# Patient Record
Sex: Female | Born: 1955 | Race: White | Hispanic: No | Marital: Married | Smoking: Never smoker
Health system: Southern US, Community
[De-identification: ages and names within clinical notes are randomized; demographics above are authoritative.]

## PROBLEM LIST (undated history)

## (undated) DIAGNOSIS — K219 Gastro-esophageal reflux disease without esophagitis: Secondary | ICD-10-CM

## (undated) DIAGNOSIS — Z8042 Family history of malignant neoplasm of prostate: Secondary | ICD-10-CM

## (undated) DIAGNOSIS — Z87442 Personal history of urinary calculi: Secondary | ICD-10-CM

## (undated) DIAGNOSIS — IMO0001 Reserved for inherently not codable concepts without codable children: Secondary | ICD-10-CM

## (undated) DIAGNOSIS — D649 Anemia, unspecified: Secondary | ICD-10-CM

## (undated) DIAGNOSIS — Z86010 Personal history of colonic polyps: Secondary | ICD-10-CM

## (undated) DIAGNOSIS — Z801 Family history of malignant neoplasm of trachea, bronchus and lung: Secondary | ICD-10-CM

## (undated) DIAGNOSIS — N200 Calculus of kidney: Secondary | ICD-10-CM

## (undated) DIAGNOSIS — N840 Polyp of corpus uteri: Secondary | ICD-10-CM

## (undated) DIAGNOSIS — K589 Irritable bowel syndrome without diarrhea: Secondary | ICD-10-CM

## (undated) DIAGNOSIS — G4730 Sleep apnea, unspecified: Secondary | ICD-10-CM

## (undated) DIAGNOSIS — Z8052 Family history of malignant neoplasm of bladder: Secondary | ICD-10-CM

## (undated) DIAGNOSIS — N393 Stress incontinence (female) (male): Secondary | ICD-10-CM

## (undated) DIAGNOSIS — R0600 Dyspnea, unspecified: Secondary | ICD-10-CM

## (undated) DIAGNOSIS — J310 Chronic rhinitis: Secondary | ICD-10-CM

## (undated) DIAGNOSIS — Z800 Family history of malignant neoplasm of digestive organs: Secondary | ICD-10-CM

## (undated) DIAGNOSIS — F329 Major depressive disorder, single episode, unspecified: Secondary | ICD-10-CM

## (undated) DIAGNOSIS — T7840XA Allergy, unspecified, initial encounter: Secondary | ICD-10-CM

## (undated) DIAGNOSIS — G4733 Obstructive sleep apnea (adult) (pediatric): Secondary | ICD-10-CM

## (undated) DIAGNOSIS — E785 Hyperlipidemia, unspecified: Secondary | ICD-10-CM

## (undated) DIAGNOSIS — R011 Cardiac murmur, unspecified: Secondary | ICD-10-CM

## (undated) DIAGNOSIS — N939 Abnormal uterine and vaginal bleeding, unspecified: Secondary | ICD-10-CM

## (undated) DIAGNOSIS — Z860101 Personal history of adenomatous and serrated colon polyps: Secondary | ICD-10-CM

## (undated) DIAGNOSIS — Z803 Family history of malignant neoplasm of breast: Secondary | ICD-10-CM

## (undated) DIAGNOSIS — R0609 Other forms of dyspnea: Secondary | ICD-10-CM

## (undated) DIAGNOSIS — F32A Depression, unspecified: Secondary | ICD-10-CM

## (undated) DIAGNOSIS — Z87898 Personal history of other specified conditions: Secondary | ICD-10-CM

## (undated) DIAGNOSIS — R569 Unspecified convulsions: Secondary | ICD-10-CM

## (undated) DIAGNOSIS — F419 Anxiety disorder, unspecified: Secondary | ICD-10-CM

## (undated) DIAGNOSIS — Z531 Procedure and treatment not carried out because of patient's decision for reasons of belief and group pressure: Secondary | ICD-10-CM

## (undated) DIAGNOSIS — I10 Essential (primary) hypertension: Secondary | ICD-10-CM

## (undated) DIAGNOSIS — R32 Unspecified urinary incontinence: Secondary | ICD-10-CM

## (undated) DIAGNOSIS — Z9989 Dependence on other enabling machines and devices: Secondary | ICD-10-CM

## (undated) DIAGNOSIS — R519 Headache, unspecified: Secondary | ICD-10-CM

## (undated) DIAGNOSIS — N950 Postmenopausal bleeding: Secondary | ICD-10-CM

## (undated) DIAGNOSIS — I440 Atrioventricular block, first degree: Secondary | ICD-10-CM

## (undated) HISTORY — DX: Family history of malignant neoplasm of bladder: Z80.52

## (undated) HISTORY — DX: Family history of malignant neoplasm of trachea, bronchus and lung: Z80.1

## (undated) HISTORY — DX: Family history of malignant neoplasm of digestive organs: Z80.0

## (undated) HISTORY — DX: Gastro-esophageal reflux disease without esophagitis: K21.9

## (undated) HISTORY — DX: Allergy, unspecified, initial encounter: T78.40XA

## (undated) HISTORY — DX: Cardiac murmur, unspecified: R01.1

## (undated) HISTORY — DX: Hyperlipidemia, unspecified: E78.5

## (undated) HISTORY — PX: CHOLECYSTECTOMY: SHX55

## (undated) HISTORY — DX: Anxiety disorder, unspecified: F41.9

## (undated) HISTORY — DX: Abnormal uterine and vaginal bleeding, unspecified: N93.9

## (undated) HISTORY — DX: Unspecified convulsions: R56.9

## (undated) HISTORY — DX: Family history of malignant neoplasm of breast: Z80.3

## (undated) HISTORY — PX: COLON SURGERY: SHX602

## (undated) HISTORY — DX: Depression, unspecified: F32.A

## (undated) HISTORY — PX: APPENDECTOMY: SHX54

## (undated) HISTORY — DX: Calculus of kidney: N20.0

## (undated) HISTORY — DX: Anemia, unspecified: D64.9

## (undated) HISTORY — DX: Sleep apnea, unspecified: G47.30

## (undated) HISTORY — DX: Major depressive disorder, single episode, unspecified: F32.9

## (undated) HISTORY — PX: CYSTOSCOPY/RETROGRADE/URETEROSCOPY: SHX5316

## (undated) HISTORY — PX: LAPAROSCOPIC ILEOCECECTOMY: SHX5898

## (undated) HISTORY — DX: Family history of malignant neoplasm of prostate: Z80.42

## (undated) HISTORY — DX: Irritable bowel syndrome, unspecified: K58.9

## (undated) HISTORY — PX: VENTRAL HERNIA REPAIR: SHX424

## (undated) HISTORY — PX: HERNIA REPAIR: SHX51

## (undated) HISTORY — DX: Unspecified urinary incontinence: R32

## (undated) HISTORY — PX: CYSTOSCOPY/URETEROSCOPY/HOLMIUM LASER/STENT PLACEMENT: SHX6546

---
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
## 1986-01-13 HISTORY — PX: OVARIAN CYST SURGERY: SHX726

## 1998-08-31 ENCOUNTER — Ambulatory Visit: Admission: RE | Admit: 1998-08-31 | Discharge: 1998-08-31 | Payer: Self-pay | Admitting: Internal Medicine

## 2008-02-01 ENCOUNTER — Ambulatory Visit (HOSPITAL_COMMUNITY): Admission: RE | Admit: 2008-02-01 | Discharge: 2008-02-01 | Payer: Self-pay | Admitting: Urology

## 2010-04-29 LAB — BASIC METABOLIC PANEL
BUN: 12 mg/dL (ref 6–23)
CO2: 21 mEq/L (ref 19–32)
Chloride: 110 mEq/L (ref 96–112)
Glucose, Bld: 80 mg/dL (ref 70–99)
Potassium: 4.7 mEq/L (ref 3.5–5.1)

## 2010-04-29 LAB — HCG, QUANTITATIVE, PREGNANCY: hCG, Beta Chain, Quant, S: <2 m[IU]/mL (ref <5)

## 2010-05-28 NOTE — Consult Note (Signed)
NAME:  Sherri Howard, BELFIORE NO.:  [id number]   MEDICAL RECORD NO.:  [id number]          PATIENT TYPE:  AMB   LOCATION:  DAY                           FACILITY:  [facility]   PHYSICIAN:  Ky Barban, M.D.DATE OF BIRTH:  10/28/1955   DATE OF CONSULTATION:  DATE OF DISCHARGE:                                 CONSULTATION   A 55 year old female is coming tomorrow to have a right ureteral stone  basket done under anesthesia as outpatient.   HISTORY:  This patient is 55 year old was seen initially by me in  December 14, 2007 with history of right renal colic.  A CT scan was done  on December 7 and it shows there is no upper ureteral calculi but there  is a 7 mm calculus in the right kidney with multiple small renal  calculi.  Apparently she had ESL done previously and these are stones  left from that ESL.  When she presented with right renal colic and the  CT which was done on December 20, 2007 the pelvic CT, showed there is a 3  x 6 mm stone in the right ureterovesical junction.  The patient has  recurrent urinary tract infection with positive urine cultures.  At this  time the urine culture were positive also.  Put her on Cipro and  subsequently she called and she is having itching because of Cipro.  I  have discontinued that, put her on Macrobid 1 twice a day and when she  comes on for the stone basket on January 19 we will give her  prophylactic antibiotic of gentamicin and Ancef.  I have told her that  we need to go ahead and basket the stone and she understands the  procedure, limitations, complications, especially use of double-J stent,  need for additional procedure, possible open surgery.   PAST MEDICAL HISTORY:  She has hypertension.  No diabetes ESL right  kidney 2 years ago.   FAMILY HISTORY:  Father had bladder cancer and mother had breast cancer.   PERSONAL HISTORY:  Does not smoke or drink.   REVIEW OF SYSTEMS:  Unremarkable.   EXAMINATION:   CONSTITUTIONAL:  Moderately built, not in acute distress.  VITAL SIGNS:  Blood pressure 166/103, temperature 98.4.  CENTRAL NERVOUS SYSTEM:  Negative.  ENT:  Negative.  CHEST:  Symmetrical.  HEART:  Regular sinus rhythm, no murmur.  ABDOMINAL:  Soft, flat.  Liver, spleen, kidneys are not palpable.  1+  right CVA tenderness.  PELVIC:  No adnexal mass or tenderness.   IMPRESSION:  1. Right distal ureteral calculus.  2. Right renal calculi.   PLAN:  Cystoscopy, right retrograde pyelogram, ureteroscopic stone  basket extraction, holmium laser lithotripsy, insertion double-J stent  under anesthesia.  It will be done as outpatient, but when she comes to  the holding area, she need to have a preop antibiotic gentamicin 80 mg  and an 7 grams IV.   ALLERGIES:  The patient is allergic to SULFA DRUGS, CIPRO and VISTARIL.      Ky Barban, M.D.  Electronically Signed     MIJ/MEDQ  D:  01/31/2008  T:  01/31/2008  Job:  [id number]

## 2010-05-28 NOTE — Op Note (Signed)
NAME:  Sherri Howard, Sherri Howard              ACCOUNT NO.:  [id number]   MEDICAL RECORD NO.:  [id number]          PATIENT TYPE:  AMB   LOCATION:  DAY                           FACILITY:  [facility]   PHYSICIAN:  Ky Barban, M.D.DATE OF BIRTH:  [birth year]   DATE OF PROCEDURE:  DATE OF DISCHARGE:                               OPERATIVE REPORT   PREOPERATIVE DIAGNOSIS:  Right ureteral calculus.   POSTOPERATIVE DIAGNOSIS:  No ureteral calculus.   PROCEDURES:  Cystoscopy, right retrograde pyelogram, and right  ureteroscopy.   ANESTHESIA:  General endotracheal.   PROCEDURE:  The patient under general endotracheal anesthesia in  lithotomy position, prepped and draped.  A #25 cystoscope introduced  into the bladder.  The bladder looked normal and right ureteral orifice  looked normal.  It was catheterized with the wedge catheter.  Hypaque  was injected under fluoroscopic control.  The dye went up into the upper  ureter without any obstruction or any filling defect.  The ureter was of  normal caliber.  The collecting system looked normal and it looked to me  that she did not have any stone, but I decided to into the  ureterovesical junction, so guidewire was passed up into the renal  pelvis.  Over the guidewire, short rigid ureteroscope was introduced  without dilating the ureter, I could look into the intramural ureter and  the distal ureter, there was no stone.  All the instruments and  guidewire were removed.  The patient left the operating room in  satisfactory condition.      Ky Barban, M.D.  Electronically Signed     MIJ/MEDQ  D:  02/01/2008  T:  02/02/2008  Job:  [id number]

## 2012-01-14 HISTORY — PX: LAPAROSCOPIC CHOLECYSTECTOMY: SUR755

## 2012-07-21 DIAGNOSIS — D126 Benign neoplasm of colon, unspecified: Secondary | ICD-10-CM | POA: Insufficient documentation

## 2012-07-21 DIAGNOSIS — Z87442 Personal history of urinary calculi: Secondary | ICD-10-CM | POA: Insufficient documentation

## 2012-09-07 DIAGNOSIS — Z9049 Acquired absence of other specified parts of digestive tract: Secondary | ICD-10-CM | POA: Insufficient documentation

## 2014-02-10 ENCOUNTER — Encounter: Payer: Self-pay | Admitting: Internal Medicine

## 2014-06-16 DIAGNOSIS — K432 Incisional hernia without obstruction or gangrene: Secondary | ICD-10-CM | POA: Insufficient documentation

## 2014-07-31 DIAGNOSIS — Z8719 Personal history of other diseases of the digestive system: Secondary | ICD-10-CM | POA: Insufficient documentation

## 2014-07-31 DIAGNOSIS — Z9889 Other specified postprocedural states: Secondary | ICD-10-CM | POA: Insufficient documentation

## 2014-07-31 HISTORY — DX: Personal history of other diseases of the digestive system: Z87.19

## 2014-08-22 ENCOUNTER — Encounter (HOSPITAL_COMMUNITY): Payer: Self-pay | Admitting: Emergency Medicine

## 2014-08-22 ENCOUNTER — Emergency Department (HOSPITAL_COMMUNITY)
Admission: EM | Admit: 2014-08-22 | Discharge: 2014-08-23 | Disposition: A | Discharge status: Home / Self Care | Payer: BLUE CROSS/BLUE SHIELD | Attending: Emergency Medicine | Admitting: Emergency Medicine

## 2014-08-22 ENCOUNTER — Emergency Department (HOSPITAL_COMMUNITY): Payer: BLUE CROSS/BLUE SHIELD

## 2014-08-22 DIAGNOSIS — T380X1A Poisoning by glucocorticoids and synthetic analogues, accidental (unintentional), initial encounter: Secondary | ICD-10-CM | POA: Diagnosis not present

## 2014-08-22 DIAGNOSIS — I10 Essential (primary) hypertension: Secondary | ICD-10-CM | POA: Insufficient documentation

## 2014-08-22 DIAGNOSIS — Z91040 Latex allergy status: Secondary | ICD-10-CM | POA: Insufficient documentation

## 2014-08-22 DIAGNOSIS — Y929 Unspecified place or not applicable: Secondary | ICD-10-CM | POA: Insufficient documentation

## 2014-08-22 DIAGNOSIS — Y999 Unspecified external cause status: Secondary | ICD-10-CM | POA: Insufficient documentation

## 2014-08-22 DIAGNOSIS — Y939 Activity, unspecified: Secondary | ICD-10-CM | POA: Insufficient documentation

## 2014-08-22 DIAGNOSIS — T887XXA Unspecified adverse effect of drug or medicament, initial encounter: Secondary | ICD-10-CM

## 2014-08-22 HISTORY — DX: Essential (primary) hypertension: I10

## 2014-08-22 LAB — CBC WITH DIFFERENTIAL/PLATELET
BASOS PCT: 0 % (ref 0–1)
Basophils Absolute: 0 10*3/uL (ref 0.0–0.1)
EOS PCT: 0 % (ref 0–5)
Eosinophils Absolute: 0 10*3/uL (ref 0.0–0.7)
HCT: 41.9 % (ref 36.0–46.0)
Hemoglobin: 13.9 g/dL (ref 12.0–15.0)
Lymphocytes Relative: 12 % (ref 12–46)
Lymphs Abs: 1.7 10*3/uL (ref 0.7–4.0)
MCH: 29.4 pg (ref 26.0–34.0)
MCHC: 33.2 g/dL (ref 30.0–36.0)
MCV: 88.6 fL (ref 78.0–100.0)
Monocytes Absolute: 1 10*3/uL (ref 0.1–1.0)
Monocytes Relative: 7 % (ref 3–12)
NEUTROS ABS: 11.8 10*3/uL — AB (ref 1.7–7.7)
NEUTROS PCT: 81 % — AB (ref 43–77)
PLATELETS: 405 10*3/uL — AB (ref 150–400)
RBC: 4.73 MIL/uL (ref 3.87–5.11)
RDW: 13.6 % (ref 11.5–15.5)
WBC: 14.4 10*3/uL — AB (ref 4.0–10.5)

## 2014-08-22 NOTE — ED Notes (Signed)
Pt states she zoned out and husband states she had an episode of shaking.

## 2014-08-22 NOTE — ED Provider Notes (Signed)
CSN: [id number]     Arrival date & time 08/22/14  2309 History   First MD Initiated Contact with Patient 08/22/14 2314     Chief Complaint  Patient presents with  . Seizures     (Consider location/radiation/quality/duration/timing/severity/associated sxs/prior Treatment) HPI Comments: Patient is a 59 year old female presents to the emergency department by EMS after having "disowned out".  The history is obtained from the husband, EMS, and the patient.  The history is given that the patient was sitting in the room with her husband they were conversing when the patient "zoned out" and then had some shaking.  The patient states that at this time she is not hurting. She is aware of where she is, as well as the date, and who she is. She denies any headache or any pain of any kind. She denies any chest pain or palpitations. She denies any unusual weakness of extremities. She states that she has not had any high fever recently. It is of note that she recently had hernia surgery. She states however she has not really had any problems related to the surgery. His been no recent chills or noted fever. No recent vomiting or diarrhea. Patient denies any injury to the head or any accident.  Patient is a 59 y.o. female presenting with seizures. The history is provided by the patient.  Seizures   Past Medical History  Diagnosis Date  . Hypertension    Past Surgical History  Procedure Laterality Date  . Hernia repair     History reviewed. No pertinent family history. History  Substance Use Topics  . Smoking status: Never Smoker   . Smokeless tobacco: Not on file  . Alcohol Use: No   OB History    No data available     Review of Systems  Constitutional: Negative for activity change.       All ROS Neg except as noted in HPI  HENT: Negative for nosebleeds.   Eyes: Negative for photophobia and discharge.  Respiratory: Negative for cough, shortness of breath and wheezing.   Cardiovascular:  Negative for chest pain and palpitations.  Gastrointestinal: Negative for abdominal pain and blood in stool.  Genitourinary: Negative for dysuria, frequency and hematuria.  Musculoskeletal: Negative for back pain, arthralgias and neck pain.  Skin: Negative.   Neurological: Negative for dizziness, seizures and speech difficulty.  Psychiatric/Behavioral: Negative for hallucinations and confusion.      Allergies  Latex and Sulfa antibiotics  Home Medications   Prior to Admission medications   Not on File   BP 140/74 mmHg  Pulse 97  Temp(Src) 98.9 F (37.2 C) (Oral)  Resp 20  Ht 5\' 5"  (1.651 m)  Wt 240 lb (108.863 kg)  BMI 39.94 kg/m2  SpO2 95% Physical Exam  Constitutional: She is oriented to person, place, and time. She appears well-developed and well-nourished.  Non-toxic appearance.  HENT:  Head: Normocephalic.  Right Ear: Tympanic membrane and external ear normal.  Left Ear: Tympanic membrane and external ear normal.  Eyes: EOM and lids are normal. Pupils are equal, round, and reactive to light.  Neck: Normal range of motion. Neck supple. Carotid bruit is not present.  Cardiovascular: Normal rate, regular rhythm, normal heart sounds, intact distal pulses and normal pulses.   Pulmonary/Chest: Breath sounds normal. No respiratory distress.  Abdominal: Soft. Bowel sounds are normal. There is no guarding.  There is a dressing over the surgical site. There is an abdominal binder also present.  Musculoskeletal: Normal range of motion.  Lymphadenopathy:       Head (right side): No submandibular adenopathy present.       Head (left side): No submandibular adenopathy present.    She has no cervical adenopathy.  Neurological: She is alert and oriented to person, place, and time. She has normal strength. No cranial nerve deficit or sensory deficit.  Skin: Skin is warm and dry.  Psychiatric: She has a normal mood and affect. Her speech is normal. Judgment and thought content  normal.  Nursing note and vitals reviewed.   ED Course  Procedures (including critical care time) Labs Review Labs Reviewed  URINALYSIS, ROUTINE W REFLEX MICROSCOPIC (NOT AT [facility])  COMPREHENSIVE METABOLIC PANEL  TROPONIN I  CBC WITH DIFFERENTIAL/PLATELET    Imaging Review No results found.   EKG Interpretation None      MDM  Vital signs are well within normal limits. Urinalysis shows a small leukocyte esterase, with 11-20 WBCs present. Many bacteria, and many squamous cells present. The lactic acid was negative at 1.7. The components of metabolic panel showed no acute changes. The troponin was less than 0.03, negative for acute cardiac event. The white blood cell count was elevated at 14,400. The patient has been on stimulant medication over the last 3 days. The hemoglobin and hematocrit were steady. There was no noted shift to the left. The chest x-ray showed the lungs to be hyperexpanded, but otherwise negative. Review of the surgical dressing and surgical site showed no evidence of any major infection.  I suspect the symptoms are probably related to an adverse reaction to prednisone stimulant. I have asked the patient to stop sterile now. And to discuss this with her primary physician. I further instructed the patient and the patient's husband to return immediately if any changes, problems, or concerns. Questions were answered. The patient is in agreement with this discharge plan.    Final diagnoses:  None    *I have reviewed nursing notes, vital signs, and all appropriate lab and imaging results for this patient.Lily Kocher, PA-C 08/24/14 [place], MD 08/26/14 2356

## 2014-08-23 LAB — URINALYSIS, ROUTINE W REFLEX MICROSCOPIC
BILIRUBIN URINE: NEGATIVE
GLUCOSE, UA: NEGATIVE mg/dL
KETONES UR: NEGATIVE mg/dL
Nitrite: NEGATIVE
PROTEIN: NEGATIVE mg/dL
SPECIFIC GRAVITY, URINE: 1.015 (ref 1.005–1.030)
Urobilinogen, UA: 0.2 mg/dL (ref 0.0–1.0)
pH: 6.5 (ref 5.0–8.0)

## 2014-08-23 LAB — COMPREHENSIVE METABOLIC PANEL
ALBUMIN: 4 g/dL (ref 3.5–5.0)
ALT: 37 U/L (ref 14–54)
AST: 26 U/L (ref 15–41)
Alkaline Phosphatase: 75 U/L (ref 38–126)
Anion gap: 10 (ref 5–15)
BUN: 19 mg/dL (ref 6–20)
CHLORIDE: 105 mmol/L (ref 101–111)
CO2: 20 mmol/L — ABNORMAL LOW (ref 22–32)
Calcium: 9.3 mg/dL (ref 8.9–10.3)
Creatinine, Ser: 0.95 mg/dL (ref 0.44–1.00)
GFR calc Af Amer: >60 mL/min (ref >60)
Glucose, Bld: 128 mg/dL — ABNORMAL HIGH (ref 65–99)
Potassium: 4 mmol/L (ref 3.5–5.1)
SODIUM: 135 mmol/L (ref 135–145)
Total Bilirubin: 0.2 mg/dL — ABNORMAL LOW (ref 0.3–1.2)
Total Protein: 7.2 g/dL (ref 6.5–8.1)

## 2014-08-23 LAB — TROPONIN I

## 2014-08-23 LAB — URINE MICROSCOPIC-ADD ON

## 2014-08-23 LAB — LACTIC ACID, PLASMA: Lactic Acid, Venous: 1.7 mmol/L (ref 0.5–2.0)

## 2014-08-23 NOTE — Discharge Instructions (Signed)
Your vital signs are within normal limits. Your lab test are non-acute at this time. A culture of your urine will be sent to the lab for additional testiing. Suspect adverse reaction to you steroid medication.  Please stop your prednisone for now. Please discuss today's symptoms with your MD as soon as possible. Please return to the ED if any changes or concerns.

## 2014-08-23 NOTE — ED Notes (Signed)
Pt alert & oriented x4, stable gait. Patient  given discharge instructions, paperwork & prescription(s).  Patient verbalized understanding. Pt left department in wheelchair w/ no further questions. 

## 2014-08-23 NOTE — ED Notes (Signed)
Pt ambulated to restroom & returned to room w/ no complications. 

## 2014-12-20 DIAGNOSIS — Z6841 Body Mass Index (BMI) 40.0 and over, adult: Secondary | ICD-10-CM | POA: Insufficient documentation

## 2014-12-20 DIAGNOSIS — E739 Lactose intolerance, unspecified: Secondary | ICD-10-CM | POA: Insufficient documentation

## 2014-12-20 DIAGNOSIS — R32 Unspecified urinary incontinence: Secondary | ICD-10-CM | POA: Insufficient documentation

## 2014-12-20 DIAGNOSIS — G4700 Insomnia, unspecified: Secondary | ICD-10-CM | POA: Insufficient documentation

## 2014-12-20 DIAGNOSIS — I10 Essential (primary) hypertension: Secondary | ICD-10-CM | POA: Insufficient documentation

## 2014-12-20 DIAGNOSIS — Z9989 Dependence on other enabling machines and devices: Secondary | ICD-10-CM

## 2014-12-20 DIAGNOSIS — G4733 Obstructive sleep apnea (adult) (pediatric): Secondary | ICD-10-CM | POA: Insufficient documentation

## 2014-12-20 DIAGNOSIS — F411 Generalized anxiety disorder: Secondary | ICD-10-CM | POA: Insufficient documentation

## 2014-12-20 DIAGNOSIS — F339 Major depressive disorder, recurrent, unspecified: Secondary | ICD-10-CM | POA: Insufficient documentation

## 2014-12-20 DIAGNOSIS — E785 Hyperlipidemia, unspecified: Secondary | ICD-10-CM | POA: Insufficient documentation

## 2014-12-20 DIAGNOSIS — E559 Vitamin D deficiency, unspecified: Secondary | ICD-10-CM | POA: Insufficient documentation

## 2014-12-20 DIAGNOSIS — K589 Irritable bowel syndrome without diarrhea: Secondary | ICD-10-CM | POA: Insufficient documentation

## 2015-03-02 DIAGNOSIS — R0609 Other forms of dyspnea: Secondary | ICD-10-CM | POA: Insufficient documentation

## 2015-03-02 DIAGNOSIS — R0600 Dyspnea, unspecified: Secondary | ICD-10-CM | POA: Insufficient documentation

## 2015-03-02 DIAGNOSIS — R109 Unspecified abdominal pain: Secondary | ICD-10-CM | POA: Insufficient documentation

## 2015-04-23 DIAGNOSIS — G4733 Obstructive sleep apnea (adult) (pediatric): Secondary | ICD-10-CM | POA: Diagnosis not present

## 2015-05-03 DIAGNOSIS — H35363 Drusen (degenerative) of macula, bilateral: Secondary | ICD-10-CM | POA: Diagnosis not present

## 2015-05-23 DIAGNOSIS — G4733 Obstructive sleep apnea (adult) (pediatric): Secondary | ICD-10-CM | POA: Diagnosis not present

## 2015-06-04 ENCOUNTER — Encounter: Payer: Self-pay | Admitting: Family Medicine

## 2015-06-04 ENCOUNTER — Ambulatory Visit (INDEPENDENT_AMBULATORY_CARE_PROVIDER_SITE_OTHER): Payer: BLUE CROSS/BLUE SHIELD | Admitting: Family Medicine

## 2015-06-04 ENCOUNTER — Ambulatory Visit (INDEPENDENT_AMBULATORY_CARE_PROVIDER_SITE_OTHER): Payer: BLUE CROSS/BLUE SHIELD

## 2015-06-04 VITALS — BP 133/74 | HR 98 | Temp 101.2°F | Ht 65.0 in | Wt 260.0 lb

## 2015-06-04 DIAGNOSIS — R0600 Dyspnea, unspecified: Secondary | ICD-10-CM | POA: Diagnosis not present

## 2015-06-04 DIAGNOSIS — R509 Fever, unspecified: Secondary | ICD-10-CM | POA: Diagnosis not present

## 2015-06-04 DIAGNOSIS — F329 Major depressive disorder, single episode, unspecified: Secondary | ICD-10-CM

## 2015-06-04 DIAGNOSIS — N10 Acute tubulo-interstitial nephritis: Secondary | ICD-10-CM

## 2015-06-04 DIAGNOSIS — F32A Depression, unspecified: Secondary | ICD-10-CM

## 2015-06-04 LAB — MICROSCOPIC EXAMINATION: WBC, UA: >30 /hpf — AB (ref 0–?)

## 2015-06-04 LAB — URINALYSIS, COMPLETE
BILIRUBIN UA: NEGATIVE
GLUCOSE, UA: NEGATIVE
NITRITE UA: NEGATIVE
SPEC GRAV UA: 1.01 (ref 1.005–1.030)
UUROB: 0.2 mg/dL (ref 0.2–1.0)
pH, UA: 5.5 (ref 5.0–7.5)

## 2015-06-04 MED ORDER — LEVOFLOXACIN 500 MG PO TABS: 500.0000 mg | ORAL_TABLET | Freq: Every day | ORAL | Status: DC

## 2015-06-04 NOTE — Progress Notes (Signed)
Subjective:  [name], female    DOB: 1955-01-28  Age: 60 y.o. MRN: [id number]  CC: Establish Care and Fever   HPI Sherri Howard presents for  Fever of 101-102 intermittently for the last 14 days. She's had temporary relief with Tylenol and Advil. However the fever yesterday when up to 102.7. She feels achy and weak all over. She has no specific symptoms to localize the temperature. The one exception is that she has noted some loss of control of her urination. For this she takes oxybutynin once or twice daily for her bladder. She has a history of kidney stones. She is also having some low back pain. It is in a band across the back which she describes as just a pain. She denies any shortness. There is no cramp to it. It is equal bilaterally. It is 5-6/10. It has not affected her activity level. It has been accompanied by some shortness of breath. However she has had no cough. She has no history of COPD or asthma. She is a nonsmoker. She denies any upper respiratory symptoms such as earache sore throat congestion rhinorrhea. Of note is that she does have a long history of depression for many years. She was recently taken off of her Prozac and Wellbutrin and placed on Viibryd. She mentions having missed a couple of days last week and it caused her to have a shocklike sensation in her head. This resolved once going back on the [place]. She takes Abilify 2 mg daily. She says it doesn't seem to be helping anymore for her depression. She denies a history of schizophrenia and manic depressive disorder etc. She is pretty sure the reason that she was placed on this medicine was for her chronic depression. She has chronic abdominal discomfort that is temporarily relieved by hyoscyamine sublingual tablets. It has not been worse than usual recently. But it is intermittently active.  She mentions also that she uses CPAP for sleep apnea. She notes that when she's been asleep she's felt like she is was  trying to wake up but physically could not move her body. Some unclear exactly how long this lasts for her.  Depression screen [facility] 2/9 06/04/2015  Decreased Interest 0  Down, Depressed, Hopeless 1  PHQ - 2 Score 1    No flowsheet data found.     History Aili has a past medical history of Hypertension and Kidney stones.   She has past surgical history that includes Hernia repair; Tonsillectomy (05/1973); Cholecystectomy; and Appendectomy.   Her family history includes Alzheimer's disease in her father; Aneurysm in her mother; Cancer in her father and mother; Hypertension in her father.She reports that she has never smoked. She does not have any smokeless tobacco history on file. She reports that she does not drink alcohol or use illicit drugs.  No current outpatient prescriptions on file prior to visit.   No current facility-administered medications on file prior to visit.    ROS Review of Systems  Constitutional: Negative for fever, activity change and appetite change.  HENT: Negative for congestion, rhinorrhea and sore throat.   Eyes: Negative for visual disturbance.  Respiratory: Negative for cough and shortness of breath.   Cardiovascular: Negative for chest pain and palpitations.  Gastrointestinal: Negative for nausea, abdominal pain and diarrhea.  Genitourinary: Negative for dysuria.  Musculoskeletal: Negative for myalgias and arthralgias.    Objective:  BP 133/74 mmHg  Pulse 98  Temp(Src) 101.2 F (38.4 C) (Oral)  Ht  _0  (1.651 m)  Wt 260 lb (117.935 kg)  BMI 43.27 kg/m2  SpO2 98%  Physical Exam  Constitutional: She is oriented to person, place, and time. She appears well-developed and well-nourished. No distress.  HENT:  Head: Normocephalic and atraumatic.  Right Ear: External ear normal.  Left Ear: External ear normal.  Nose: Nose normal.  Mouth/Throat: Oropharynx is clear and moist.  Eyes: Conjunctivae and EOM are normal. Pupils are equal, round, and  reactive to light.  Neck: Normal range of motion. Neck supple. No thyromegaly present.  Cardiovascular: Normal rate, regular rhythm and normal heart sounds.   No murmur heard. Pulmonary/Chest: Effort normal and breath sounds normal. No respiratory distress. She has no wheezes. She has no rales.  Abdominal: Soft. Bowel sounds are normal. She exhibits no distension. There is no tenderness.  Lymphadenopathy:    She has no cervical adenopathy.  Neurological: She is alert and oriented to person, place, and time. She has normal reflexes.  Skin: Skin is warm and dry.  Psychiatric: She has a normal mood and affect. Her behavior is normal. Judgment and thought content normal.    Assessment & Plan:   Anglea was seen today for establish care and fever.  Diagnoses and all orders for this visit:  Acute pyelonephritis -     US Renal; Future  Fever, unspecified fever cause -     Urinalysis, Complete -     CBC with Differential/Platelet -     CMP14+EGFR -     Sedimentation rate -     TSH + free T4 -     Amylase -     Lipase -     DG Abd 2 Views; Future -     DG Chest 2 View; Future -     PR BREATHING CAPACITY TEST -     US Renal; Future  Dyspnea -     US Renal; Future  Depression -     US Renal; Future  Other orders -     levofloxacin (LEVAQUIN) 500 MG tablet; Take 1 tablet (500 mg total) by mouth daily. For 10 days -     Microscopic Examination   I am having Ms. Clodfelter start on levofloxacin. I am also having her maintain her ARIPiprazole, Cholecalciferol, folic acid, HYDROcodone-acetaminophen, hyoscyamine, losartan, PRENATAL LOW IRON, Vilazodone HCl, diltiazem, Co Q-10, docusate sodium, magnesium oxide, ALPRAZolam, and oxybutynin.  Meds ordered this encounter  Medications  . ARIPiprazole (ABILIFY) 2 MG tablet    Sig: Take 2 mg by mouth daily.   Marland Kitchen DISCONTD: ALPRAZolam (XANAX) 1 MG tablet    Sig: Take by mouth.  . Cholecalciferol (D-5000) 5000 units TABS    Sig: Take 5,000 Units  by mouth daily.   . folic acid (FOLVITE) 1 MG tablet    Sig: Take 1 mg by mouth daily.   Marland Kitchen HYDROcodone-acetaminophen (NORCO) 10-325 MG tablet    Sig: Take 1 tablet by mouth every 6 (six) hours as needed.   . hyoscyamine (LEVSIN SL) 0.125 MG SL tablet    Sig: Take 0.125 mg by mouth every 6 (six) hours as needed.   Marland Kitchen losartan (COZAAR) 50 MG tablet    Sig: Take 50 mg by mouth daily.   Marland Kitchen DISCONTD: oxybutynin (DITROPAN-XL) 10 MG 24 hr tablet    Sig: Take by mouth.  . Prenatal Vit-Fe Fumarate-FA (PRENATAL LOW IRON) 27-0.8 MG TABS    Sig: Take 1 capsule by mouth daily.   . Vilazodone HCl (VIIBRYD) 10 &  20 & 40 MG KIT    Sig: Take 20 mg by mouth daily.   Marland Kitchen diltiazem (CARDIZEM CD) 240 MG 24 hr capsule    Sig: Take 240 mg by mouth daily.   . Coenzyme Q10 (CO Q-10) 100 MG CAPS    Sig: Take 100 mg by mouth.  . docusate sodium (COLACE) 100 MG capsule    Sig: Take 100 mg by mouth daily as needed.   . magnesium oxide (MAG-OX) 400 MG tablet    Sig: Take 400 mg by mouth daily.   Marland Kitchen ALPRAZolam (XANAX) 1 MG tablet    Sig: Take 1 mg by mouth 3 (three) times daily as needed.   Marland Kitchen oxybutynin (DITROPAN-XL) 10 MG 24 hr tablet    Sig: Take 10 mg by mouth at bedtime.   Marland Kitchen levofloxacin (LEVAQUIN) 500 MG tablet    Sig: Take 1 tablet (500 mg total) by mouth daily. For 10 days    Dispense:  10 tablet    Refill:  0     Follow-up: Return in about 1 month (around 07/05/2015).  Claretta Fraise, M.D.

## 2015-06-05 ENCOUNTER — Encounter: Payer: Self-pay | Admitting: Family Medicine

## 2015-06-05 LAB — CBC WITH DIFFERENTIAL/PLATELET
BASOS: 0 %
Basophils Absolute: 0 10*3/uL (ref 0.0–0.2)
EOS (ABSOLUTE): 0.1 10*3/uL (ref 0.0–0.4)
EOS: 0 %
HEMATOCRIT: 36.4 % (ref 34.0–46.6)
HEMOGLOBIN: 12.3 g/dL (ref 11.1–15.9)
Immature Grans (Abs): 0 10*3/uL (ref 0.0–0.1)
Immature Granulocytes: 0 %
LYMPHS ABS: 1.7 10*3/uL (ref 0.7–3.1)
Lymphs: 13 %
MCH: 29.3 pg (ref 26.6–33.0)
MCHC: 33.8 g/dL (ref 31.5–35.7)
MCV: 87 fL (ref 79–97)
MONOCYTES: 12 %
MONOS ABS: 1.5 10*3/uL — AB (ref 0.1–0.9)
Neutrophils Absolute: 9.4 10*3/uL — ABNORMAL HIGH (ref 1.4–7.0)
Neutrophils: 75 %
Platelets: 311 10*3/uL (ref 150–379)
RBC: 4.2 x10E6/uL (ref 3.77–5.28)
RDW: 14.2 % (ref 12.3–15.4)
WBC: 12.6 10*3/uL — ABNORMAL HIGH (ref 3.4–10.8)

## 2015-06-05 LAB — CMP14+EGFR
A/G RATIO: 1.4 (ref 1.2–2.2)
ALBUMIN: 4.2 g/dL (ref 3.5–5.5)
ALT: 20 IU/L (ref 0–32)
AST: 13 IU/L (ref 0–40)
Alkaline Phosphatase: 79 IU/L (ref 39–117)
BUN/Creatinine Ratio: 11 (ref 9–23)
BUN: 14 mg/dL (ref 6–24)
Bilirubin Total: 0.5 mg/dL (ref 0.0–1.2)
CALCIUM: 9.4 mg/dL (ref 8.7–10.2)
CO2: 21 mmol/L (ref 18–29)
CREATININE: 1.24 mg/dL — AB (ref 0.57–1.00)
Chloride: 99 mmol/L (ref 96–106)
GFR, EST AFRICAN AMERICAN: 56 mL/min/{1.73_m2} — AB (ref >59)
GFR, EST NON AFRICAN AMERICAN: 48 mL/min/{1.73_m2} — AB (ref >59)
GLOBULIN, TOTAL: 2.9 g/dL (ref 1.5–4.5)
Glucose: 117 mg/dL — ABNORMAL HIGH (ref 65–99)
POTASSIUM: 4.7 mmol/L (ref 3.5–5.2)
SODIUM: 139 mmol/L (ref 134–144)
TOTAL PROTEIN: 7.1 g/dL (ref 6.0–8.5)

## 2015-06-05 LAB — SEDIMENTATION RATE: Sed Rate: 9 mm/hr (ref 0–40)

## 2015-06-05 LAB — LIPASE: Lipase: 10 U/L (ref 0–59)

## 2015-06-05 LAB — TSH+FREE T4
Free T4: 0.89 ng/dL (ref 0.82–1.77)
TSH: 3.06 u[IU]/mL (ref 0.450–4.500)

## 2015-06-05 LAB — AMYLASE: AMYLASE: 28 U/L — AB (ref 31–124)

## 2015-06-08 ENCOUNTER — Telehealth: Payer: Self-pay | Admitting: Family Medicine

## 2015-06-08 NOTE — Telephone Encounter (Signed)
Patient is no longer running a fever, but still weaks.  Will call back Tuesday if she is not feeling any better.   Patient aware to call back sooner if needed

## 2015-06-12 ENCOUNTER — Telehealth: Payer: Self-pay | Admitting: Family Medicine

## 2015-06-13 ENCOUNTER — Telehealth: Payer: Self-pay | Admitting: Family Medicine

## 2015-06-13 ENCOUNTER — Other Ambulatory Visit: Payer: Self-pay | Admitting: Family Medicine

## 2015-06-13 ENCOUNTER — Ambulatory Visit (HOSPITAL_COMMUNITY)
Admission: RE | Admit: 2015-06-13 | Discharge: 2015-06-13 | Disposition: A | Discharge status: Home / Self Care | Payer: BLUE CROSS/BLUE SHIELD | Source: Ambulatory Visit | Attending: Family Medicine | Admitting: Family Medicine

## 2015-06-13 ENCOUNTER — Encounter: Payer: Self-pay | Admitting: Family Medicine

## 2015-06-13 DIAGNOSIS — R0600 Dyspnea, unspecified: Secondary | ICD-10-CM

## 2015-06-13 DIAGNOSIS — F329 Major depressive disorder, single episode, unspecified: Secondary | ICD-10-CM | POA: Insufficient documentation

## 2015-06-13 DIAGNOSIS — F32A Depression, unspecified: Secondary | ICD-10-CM

## 2015-06-13 DIAGNOSIS — N10 Acute tubulo-interstitial nephritis: Secondary | ICD-10-CM

## 2015-06-13 DIAGNOSIS — N1330 Unspecified hydronephrosis: Secondary | ICD-10-CM | POA: Diagnosis not present

## 2015-06-13 DIAGNOSIS — R509 Fever, unspecified: Secondary | ICD-10-CM | POA: Insufficient documentation

## 2015-06-13 DIAGNOSIS — N281 Cyst of kidney, acquired: Secondary | ICD-10-CM | POA: Insufficient documentation

## 2015-06-13 DIAGNOSIS — N1339 Other hydronephrosis: Secondary | ICD-10-CM

## 2015-06-13 NOTE — Telephone Encounter (Signed)
Patient called and LM Thursday evening and this test is 06/13/15 in the morning  I think it will be too late to add this on

## 2015-06-18 DIAGNOSIS — R109 Unspecified abdominal pain: Secondary | ICD-10-CM | POA: Diagnosis not present

## 2015-06-18 DIAGNOSIS — N1330 Unspecified hydronephrosis: Secondary | ICD-10-CM | POA: Insufficient documentation

## 2015-06-18 DIAGNOSIS — N132 Hydronephrosis with renal and ureteral calculous obstruction: Secondary | ICD-10-CM | POA: Diagnosis not present

## 2015-06-18 DIAGNOSIS — Z87442 Personal history of urinary calculi: Secondary | ICD-10-CM | POA: Diagnosis not present

## 2015-06-23 DIAGNOSIS — G4733 Obstructive sleep apnea (adult) (pediatric): Secondary | ICD-10-CM | POA: Diagnosis not present

## 2015-06-25 ENCOUNTER — Telehealth: Payer: Self-pay | Admitting: Family Medicine

## 2015-06-25 NOTE — Telephone Encounter (Signed)
Patient called stating that she has been seeing a urologist.  Urology did a CT scan that showed Kidney Stones.  Patient states at night she has had a rapid heart beat, dyspnea and weakness with light exertion.

## 2015-06-26 ENCOUNTER — Ambulatory Visit (INDEPENDENT_AMBULATORY_CARE_PROVIDER_SITE_OTHER): Payer: BLUE CROSS/BLUE SHIELD | Admitting: Family Medicine

## 2015-06-26 ENCOUNTER — Encounter: Payer: Self-pay | Admitting: Family Medicine

## 2015-06-26 VITALS — BP 131/78 | HR 72 | Temp 97.4°F | Ht 65.0 in | Wt 260.0 lb

## 2015-06-26 DIAGNOSIS — R002 Palpitations: Secondary | ICD-10-CM

## 2015-06-26 MED ORDER — VILAZODONE HCL 10 MG PO TABS: 10.0000 mg | ORAL_TABLET | Freq: Every day | ORAL | Status: DC

## 2015-06-26 NOTE — Progress Notes (Signed)
Subjective:  [name], female    DOB: 06/13/1955  Age: 60 y.o. MRN: [id number]  CC: Palpitations and Fatigue   HPI Sherri Howard presents for  Palpitations lasting several minutes. Can occur with nausea. Denies chest pain. She does get short of breath. She describes episodes starting several months ago that have been increasing in frequency and severity and lasting longer. She came in today because of an incident 2 days ago when she was taking her shower. She started feeling nauseous and had to sit down when the palpitations started. She tried again 4 times in fact before she was able to complete her shower due to palpitations. Nausea persisting as well. She denies chest pain but she was short of breath. She just felt terrible all over. She notes that this generally happens at bedtime. However it has happened throughout the day as well. She mentions also that she uses CPAP for sleep apnea. She notes that when she's been asleep she's felt like she is was trying to wak  Depression screen [facility] 2/9 06/26/2015 06/04/2015  Decreased Interest 0 0  Down, Depressed, Hopeless 0 1  PHQ - 2 Score 0 1    GAD 7 : Generalized Anxiety Score 06/26/2015  Nervous, Anxious, on Edge 0  Control/stop worrying 0  Worry too much - different things 0  Trouble relaxing 0  Restless 0  Easily annoyed or irritable 0  Afraid - awful might happen 0  Total GAD 7 Score 0       History Sherri Howard has a past medical history of Hypertension and Kidney stones.   She has past surgical history that includes Hernia repair; Tonsillectomy (05/1973); Cholecystectomy; and Appendectomy.   Her family history includes Alzheimer's disease in her father; Aneurysm in her mother; Cancer in her father and mother; Hypertension in her father.She reports that she has never smoked. She does not have any smokeless tobacco history on file. She reports that she does not drink alcohol or use illicit drugs.  Current Outpatient  Prescriptions on File Prior to Visit  Medication Sig Dispense Refill  . ALPRAZolam (XANAX) 1 MG tablet Take 1 mg by mouth 3 (three) times daily as needed.     . diltiazem (CARDIZEM CD) 240 MG 24 hr capsule Take 240 mg by mouth daily.     Marland Kitchen losartan (COZAAR) 50 MG tablet Take 50 mg by mouth daily.      No current facility-administered medications on file prior to visit.    ROS Review of Systems  Constitutional: Negative for fever, activity change and appetite change.  HENT: Negative for congestion, rhinorrhea and sore throat.   Eyes: Negative for visual disturbance.  Respiratory: Positive for shortness of breath. Negative for cough.   Cardiovascular: Positive for palpitations. Negative for chest pain and leg swelling.  Gastrointestinal: Negative for nausea, abdominal pain and diarrhea.  Genitourinary: Negative for dysuria.  Musculoskeletal: Negative for myalgias and arthralgias.    Objective:  BP 131/78 mmHg  Pulse 72  Temp(Src) 97.4 F (36.3 C) (Oral)  Ht 5\' 5"  (1.651 m)  Wt 260 lb (117.935 kg)  BMI 43.27 kg/m2  SpO2 97%  Physical Exam  Constitutional: She is oriented to person, place, and time. She appears well-developed and well-nourished. No distress.  HENT:  Head: Normocephalic and atraumatic.  Right Ear: External ear normal.  Left Ear: External ear normal.  Nose: Nose normal.  Mouth/Throat: Oropharynx is clear and moist.  Eyes: Conjunctivae and EOM are normal. Pupils  are equal, round, and reactive to light.  Neck: Normal range of motion. Neck supple. No thyromegaly present.  Cardiovascular: Normal rate, regular rhythm and normal heart sounds.   No murmur heard. Pulmonary/Chest: Effort normal and breath sounds normal. No respiratory distress. She has no wheezes. She has no rales.  Abdominal: Soft. Bowel sounds are normal. She exhibits no distension. There is no tenderness.  Lymphadenopathy:    She has no cervical adenopathy.  Neurological: She is alert and oriented  to person, place, and time. She has normal reflexes.  Skin: Skin is warm and dry.  Psychiatric: She has a normal mood and affect. Her behavior is normal. Judgment and thought content normal.    Assessment & Plan:   Sherri Howard was seen today for palpitations and fatigue.  Diagnoses and all orders for this visit:  Palpitations -     EKG 12-Lead -     Holter monitor - 48 hour -     Holter monitor - 24 hour; Future -     Holter monitor - 24 hour  Other orders -     Vilazodone HCl (VIIBRYD) 10 MG TABS; Take 1 tablet (10 mg total) by mouth daily.   I have discontinued Sherri Howard's ARIPiprazole, Cholecalciferol, folic acid, HYDROcodone-acetaminophen, hyoscyamine, PRENATAL LOW IRON, Vilazodone HCl, Co Q-10, docusate sodium, magnesium oxide, oxybutynin, and levofloxacin. I am also having her start on Vilazodone HCl. Additionally, I am having her maintain her losartan, diltiazem, ALPRAZolam, and tamsulosin.  Meds ordered this encounter  Medications  . tamsulosin (FLOMAX) 0.4 MG CAPS capsule    Sig: Take 0.4 mg by mouth.  . Vilazodone HCl (VIIBRYD) 10 MG TABS    Sig: Take 1 tablet (10 mg total) by mouth daily.    Dispense:  30 tablet    Refill:  0     Medication List       This list is accurate as of: 06/26/15  8:01 PM.  Always use your most recent med list.               ALPRAZolam 1 MG tablet  Commonly known as:  XANAX  Take 1 mg by mouth 3 (three) times daily as needed.     diltiazem 240 MG 24 hr capsule  Commonly known as:  CARDIZEM CD  Take 240 mg by mouth daily.     FLOMAX 0.4 MG Caps capsule  Generic drug:  tamsulosin  Take 0.4 mg by mouth.     losartan 50 MG tablet  Commonly known as:  COZAAR  Take 50 mg by mouth daily.     Vilazodone HCl 10 MG Tabs  Commonly known as:  VIIBRYD  Take 1 tablet (10 mg total) by mouth daily.         Follow-up: Return in about 2 weeks (around 07/10/2015).  Claretta Fraise, M.D.

## 2015-06-27 ENCOUNTER — Encounter: Payer: Self-pay | Admitting: Family Medicine

## 2015-06-28 ENCOUNTER — Encounter: Payer: Self-pay | Admitting: Family Medicine

## 2015-06-28 ENCOUNTER — Telehealth: Payer: Self-pay | Admitting: Family Medicine

## 2015-06-28 NOTE — Telephone Encounter (Signed)
Pt stopped taking xanax and cut her viibryd in half as recommended at visit the other day. She is feeling more anxious. Should she go back on the xanax or go back to taking a whole tablet of the viibryd.

## 2015-06-28 NOTE — Telephone Encounter (Signed)
She was not told to DC xanax, I merely explained the risks of its use and asked that she work on tapering. Stopping too quickly could lead to withdrawal. She should resume at two tablets a day. Continue to take viibryd as is.

## 2015-06-29 NOTE — Telephone Encounter (Signed)
Spoke to pt and advised of MD feedback and pt voiced understanding. 

## 2015-07-04 ENCOUNTER — Telehealth: Payer: Self-pay | Admitting: Family Medicine

## 2015-07-10 DIAGNOSIS — G4733 Obstructive sleep apnea (adult) (pediatric): Secondary | ICD-10-CM | POA: Diagnosis not present

## 2015-07-23 DIAGNOSIS — G4733 Obstructive sleep apnea (adult) (pediatric): Secondary | ICD-10-CM | POA: Diagnosis not present

## 2015-07-26 DIAGNOSIS — G4733 Obstructive sleep apnea (adult) (pediatric): Secondary | ICD-10-CM | POA: Diagnosis not present

## 2015-07-27 ENCOUNTER — Encounter: Payer: Self-pay | Admitting: Family Medicine

## 2015-07-30 ENCOUNTER — Telehealth: Payer: Self-pay | Admitting: Family Medicine

## 2015-07-30 NOTE — Telephone Encounter (Signed)
lmtcb

## 2015-07-31 ENCOUNTER — Ambulatory Visit: Payer: BLUE CROSS/BLUE SHIELD | Admitting: Urology

## 2015-08-01 ENCOUNTER — Telehealth: Payer: Self-pay | Admitting: Family Medicine

## 2015-08-01 NOTE — Telephone Encounter (Signed)
Had a blockage back in may - seen a specialist and she is uncertain that she needs a UROLOGY APPT  Now ????  She seems better now. Please address and call pt on Thursday.

## 2015-08-03 NOTE — Telephone Encounter (Signed)
Her Kidney had been blocked so urine flow from kidney to bladder on that side was restricted. If she prefers, A CT scan could be done instead. (CT Urogram.) If the blockage has cleared she wouldn't need to see urology.

## 2015-08-03 NOTE — Telephone Encounter (Signed)
Per pt, she saw Dr Lupita Leash in June He is seeing pt for this Does not need new referral

## 2015-08-09 ENCOUNTER — Encounter: Payer: Self-pay | Admitting: Pediatrics

## 2015-08-09 ENCOUNTER — Ambulatory Visit (INDEPENDENT_AMBULATORY_CARE_PROVIDER_SITE_OTHER): Payer: BLUE CROSS/BLUE SHIELD | Admitting: Pediatrics

## 2015-08-09 VITALS — BP 131/80 | HR 86 | Temp 97.6°F | Ht 65.0 in | Wt 263.2 lb

## 2015-08-09 DIAGNOSIS — R5383 Other fatigue: Secondary | ICD-10-CM | POA: Insufficient documentation

## 2015-08-09 DIAGNOSIS — L821 Other seborrheic keratosis: Secondary | ICD-10-CM | POA: Diagnosis not present

## 2015-08-09 DIAGNOSIS — Z87442 Personal history of urinary calculi: Secondary | ICD-10-CM | POA: Diagnosis not present

## 2015-08-09 DIAGNOSIS — F411 Generalized anxiety disorder: Secondary | ICD-10-CM | POA: Diagnosis not present

## 2015-08-09 DIAGNOSIS — F339 Major depressive disorder, recurrent, unspecified: Secondary | ICD-10-CM

## 2015-08-09 DIAGNOSIS — I10 Essential (primary) hypertension: Secondary | ICD-10-CM

## 2015-08-09 MED ORDER — VILAZODONE HCL 20 MG PO TABS: 20.0000 mg | ORAL_TABLET | Freq: Every day | ORAL | 1 refills | Status: DC

## 2015-08-09 NOTE — Patient Instructions (Addendum)
Today's plan: Increase to 20mg  of viibryd a day Call to set up follow up with urology Call crisis line on counseling sheet if you are having any thoughts of self harm Come back in two weeks here Call to set up counselor appointment from list. Let me know if you have any trouble. I will put in referral to behavioral health. Come back in sooner if anything is getting worse.

## 2015-08-09 NOTE — Progress Notes (Signed)
Subjective:    [name], female    DOB: [birth year], 60 y.o.   MRN: [id number]  CC: Depression; Kidney problems (Sees Nephrology); and Nevus (left wrist)   HPI: Sherri Howard is a 60 y.o. female presenting for Depression; Kidney problems (Sees Nephrology); and Nevus (left wrist)  Depression: started on viibryd 5 weeks ago Usually takes it during the day Thinks it helped at first Sometimes has thoughts of cutting herself Says she wouldn't do anything permanent Has not cut herself Used to punch her stomach to hurt herself but that was years ago Was on wellbutrin and fluoxetine before Has been on several other SSRIs she thinks as well Has a lot of self hatred Has had some very bad days recently Has told her husband about how she is feeling Has a step-sister-in-law who she talks to regularly  Low back pain: uses hydrocodone 1-2 times a week as needed Still with some pain she thinks is associated with kidney stone Doesn't think she has passed the stone yet  Anxiety: uses xanax 1-2 times a week Only when she really needs it  Fatigue: ongoing problem Feels like her energy level is very low Sick two months ago from kidney stones, feels like she hasnt bounced back Several family members sick, increased stress at home  HTN: no chest pain, no headaches Taking medications regularly   GAD 7 : Generalized Anxiety Score 08/09/2015 06/26/2015  Nervous, Anxious, on Edge 1 0  Control/stop worrying 1 0  Worry too much - different things 1 0  Trouble relaxing 0 0  Restless 0 0  Easily annoyed or irritable 1 0  Afraid - awful might happen 1 0  Total GAD 7 Score 5 0  Anxiety Difficulty Somewhat difficult -     Depression screen [facility] 2/9 08/09/2015 06/26/2015 06/04/2015  Decreased Interest 2 0 0  Down, Depressed, Hopeless 3 0 1  PHQ - 2 Score 5 0 1  Altered sleeping 2 - -  Tired, decreased energy 3 - -  Change in appetite 3 - -  Feeling bad or failure about yourself   3 - -  Trouble concentrating 2 - -  Moving slowly or fidgety/restless 1 - -  Suicidal thoughts 1 - -  PHQ-9 Score 20 - -  Difficult doing work/chores Very difficult - -     Relevant past medical, surgical, family and social history reviewed and updated as indicated.  Interim medical history since our last visit reviewed. Allergies and medications reviewed and updated.  ROS: Per HPI  History  Smoking Status  . Never Smoker  Smokeless Tobacco  . Never Used       Objective:    BP (!) 153/86 (BP Location: Left Arm, Patient Position: Sitting, Cuff Size: Large) Repeat improved to 131/80  Pulse 95   Temp 97.6 F (36.4 C) (Oral)   Ht 5\' 5"  (1.651 m)   Wt 263 lb 3.2 oz (119.4 kg)   BMI 43.80 kg/m   Wt Readings from Last 3 Encounters:  08/09/15 263 lb 3.2 oz (119.4 kg)  06/26/15 260 lb (117.9 kg)  06/04/15 260 lb (117.9 kg)    Gen: NAD, alert, cooperative with exam, NCAT EYES: EOMI, no scleral injection or icterus CV: NRRR, normal S1/S2, no murmur, distal pulses 2+ b/l Resp: CTABL, no wheezes, normal WOB Ext: No edema, warm Neuro: Alert and oriented Psych: flat affect, tearful at times, normal speech pattern Skin: L forearm with 67mm flat brown  plaque, small amount of scale on surfcae     Assessment & Plan:    Trinaty was seen today for depression, kidney problems and nevus.  Diagnoses and all orders for this visit:  Benign essential hypertension Initially elevated, improved on recheck Continue current meds  Chronic recurrent major depressive disorder ([place]) Thinks viibryd started 5 weeks ago helped initially Now off of wellbutrin and fluoxetine Worsening symptoms last 2-3 weeks On lowest dose, will increase to 20mg , RTC in 2 weeks Discussed safety at home, pt does not think she would do anything to harm herself. Pt agrees to calling crisis center, our office, letting her husband know if she is having worsening thoughts. Counselor list given, crisis phone number  given -     Ambulatory referral to Psychology  Generalized anxiety disorder Take xanax rarely, 1-2 a week as needed If needing more let me know Previously receiving from her PCP Has not needed any new prescriptions from this office  History of renal calculi Follow up with urology  Other fatigue Depression likely contributing Labs relatively normal two months ago Will cont to monitor  Seborrheic keratosis Not inflamed.  Will watch for now   Follow up plan: Return in about 2 weeks (around 08/23/2015).  Assunta Found, MD [facility] 08/09/2015, 9:31 AM

## 2015-08-14 ENCOUNTER — Ambulatory Visit: Payer: BLUE CROSS/BLUE SHIELD | Admitting: Pediatrics

## 2015-08-23 DIAGNOSIS — G4733 Obstructive sleep apnea (adult) (pediatric): Secondary | ICD-10-CM | POA: Diagnosis not present

## 2015-08-24 ENCOUNTER — Encounter: Payer: Self-pay | Admitting: Pediatrics

## 2015-08-24 ENCOUNTER — Ambulatory Visit (INDEPENDENT_AMBULATORY_CARE_PROVIDER_SITE_OTHER): Payer: BLUE CROSS/BLUE SHIELD | Admitting: Pediatrics

## 2015-08-24 VITALS — BP 127/76 | HR 69 | Temp 97.1°F | Ht 65.0 in | Wt 265.0 lb

## 2015-08-24 DIAGNOSIS — M545 Low back pain, unspecified: Secondary | ICD-10-CM | POA: Insufficient documentation

## 2015-08-24 DIAGNOSIS — F339 Major depressive disorder, recurrent, unspecified: Secondary | ICD-10-CM | POA: Diagnosis not present

## 2015-08-24 DIAGNOSIS — I10 Essential (primary) hypertension: Secondary | ICD-10-CM | POA: Diagnosis not present

## 2015-08-24 MED ORDER — HYDROCODONE-ACETAMINOPHEN 5-325 MG PO TABS: 1.0000 | ORAL_TABLET | ORAL | 0 refills | Status: DC | PRN

## 2015-08-24 MED ORDER — VILAZODONE HCL 20 MG PO TABS
40.0000 mg | ORAL_TABLET | Freq: Every day | ORAL | 1 refills | Status: DC
Start: 2015-08-24 — End: 2015-09-20

## 2015-08-24 NOTE — Progress Notes (Signed)
Subjective:    [name], female    DOB: 1955-07-01, 60 y.o.   MRN: [id number]  CC: Follow-up depression  HPI: Sherri Howard is a 60 y.o. female presenting for Follow-up  No self harm or thoughts of self harm since last visit Has cut self in the past SIL very sick, has been with her in the hospital Has not yet set up counseling appt Mood slightly improved now compared to last visit  Took xanax today, viibryd  Taking 20mg  viibryd daily now  Has chronic low back pain Was previously getting hydrocodone from her PCP at other office Pain is there daily, takes the hydrocodone only on severe days, apprx 1-2 times a week Helps keep her active    Depression screen [facility] 2/9 08/24/2015 08/09/2015 06/26/2015 06/04/2015  Decreased Interest 3 2 0 0  Down, Depressed, Hopeless 3 3 0 1  PHQ - 2 Score 6 5 0 1  Altered sleeping 2 2 - -  Tired, decreased energy 3 3 - -  Change in appetite 2 3 - -  Feeling bad or failure about yourself  - 3 - -  Trouble concentrating 2 2 - -  Moving slowly or fidgety/restless 1 1 - -  Suicidal thoughts 1 1 - -  PHQ-9 Score 17 20 - -  Difficult doing work/chores Very difficult Very difficult - -     Relevant past medical, surgical, family and social history reviewed and updated. Interim medical history since our last visit reviewed. Allergies and medications reviewed and updated.  History  Smoking Status  . Never Smoker  Smokeless Tobacco  . Never Used    ROS: Per HPI      Objective:    BP 127/76   Pulse 69   Temp 97.1 F (36.2 C) (Oral)   Ht 5\' 5"  (1.651 m)   Wt 265 lb (120.2 kg)   BMI 44.10 kg/m   Wt Readings from Last 3 Encounters:  08/24/15 265 lb (120.2 kg)  08/09/15 263 lb 3.2 oz (119.4 kg)  06/26/15 260 lb (117.9 kg)     Gen: NAD, alert, cooperative with exam, NCAT EYES: EOMI, no scleral injection or icterus CV: NRRR, normal S1/S2, no murmur, distal pulses 2+ b/l Resp: CTABL, no wheezes, normal WOB Abd: +BS,  soft, NTND. no guarding or organomegaly Ext: No edema, warm Neuro: Alert and oriented, strength equal b/l UE and LE, coordination grossly normal Psych: normal affect, well groomed     Assessment & Plan:    Hollyn was seen today for follow-up multiple med problems  Diagnoses and all orders for this visit:  Benign essential hypertension Well controlled, cont losartan  Chronic recurrent major depressive disorder ([place]) Was on 20mg , increase to 40mg  daily Improved but still inadequate control Pt to call counselor to set up appt -     Vilazodone HCl 20 MG TABS; Take 2 tablets (40 mg total) by mouth daily.  Midline low back pain without sciatica Will bring pt back for initial pain visit Prescribe one time #10 tabs today Pt has been using a couple times a week, back pain worsening recently with driving to visit sister in law in the hospital Do not take at same time as benzodiazepine -     ToxASSURE Select 13 (MW), Urine -     HYDROcodone-acetaminophen (NORCO/VICODIN) 5-325 MG tablet; Take 1 tablet by mouth every 4 (four) hours as needed for moderate pain.   Follow up plan: Return in  about 4 weeks (around 09/21/2015).  Assunta Found, MD [facility] 08/24/2015, 10:59 AM

## 2015-09-07 ENCOUNTER — Encounter: Payer: Self-pay | Admitting: Pediatrics

## 2015-09-14 ENCOUNTER — Telehealth: Payer: Self-pay | Admitting: Pediatrics

## 2015-09-14 NOTE — Telephone Encounter (Signed)
Pt had missed her Viibrid yesterday and wanted to know if she should take her normal dose today. Advised pt to take her prescribed amount today. Pt voiced understanding.

## 2015-09-17 DIAGNOSIS — G4733 Obstructive sleep apnea (adult) (pediatric): Secondary | ICD-10-CM | POA: Diagnosis not present

## 2015-09-20 ENCOUNTER — Encounter: Payer: Self-pay | Admitting: Pediatrics

## 2015-09-20 ENCOUNTER — Ambulatory Visit (INDEPENDENT_AMBULATORY_CARE_PROVIDER_SITE_OTHER): Payer: BLUE CROSS/BLUE SHIELD | Admitting: Pediatrics

## 2015-09-20 VITALS — BP 128/87 | HR 85 | Temp 97.0°F | Ht 65.0 in | Wt 266.4 lb

## 2015-09-20 DIAGNOSIS — M545 Low back pain, unspecified: Secondary | ICD-10-CM

## 2015-09-20 DIAGNOSIS — Z79899 Other long term (current) drug therapy: Secondary | ICD-10-CM

## 2015-09-20 DIAGNOSIS — Z7189 Other specified counseling: Secondary | ICD-10-CM | POA: Diagnosis not present

## 2015-09-20 DIAGNOSIS — R109 Unspecified abdominal pain: Secondary | ICD-10-CM | POA: Diagnosis not present

## 2015-09-20 DIAGNOSIS — Z0289 Encounter for other administrative examinations: Secondary | ICD-10-CM | POA: Insufficient documentation

## 2015-09-20 DIAGNOSIS — I10 Essential (primary) hypertension: Secondary | ICD-10-CM

## 2015-09-20 DIAGNOSIS — Z029 Encounter for administrative examinations, unspecified: Secondary | ICD-10-CM | POA: Insufficient documentation

## 2015-09-20 DIAGNOSIS — R0602 Shortness of breath: Secondary | ICD-10-CM

## 2015-09-20 LAB — URINALYSIS, COMPLETE
Bilirubin, UA: NEGATIVE
Glucose, UA: NEGATIVE
Ketones, UA: NEGATIVE
Nitrite, UA: NEGATIVE
PH UA: 7 (ref 5.0–7.5)
Protein, UA: NEGATIVE
Specific Gravity, UA: 1.015 (ref 1.005–1.030)
Urobilinogen, Ur: 0.2 mg/dL (ref 0.2–1.0)

## 2015-09-20 LAB — MICROSCOPIC EXAMINATION: WBC, UA: >30 /hpf — AB (ref 0–?)

## 2015-09-20 MED ORDER — HYDROCODONE-ACETAMINOPHEN 5-325 MG PO TABS: 1.0000 | ORAL_TABLET | Freq: Every day | ORAL | 0 refills | Status: DC | PRN

## 2015-09-20 NOTE — Patient Instructions (Signed)
Make sure you are having regular stools while on the pain medication, let me know if you are not going every day.

## 2015-09-20 NOTE — Progress Notes (Signed)
Subjective:   [name], female    DOB: 11/20/1955, 60 y.o.   MRN: [id number] CC: Follow-up depression, narcotic contract appt  HPI: Sherri Howard is a 60 y.o. female presenting for Follow-up  Depression: Viibryd 40mg  daily now Thinks she is getting better Says depression comes and goes Up by [employer] TV or playing games on ipad up until 1-4am  SOB: Comes and goes with exertion At times has No breathing problems Had cardiac stress test done within last few years that was normal  [place] reviewed- Yes If yes- were their any concerning findings : no  [place] drug screen performed- Yes Has been several days since last hydrocodone and xanax   SOAPP  0= never  1= seldom  2=sometimes  3= often  4= very often  How often do you have mood swings? 4 How often do you smoke a cigarette within an hour after waling up? 0 How often have you taken medication other than the way that it was prescribed?0 How often have you used illegal drugs in the past 5 years? 0 How often, in your lifetime, have you had legal problems or been arrested? 0  Score 4  Alcohol Audit - How often during the last year have found that you: 0-Never   1- Less than monthly   2- Monthly     3-Weekly     4-daily or almost daily  Rare EtOH use  - found that you were not able to stop drinking once you started- 0 -failed to do what was normally expected of you because of drinking- 0 -needed a first drink in the morning- 0 -had a feeling of guilt or remorse after drinking- 0 -are/were unable to remember what happened the night before because of your drinking- 0  ( 0-7- alcohol education, 8-15- simple advice, 16-19 simple advice plus counseling, 20-40 referral for evaluation and treatment 0   Designated Pharmacy- [facility] in [place]  Pain assessment: Cause of pain- lower back pain, flank pain, kidney stones at times, other times degenerative  back pain in lower back pain, coccydynia Pain on scale of 1-10- at best pain gets to 4/10, at 8-9/10. With pain medicine brings down to 4/10 Frequency- daily What increases pain- moving sometimes, sometimes just comes  What makes pain Better- heating pad helps with pain sometimes Effects on ADL - does better on the pain medicine, up moving around more  Prior treatments tried and failed- tried ibuprofen and tylenol in the past Current treatments- hydrocodone 5mg  about 3 times a week  Pain management agreement reviewed and signed- Yes   Depression screen [facility] 2/9 09/20/2015 08/24/2015 08/09/2015 06/26/2015 06/04/2015  Decreased Interest 1 3 2  0 0  Down, Depressed, Hopeless 1 3 3  0 1  PHQ - 2 Score 2 6 5  0 1  Altered sleeping 2 2 2  - -  Tired, decreased energy 3 3 3  - -  Change in appetite 1 2 3  - -  Feeling bad or failure about yourself  2 - 3 - -  Trouble concentrating 2 2 2  - -  Moving slowly or fidgety/restless 1 1 1  - -  Suicidal thoughts 0 1 1 - -  PHQ-9 Score 13 17 20  - -  Difficult doing work/chores Somewhat difficult Very difficult Very difficult - -   GAD 7 : Generalized Anxiety Score 09/20/2015 08/24/2015 08/09/2015 06/26/2015  Nervous, Anxious, on Edge 2 1 1  0  Control/stop worrying  2 1 1  0  Worry too much - different things 2 1 1  0  Trouble relaxing 3 0 0 0  Restless 2 0 0 0  Easily annoyed or irritable 1 1 1  0  Afraid - awful might happen 1 1 1  0  Total GAD 7 Score 13 5 5  0  Anxiety Difficulty Somewhat difficult Somewhat difficult Somewhat difficult -     Relevant past medical, surgical, family and social history reviewed. Allergies and medications reviewed and updated. History  Smoking Status  . Never Smoker  Smokeless Tobacco  . Never Used   ROS: Per HPI   Objective:    BP 128/87   Pulse 85   Temp 97 F (36.1 C)   Ht 5\' 5"  (1.651 m)   Wt 266 lb 6.4 oz (120.8 kg)   BMI 44.33 kg/m   Wt Readings from Last 3 Encounters:  09/20/15 266 lb 6.4 oz (120.8 kg)    08/24/15 265 lb (120.2 kg)  08/09/15 263 lb 3.2 oz (119.4 kg)    Gen: NAD, alert, cooperative with exam, NCAT EYES: EOMI, no conjunctival injection, or no icterus ENT:   OP without erythema LYMPH: no cervical LAD CV: NRRR, normal S1/S2, no murmur, distal pulses 2+ b/l Resp: CTABL, no wheezes, normal WOB Ext: No edema, warm Neuro: Alert and oriented, strength equal b/l UE and LE, coordination grossly normal MSK: normal muscle bulk  Assessment & Plan:  Islarose was seen today for follow-up.  Diagnoses and all orders for this visit:  Benign essential hypertension Well controlled Cont diltiazem  Midline low back pain without sciatica Takes hydrocodone apprx 3 times a week now Helps improve pain, improve function with ADLs Discussed not taking it at same time as xanax which she also takes only a few times a week Pain contract signed today Gave two scripts for #30 tabs of below, dated today and 10/20/15 Needs to be seen for any further refills -     HYDROcodone-acetaminophen (NORCO/VICODIN) 5-325 MG tablet; Take 1 tablet by mouth daily as needed for moderate pain. DO NOT FILL UNTIL 10/20/2015  Encounter for opiate analgesic use agreement  Pain medication agreement signed -     ToxASSURE Select 13 (MW), Urine  Shortness of breath Ongoing problem Normal cardiac stress test though pt isnt sure exactly when -     ECHOCARDIOGRAM COMPLETE; Future  Flank pain -     Urinalysis, Complete   Follow up plan: Return in about 3 months (around 12/20/2015). Assunta Found, MD [place]

## 2015-09-21 ENCOUNTER — Encounter: Payer: Self-pay | Admitting: Pediatrics

## 2015-09-21 DIAGNOSIS — N3000 Acute cystitis without hematuria: Secondary | ICD-10-CM

## 2015-09-21 MED ORDER — NITROFURANTOIN MONOHYD MACRO 100 MG PO CAPS: 100.0000 mg | ORAL_CAPSULE | Freq: Two times a day (BID) | ORAL | 0 refills | Status: AC

## 2015-09-24 ENCOUNTER — Ambulatory Visit: Payer: BLUE CROSS/BLUE SHIELD | Admitting: Pediatrics

## 2015-09-25 ENCOUNTER — Telehealth: Payer: Self-pay | Admitting: Pediatrics

## 2015-09-25 ENCOUNTER — Encounter: Payer: Self-pay | Admitting: Pediatrics

## 2015-09-25 DIAGNOSIS — F32A Depression, unspecified: Secondary | ICD-10-CM

## 2015-09-25 DIAGNOSIS — F329 Major depressive disorder, single episode, unspecified: Secondary | ICD-10-CM

## 2015-09-25 NOTE — Telephone Encounter (Signed)
Please review and advise Did you want pt to have Echo?

## 2015-09-26 MED ORDER — BUPROPION HCL ER (SR) 150 MG PO TB12: ORAL_TABLET | ORAL | 1 refills | Status: DC

## 2015-09-26 NOTE — Telephone Encounter (Signed)
Yes, ECHO was ordered, does not look like it has been scheduled yet. Unlikely to happen this week, usually takes a couple of weeks to get scheduled.

## 2015-09-26 NOTE — Telephone Encounter (Signed)
Please advise patient on appointment.

## 2015-09-27 LAB — TOXASSURE SELECT 13 (MW), URINE

## 2015-10-05 ENCOUNTER — Other Ambulatory Visit (HOSPITAL_COMMUNITY): Payer: BLUE CROSS/BLUE SHIELD

## 2015-10-10 ENCOUNTER — Ambulatory Visit (HOSPITAL_COMMUNITY)
Admission: RE | Admit: 2015-10-10 | Discharge: 2015-10-10 | Disposition: A | Discharge status: Home / Self Care | Payer: BLUE CROSS/BLUE SHIELD | Source: Ambulatory Visit | Attending: Pediatrics | Admitting: Pediatrics

## 2015-10-10 DIAGNOSIS — I351 Nonrheumatic aortic (valve) insufficiency: Secondary | ICD-10-CM | POA: Insufficient documentation

## 2015-10-10 DIAGNOSIS — R0602 Shortness of breath: Secondary | ICD-10-CM | POA: Diagnosis not present

## 2015-10-10 DIAGNOSIS — Z6841 Body Mass Index (BMI) 40.0 and over, adult: Secondary | ICD-10-CM | POA: Insufficient documentation

## 2015-10-10 DIAGNOSIS — E785 Hyperlipidemia, unspecified: Secondary | ICD-10-CM | POA: Insufficient documentation

## 2015-10-10 DIAGNOSIS — N1330 Unspecified hydronephrosis: Secondary | ICD-10-CM | POA: Diagnosis not present

## 2015-10-10 DIAGNOSIS — N200 Calculus of kidney: Secondary | ICD-10-CM | POA: Diagnosis not present

## 2015-10-10 DIAGNOSIS — R0600 Dyspnea, unspecified: Secondary | ICD-10-CM | POA: Diagnosis present

## 2015-10-10 DIAGNOSIS — I119 Hypertensive heart disease without heart failure: Secondary | ICD-10-CM | POA: Insufficient documentation

## 2015-10-10 NOTE — Progress Notes (Signed)
*  PRELIMINARY RESULTS* Echocardiogram 2D Echocardiogram has been performed.  Samuel Germany 10/10/2015, 11:08 AM

## 2015-10-12 ENCOUNTER — Encounter: Payer: Self-pay | Admitting: Pediatrics

## 2015-10-16 ENCOUNTER — Encounter: Payer: Self-pay | Admitting: Family Medicine

## 2015-10-16 ENCOUNTER — Ambulatory Visit (INDEPENDENT_AMBULATORY_CARE_PROVIDER_SITE_OTHER): Payer: BLUE CROSS/BLUE SHIELD | Admitting: Family Medicine

## 2015-10-16 VITALS — Ht 65.0 in | Wt 267.4 lb

## 2015-10-16 DIAGNOSIS — R399 Unspecified symptoms and signs involving the genitourinary system: Secondary | ICD-10-CM | POA: Diagnosis not present

## 2015-10-16 DIAGNOSIS — R509 Fever, unspecified: Secondary | ICD-10-CM | POA: Diagnosis not present

## 2015-10-16 DIAGNOSIS — M791 Myalgia, unspecified site: Secondary | ICD-10-CM

## 2015-10-16 DIAGNOSIS — N390 Urinary tract infection, site not specified: Secondary | ICD-10-CM | POA: Diagnosis not present

## 2015-10-16 DIAGNOSIS — R8281 Pyuria: Secondary | ICD-10-CM

## 2015-10-16 DIAGNOSIS — R8271 Bacteriuria: Secondary | ICD-10-CM

## 2015-10-16 LAB — URINALYSIS, COMPLETE
BILIRUBIN UA: NEGATIVE
GLUCOSE, UA: NEGATIVE
KETONES UA: NEGATIVE
NITRITE UA: NEGATIVE
Protein, UA: NEGATIVE
SPEC GRAV UA: 1.015 (ref 1.005–1.030)
UUROB: 0.2 mg/dL (ref 0.2–1.0)
pH, UA: 7.5 (ref 5.0–7.5)

## 2015-10-16 LAB — MICROSCOPIC EXAMINATION: Epithelial Cells (non renal): >10 /hpf — AB (ref 0–10)

## 2015-10-16 MED ORDER — DOXYCYCLINE HYCLATE 100 MG PO CAPS: 100.0000 mg | ORAL_CAPSULE | Freq: Two times a day (BID) | ORAL | 0 refills | Status: DC

## 2015-10-16 NOTE — Addendum Note (Signed)
Addended by: Marin Olp on: 10/16/2015 05:06 PM   Modules accepted: Orders

## 2015-10-16 NOTE — Progress Notes (Signed)
Subjective:  [name], female    DOB: Aug 01, 1955  Age: 60 y.o. MRN: [id number]  CC: Flank Pain (pt here today c/o flank pain and just feeling yucky for the past few days and has been running a low grade fever)   HPI Sherri Howard presents for Fever and chills for 2 days. She says her fever was 101.8 earlier today. She has stiffness in the neck soreness in the shoulders and pain and tightness going down her back. She denies abdominal pain. She does not have a cough, shortness of breath or upper respiratory symptoms. She also denies dysuria. However, she is under care of urologist who has planned for her to have an ultrasound of the kidneys and a couple days due to apparent hydronephrosis.  History Sherri Howard has a past medical history of Hypertension and Kidney stones.   She has a past surgical history that includes Hernia repair; Tonsillectomy (05/1973); Cholecystectomy; and Appendectomy.   Her family history includes Alzheimer's disease in her father; Aneurysm in her mother; Cancer in her father and mother; Hypertension in her father.She reports that she has never smoked. She has never used smokeless tobacco. She reports that she does not drink alcohol or use drugs.  Current Outpatient Prescriptions on File Prior to Visit  Medication Sig Dispense Refill  . ALPRAZolam (XANAX) 1 MG tablet Take 1 mg by mouth 3 (three) times daily as needed.     Marland Kitchen buPROPion (WELLBUTRIN SR) 150 MG 12 hr tablet Take once a day for 3 days, then twice a day 60 tablet 1  . Cholecalciferol (VITAMIN D3) 5000 units TABS Take by mouth.    . Coenzyme Q10 100 MG capsule Take 100 mg by mouth.    . diltiazem (CARDIZEM CD) 240 MG 24 hr capsule Take 240 mg by mouth daily.     Marland Kitchen HYDROcodone-acetaminophen (NORCO/VICODIN) 5-325 MG tablet Take 1 tablet by mouth daily as needed for moderate pain. DO NOT FILL UNTIL 10/20/2015 30 tablet 0  . losartan (COZAAR) 50 MG tablet Take 50 mg by mouth daily.     . magnesium oxide  (MAG-OX) 400 MG tablet Take 400 mg by mouth.    . oxybutynin (DITROPAN-XL) 10 MG 24 hr tablet TAKE 1 TABLET TWICE A DAY    . VIIBRYD 40 MG TABS      No current facility-administered medications on file prior to visit.     ROS Review of Systems  Constitutional: Negative for activity change, appetite change and fever.  HENT: Negative for congestion, rhinorrhea and sore throat.   Eyes: Negative for visual disturbance.  Respiratory: Negative for cough and shortness of breath.   Cardiovascular: Negative for chest pain and palpitations.  Gastrointestinal: Positive for abdominal pain (plus minus, nonspecific) and constipation (last bowel movement 4 days ago. Used MiraLAX at that time). Negative for diarrhea and nausea.  Genitourinary: Negative for dysuria.  Musculoskeletal: Positive for back pain, myalgias and neck stiffness. Negative for arthralgias.    Objective:  Ht 5' 5"  (1.651 m)   Wt 267 lb 6 oz (121.3 kg)   BMI 44.49 kg/m   Physical Exam  Constitutional: She is oriented to person, place, and time. She appears well-developed and well-nourished. No distress.  HENT:  Head: Normocephalic and atraumatic.  Right Ear: External ear normal.  Left Ear: External ear normal.  Nose: Nose normal.  Mouth/Throat: Oropharynx is clear and moist.  Eyes: Conjunctivae and EOM are normal. Pupils are equal, round, and reactive to light.  Neck: Normal range of motion. Neck supple. No thyromegaly present.  Cardiovascular: Normal rate, regular rhythm and normal heart sounds.   No murmur heard. Pulmonary/Chest: Effort normal and breath sounds normal. No respiratory distress. She has no wheezes. She has no rales.  Abdominal: Soft. Bowel sounds are normal. She exhibits no distension. There is tenderness (;Left lower quadrant and right upper quadrant).  Lymphadenopathy:    She has no cervical adenopathy.  Neurological: She is alert and oriented to person, place, and time. She has normal reflexes.  Skin:  Skin is warm and dry.  Psychiatric: Her behavior is normal. Thought content normal.    Assessment & Plan:   Sherri Howard was seen today for flank pain.  Diagnoses and all orders for this visit:  UTI symptoms -     Urinalysis, Complete -     CBC with Differential/Platelet -     CMP14+EGFR -     Urine culture  Fever and chills -     CBC with Differential/Platelet -     CMP14+EGFR  Myalgia -     CBC with Differential/Platelet -     CMP14+EGFR  Bacteriuria with pyuria -     CBC with Differential/Platelet -     CMP14+EGFR -     Urine culture  Other orders -     doxycycline (VIBRAMYCIN) 100 MG capsule; Take 1 capsule (100 mg total) by mouth 2 (two) times daily.   I am having Sherri Howard start on doxycycline. I am also having her maintain her losartan, diltiazem, ALPRAZolam, Vitamin D3, Coenzyme Q10, magnesium oxide, oxybutynin, VIIBRYD, HYDROcodone-acetaminophen, and buPROPion.  Meds ordered this encounter  Medications  . doxycycline (VIBRAMYCIN) 100 MG capsule    Sig: Take 1 capsule (100 mg total) by mouth 2 (two) times daily.    Dispense:  20 capsule    Refill:  0     Follow-up: Return if symptoms worsen or fail to improve.  Claretta Fraise, M.D.

## 2015-10-17 LAB — CBC WITH DIFFERENTIAL/PLATELET
BASOS: 0 %
Basophils Absolute: 0 10*3/uL (ref 0.0–0.2)
EOS (ABSOLUTE): 0 10*3/uL (ref 0.0–0.4)
EOS: 0 %
HEMATOCRIT: 40.5 % (ref 34.0–46.6)
HEMOGLOBIN: 13.7 g/dL (ref 11.1–15.9)
IMMATURE GRANS (ABS): 0 10*3/uL (ref 0.0–0.1)
IMMATURE GRANULOCYTES: 0 %
LYMPHS: 13 %
Lymphocytes Absolute: 1.3 10*3/uL (ref 0.7–3.1)
MCH: 28.8 pg (ref 26.6–33.0)
MCHC: 33.8 g/dL (ref 31.5–35.7)
MCV: 85 fL (ref 79–97)
MONOCYTES: 13 %
Monocytes Absolute: 1.3 10*3/uL — ABNORMAL HIGH (ref 0.1–0.9)
NEUTROS PCT: 74 %
Neutrophils Absolute: 7.4 10*3/uL — ABNORMAL HIGH (ref 1.4–7.0)
Platelets: 269 10*3/uL (ref 150–379)
RBC: 4.76 x10E6/uL (ref 3.77–5.28)
RDW: 14 % (ref 12.3–15.4)
WBC: 10.1 10*3/uL (ref 3.4–10.8)

## 2015-10-17 LAB — CMP14+EGFR
A/G RATIO: 1.6 (ref 1.2–2.2)
ALT: 37 IU/L — ABNORMAL HIGH (ref 0–32)
AST: 15 IU/L (ref 0–40)
Albumin: 4.4 g/dL (ref 3.5–5.5)
Alkaline Phosphatase: 86 IU/L (ref 39–117)
BUN/Creatinine Ratio: 13 (ref 9–23)
BUN: 15 mg/dL (ref 6–24)
Bilirubin Total: 0.5 mg/dL (ref 0.0–1.2)
CALCIUM: 9.5 mg/dL (ref 8.7–10.2)
CO2: 24 mmol/L (ref 18–29)
CREATININE: 1.14 mg/dL — AB (ref 0.57–1.00)
Chloride: 98 mmol/L (ref 96–106)
GFR, EST AFRICAN AMERICAN: 61 mL/min/{1.73_m2} (ref >59)
GFR, EST NON AFRICAN AMERICAN: 53 mL/min/{1.73_m2} — AB (ref >59)
GLOBULIN, TOTAL: 2.8 g/dL (ref 1.5–4.5)
Glucose: 110 mg/dL — ABNORMAL HIGH (ref 65–99)
Potassium: 4.5 mmol/L (ref 3.5–5.2)
Sodium: 137 mmol/L (ref 134–144)
TOTAL PROTEIN: 7.2 g/dL (ref 6.0–8.5)

## 2015-10-18 DIAGNOSIS — N1330 Unspecified hydronephrosis: Secondary | ICD-10-CM | POA: Diagnosis not present

## 2015-10-18 DIAGNOSIS — N132 Hydronephrosis with renal and ureteral calculous obstruction: Secondary | ICD-10-CM | POA: Diagnosis not present

## 2015-10-18 DIAGNOSIS — N281 Cyst of kidney, acquired: Secondary | ICD-10-CM | POA: Diagnosis not present

## 2015-10-19 LAB — URINE CULTURE

## 2015-10-22 ENCOUNTER — Other Ambulatory Visit: Payer: Self-pay | Admitting: Pediatrics

## 2015-10-22 ENCOUNTER — Other Ambulatory Visit: Payer: Self-pay | Admitting: Family Medicine

## 2015-10-22 DIAGNOSIS — F32A Depression, unspecified: Secondary | ICD-10-CM

## 2015-10-22 DIAGNOSIS — B965 Pseudomonas (aeruginosa) (mallei) (pseudomallei) as the cause of diseases classified elsewhere: Secondary | ICD-10-CM

## 2015-10-22 DIAGNOSIS — N390 Urinary tract infection, site not specified: Secondary | ICD-10-CM

## 2015-10-22 DIAGNOSIS — F329 Major depressive disorder, single episode, unspecified: Secondary | ICD-10-CM

## 2015-10-23 ENCOUNTER — Encounter: Payer: Self-pay | Admitting: Family Medicine

## 2015-10-24 DIAGNOSIS — N200 Calculus of kidney: Secondary | ICD-10-CM | POA: Diagnosis not present

## 2015-10-24 DIAGNOSIS — N1330 Unspecified hydronephrosis: Secondary | ICD-10-CM | POA: Diagnosis not present

## 2015-10-24 DIAGNOSIS — N201 Calculus of ureter: Secondary | ICD-10-CM | POA: Diagnosis not present

## 2015-10-25 DIAGNOSIS — N1330 Unspecified hydronephrosis: Secondary | ICD-10-CM | POA: Diagnosis not present

## 2015-11-07 DIAGNOSIS — R569 Unspecified convulsions: Secondary | ICD-10-CM | POA: Diagnosis not present

## 2015-11-07 DIAGNOSIS — Z466 Encounter for fitting and adjustment of urinary device: Secondary | ICD-10-CM | POA: Diagnosis not present

## 2015-11-07 DIAGNOSIS — F419 Anxiety disorder, unspecified: Secondary | ICD-10-CM | POA: Diagnosis not present

## 2015-11-07 DIAGNOSIS — Z6841 Body Mass Index (BMI) 40.0 and over, adult: Secondary | ICD-10-CM | POA: Diagnosis not present

## 2015-11-07 DIAGNOSIS — F329 Major depressive disorder, single episode, unspecified: Secondary | ICD-10-CM | POA: Diagnosis not present

## 2015-11-07 DIAGNOSIS — Z881 Allergy status to other antibiotic agents status: Secondary | ICD-10-CM | POA: Diagnosis not present

## 2015-11-07 DIAGNOSIS — Z887 Allergy status to serum and vaccine status: Secondary | ICD-10-CM | POA: Diagnosis not present

## 2015-11-07 DIAGNOSIS — Z833 Family history of diabetes mellitus: Secondary | ICD-10-CM | POA: Diagnosis not present

## 2015-11-07 DIAGNOSIS — N200 Calculus of kidney: Secondary | ICD-10-CM | POA: Diagnosis not present

## 2015-11-07 DIAGNOSIS — N201 Calculus of ureter: Secondary | ICD-10-CM | POA: Insufficient documentation

## 2015-11-07 DIAGNOSIS — Z888 Allergy status to other drugs, medicaments and biological substances status: Secondary | ICD-10-CM | POA: Diagnosis not present

## 2015-11-07 DIAGNOSIS — Z79899 Other long term (current) drug therapy: Secondary | ICD-10-CM | POA: Diagnosis not present

## 2015-11-07 DIAGNOSIS — G4730 Sleep apnea, unspecified: Secondary | ICD-10-CM | POA: Diagnosis not present

## 2015-11-07 DIAGNOSIS — Z8249 Family history of ischemic heart disease and other diseases of the circulatory system: Secondary | ICD-10-CM | POA: Diagnosis not present

## 2015-11-07 DIAGNOSIS — Z9109 Other allergy status, other than to drugs and biological substances: Secondary | ICD-10-CM | POA: Diagnosis not present

## 2015-11-07 DIAGNOSIS — I10 Essential (primary) hypertension: Secondary | ICD-10-CM | POA: Diagnosis not present

## 2015-11-07 DIAGNOSIS — R0602 Shortness of breath: Secondary | ICD-10-CM | POA: Diagnosis not present

## 2015-11-07 HISTORY — DX: Calculus of ureter: N20.1

## 2015-11-09 DIAGNOSIS — N12 Tubulo-interstitial nephritis, not specified as acute or chronic: Secondary | ICD-10-CM | POA: Diagnosis not present

## 2015-11-09 DIAGNOSIS — R5081 Fever presenting with conditions classified elsewhere: Secondary | ICD-10-CM | POA: Diagnosis not present

## 2015-11-09 DIAGNOSIS — R509 Fever, unspecified: Secondary | ICD-10-CM | POA: Diagnosis not present

## 2015-11-09 DIAGNOSIS — R7989 Other specified abnormal findings of blood chemistry: Secondary | ICD-10-CM | POA: Diagnosis not present

## 2015-11-09 DIAGNOSIS — B965 Pseudomonas (aeruginosa) (mallei) (pseudomallei) as the cause of diseases classified elsewhere: Secondary | ICD-10-CM | POA: Diagnosis not present

## 2015-11-09 DIAGNOSIS — R109 Unspecified abdominal pain: Secondary | ICD-10-CM | POA: Diagnosis not present

## 2015-11-09 DIAGNOSIS — M545 Low back pain: Secondary | ICD-10-CM | POA: Diagnosis not present

## 2015-11-09 DIAGNOSIS — N200 Calculus of kidney: Secondary | ICD-10-CM | POA: Diagnosis not present

## 2015-11-09 DIAGNOSIS — Z9889 Other specified postprocedural states: Secondary | ICD-10-CM | POA: Diagnosis not present

## 2015-11-09 DIAGNOSIS — N10 Acute tubulo-interstitial nephritis: Secondary | ICD-10-CM | POA: Diagnosis not present

## 2015-11-10 DIAGNOSIS — R7989 Other specified abnormal findings of blood chemistry: Secondary | ICD-10-CM | POA: Diagnosis not present

## 2015-11-10 DIAGNOSIS — N10 Acute tubulo-interstitial nephritis: Secondary | ICD-10-CM | POA: Diagnosis not present

## 2015-11-11 DIAGNOSIS — N200 Calculus of kidney: Secondary | ICD-10-CM | POA: Diagnosis not present

## 2015-11-29 DIAGNOSIS — N201 Calculus of ureter: Secondary | ICD-10-CM | POA: Diagnosis not present

## 2015-11-30 ENCOUNTER — Telehealth: Payer: Self-pay | Admitting: Pediatrics

## 2015-12-03 NOTE — Telephone Encounter (Signed)
I gave Sherri Howard the request for the patient's medical records. She is going to look at it.

## 2015-12-07 ENCOUNTER — Encounter: Payer: Self-pay | Admitting: Pediatrics

## 2015-12-11 ENCOUNTER — Encounter: Payer: Self-pay | Admitting: Pediatrics

## 2015-12-12 ENCOUNTER — Other Ambulatory Visit: Payer: Self-pay | Admitting: Pediatrics

## 2015-12-12 ENCOUNTER — Telehealth: Payer: Self-pay | Admitting: Pediatrics

## 2015-12-12 DIAGNOSIS — F329 Major depressive disorder, single episode, unspecified: Secondary | ICD-10-CM

## 2015-12-12 DIAGNOSIS — F32A Depression, unspecified: Secondary | ICD-10-CM

## 2015-12-12 MED ORDER — BUPROPION HCL ER (SR) 150 MG PO TB12: 150.0000 mg | ORAL_TABLET | Freq: Every day | ORAL | 1 refills | Status: DC

## 2015-12-12 MED ORDER — DILTIAZEM HCL ER COATED BEADS 240 MG PO CP24: 240.0000 mg | ORAL_CAPSULE | Freq: Every day | ORAL | 1 refills | Status: DC

## 2015-12-12 MED ORDER — VIIBRYD 40 MG PO TABS: 40.0000 mg | ORAL_TABLET | Freq: Every day | ORAL | 1 refills | Status: DC

## 2015-12-12 NOTE — Telephone Encounter (Signed)
done

## 2015-12-12 NOTE — Telephone Encounter (Signed)
Spoke w/patient and explained she would need to fill out a release here at our office or at the [facility] office to get records requested.

## 2015-12-14 ENCOUNTER — Other Ambulatory Visit: Payer: Self-pay | Admitting: *Deleted

## 2015-12-14 DIAGNOSIS — F32A Depression, unspecified: Secondary | ICD-10-CM

## 2015-12-14 DIAGNOSIS — G4733 Obstructive sleep apnea (adult) (pediatric): Secondary | ICD-10-CM | POA: Diagnosis not present

## 2015-12-14 DIAGNOSIS — F329 Major depressive disorder, single episode, unspecified: Secondary | ICD-10-CM

## 2015-12-14 MED ORDER — DILTIAZEM HCL ER COATED BEADS 240 MG PO CP24: 240.0000 mg | ORAL_CAPSULE | Freq: Every day | ORAL | 1 refills | Status: DC

## 2015-12-14 MED ORDER — BUPROPION HCL ER (SR) 150 MG PO TB12: 150.0000 mg | ORAL_TABLET | Freq: Every day | ORAL | 1 refills | Status: DC

## 2015-12-14 MED ORDER — VIIBRYD 40 MG PO TABS: 40.0000 mg | ORAL_TABLET | Freq: Every day | ORAL | 1 refills | Status: DC

## 2015-12-24 ENCOUNTER — Encounter: Payer: Self-pay | Admitting: Pediatrics

## 2015-12-25 ENCOUNTER — Encounter: Payer: Self-pay | Admitting: Family Medicine

## 2015-12-25 ENCOUNTER — Ambulatory Visit (INDEPENDENT_AMBULATORY_CARE_PROVIDER_SITE_OTHER): Payer: BLUE CROSS/BLUE SHIELD | Admitting: Family Medicine

## 2015-12-25 VITALS — BP 139/85 | HR 85 | Temp 98.6°F | Ht 65.0 in | Wt 265.0 lb

## 2015-12-25 DIAGNOSIS — I10 Essential (primary) hypertension: Secondary | ICD-10-CM

## 2015-12-25 NOTE — Assessment & Plan Note (Signed)
Blood pressure was elevated today at home into the Q000111Q and XX123456 systolic, she did not express any chest pain or blurred vision or headaches during that time. And she is not having any of those right now. Her blood pressure today in office is normal, will continue to monitor, she does admit that she was in increased pain during that time.

## 2015-12-25 NOTE — Progress Notes (Signed)
BP 139/85   Pulse 85   Temp 98.6 F (37 C) (Oral)   Ht 5\' 5"  (1.651 m)   Wt 265 lb (120.2 kg)   BMI 44.10 kg/m    Subjective:    [name], female    DOB: 1955-10-08, 60 y.o.   MRN: [id number]  HPI: Sherri Howard is a 60 y.o. female presenting on 12/25/2015 for BP has been elevated (177/95 pulse 91, 170/93 pulse 91, 154/88 pulse 95 earlier today)   HPI Elevated blood pressure Patient had elevated blood pressure today at home that was in the 177/95 and 154/88 range. She does admit that her pain was quite elevated during that time. She denied any chest pain or shortness of breath or visual changes or headaches. She has multiple blood pressure readings on her monitor that have been completely normal prior to today but she was more just concerned about the incident of today. She has been taking her medications as prescribed.  Relevant past medical, surgical, family and social history reviewed and updated as indicated. Interim medical history since our last visit reviewed. Allergies and medications reviewed and updated.  Review of Systems  Constitutional: Negative for chills and fever.  Eyes: Negative for visual disturbance.  Respiratory: Negative for chest tightness and shortness of breath.   Cardiovascular: Negative for chest pain, palpitations and leg swelling.  Genitourinary: Negative for difficulty urinating and dysuria.  Musculoskeletal: Positive for back pain. Negative for gait problem.  Skin: Negative for rash.  Neurological: Negative for dizziness, light-headedness and headaches.  All other systems reviewed and are negative.   Per HPI unless specifically indicated above      Objective:    BP 139/85   Pulse 85   Temp 98.6 F (37 C) (Oral)   Ht 5\' 5"  (1.651 m)   Wt 265 lb (120.2 kg)   BMI 44.10 kg/m   Wt Readings from Last 3 Encounters:  12/25/15 265 lb (120.2 kg)  10/16/15 267 lb 6 oz (121.3 kg)  09/20/15 266 lb 6.4 oz (120.8 kg)    Physical  Exam  Constitutional: She is oriented to person, place, and time. She appears well-developed and well-nourished. No distress.  Eyes: Conjunctivae are normal.  Cardiovascular: Normal rate, regular rhythm, normal heart sounds and intact distal pulses.   No murmur heard. Pulmonary/Chest: Effort normal and breath sounds normal. No respiratory distress. She has no wheezes. She has no rales.  Musculoskeletal: Normal range of motion. She exhibits no edema.  Neurological: She is alert and oriented to person, place, and time. Coordination normal.  Skin: She is not diaphoretic.  Nursing note and vitals reviewed.      Assessment & Plan:   Problem List Items Addressed This Visit      Cardiovascular and Mediastinum   Benign essential hypertension - Primary    Blood pressure was elevated today at home into the Q000111Q and XX123456 systolic, she did not express any chest pain or blurred vision or headaches during that time. And she is not having any of those right now. Her blood pressure today in office is normal, will continue to monitor, she does admit that she was in increased pain during that time.          Follow up plan: Return if symptoms worsen or fail to improve.  Counseling provided for all of the vaccine components No orders of the defined types were placed in this encounter.   Caryl Pina, MD [facility] 12/25/2015, 4:59 PM

## 2015-12-27 ENCOUNTER — Encounter: Payer: Self-pay | Admitting: Pediatrics

## 2015-12-27 ENCOUNTER — Ambulatory Visit (INDEPENDENT_AMBULATORY_CARE_PROVIDER_SITE_OTHER): Payer: BLUE CROSS/BLUE SHIELD

## 2015-12-27 ENCOUNTER — Ambulatory Visit (INDEPENDENT_AMBULATORY_CARE_PROVIDER_SITE_OTHER): Payer: BLUE CROSS/BLUE SHIELD | Admitting: Pediatrics

## 2015-12-27 VITALS — BP 129/83 | HR 92 | Temp 97.8°F | Resp 20 | Ht 65.0 in | Wt 268.0 lb

## 2015-12-27 DIAGNOSIS — F411 Generalized anxiety disorder: Secondary | ICD-10-CM | POA: Diagnosis not present

## 2015-12-27 DIAGNOSIS — I10 Essential (primary) hypertension: Secondary | ICD-10-CM

## 2015-12-27 DIAGNOSIS — F329 Major depressive disorder, single episode, unspecified: Secondary | ICD-10-CM | POA: Diagnosis not present

## 2015-12-27 DIAGNOSIS — R0602 Shortness of breath: Secondary | ICD-10-CM

## 2015-12-27 DIAGNOSIS — M545 Low back pain, unspecified: Secondary | ICD-10-CM

## 2015-12-27 DIAGNOSIS — G8929 Other chronic pain: Secondary | ICD-10-CM | POA: Diagnosis not present

## 2015-12-27 DIAGNOSIS — F32A Depression, unspecified: Secondary | ICD-10-CM

## 2015-12-27 MED ORDER — ALPRAZOLAM 1 MG PO TABS: 1.0000 mg | ORAL_TABLET | Freq: Two times a day (BID) | ORAL | 1 refills | Status: DC | PRN

## 2015-12-27 MED ORDER — BUPROPION HCL ER (SR) 150 MG PO TB12: 150.0000 mg | ORAL_TABLET | Freq: Two times a day (BID) | ORAL | 1 refills | Status: DC

## 2015-12-27 NOTE — Patient Instructions (Signed)
[email or web address]  Your provider wants you to schedule an appointment with a Psychologist/Psychiatrist. The following list of offices requires the patient to call and make their own appointment, as there is information they need that only you can provide. Please feel free to choose form the following providers:  [facility]   [phone] Crisis Recovery in [place] [phone]  [facility]  [phone]   [address] [place], [place]  (Scheduled through [facility]) Must call and do an interview for appointment. Sees Children / Accepts Medicaid  [facility]    [phone]  [address], [address]    [place], [place]       [facility]  [phone] [address] [place], [place]  Evaluates for Autism but does not treat it Sees Children / Accepts Medicaid  [facility]    [phone] [address], [address]   [place], [place] Medication management, substance abuse, bipolar, grief, family, marriage, OCD, anxiety, PTSD Sees children / Accepts Medicaid  [facility]    [phone] [address] [place], [place] Sees children / Accepts Medicaid  [facility]  [phone] [address] [place], [place]   Dr Akinlayo     [phone] [address] [place], [place]  Sees ADD & ADHD for treatment Accepts Medicaid  [facility]  [phone] [address] [place], [place] Evaluates for Autism Accepts Medicaid  [facility]   [phone] [address] [place], [place]  Does Adult ADD evaluations Does not accept Medicaid  [facility]   [phone] [address]   ,  Uses animal therapy  Sees children as young as 3 years old Accepts Medicaid    

## 2015-12-27 NOTE — Progress Notes (Signed)
  Subjective:   [name], female    DOB: 1955/04/27, 60 y.o.   MRN: [id number] CC: Medication Check  HPI: Sherri Howard is a 60 y.o. female presenting for Medication Check  Seen two days ago for elevated BP Was in pain from lower back problems at the time Back pain comes and goes Mostly lower back pain  Mood: has been crying more lately Stress at home Not taking xanax every day About every 3 days On vibryd 40mg  once a day Feels safe at home  Back pain improved Comes and goes  SOB: present for months Worsening Walking around house gets SOB When she stops and sits gets better after a few minutes No chest pain   Relevant past medical, surgical, family and social history reviewed. Allergies and medications reviewed and updated. History  Smoking Status  . Never Smoker  Smokeless Tobacco  . Never Used   ROS: Per HPI   Objective:    BP 129/83   Pulse 92   Temp 97.8 F (36.6 C) (Oral)   Resp 20   Ht 5\' 5"  (1.651 m)   Wt 268 lb (121.6 kg)   SpO2 98%   BMI 44.60 kg/m   Wt Readings from Last 3 Encounters:  12/27/15 268 lb (121.6 kg)  12/25/15 265 lb (120.2 kg)  10/16/15 267 lb 6 oz (121.3 kg)    Gen: NAD, alert, cooperative with exam, NCAT EYES: EOMI, no conjunctival injection, or no icterus CV: NRRR, normal S1/S2, no murmur, distal pulses 2+ b/l Resp: CTABL, no wheezes, normal WOB Ext: No edema, warm Neuro: Alert and oriented, strength equal b/l UE and LE, coordination grossly normal MSK: normal muscle bulk  Assessment & Plan:  Sherri Howard was seen today for medication check.  Diagnoses and all orders for this visit:  Benign essential hypertension Well controlled, cont meds  Depression, unspecified depression type Feels safe at home Ongoing symptoms Encouraged follow up with counselor Take wellbutrin twice a day Cont viibryd -     buPROPion (WELLBUTRIN SR) 150 MG 12 hr tablet; Take 1 tablet (150 mg total) by mouth 2 (two) times  daily.  Generalized anxiety disorder Does not take every day, gets filled via [facility] Will send in #60 tabs for 90 days with 1 refill Must be seen for refills Let me know if needing more often -     ALPRAZolam (XANAX) 1 MG tablet; Take 1 tablet (1 mg total) by mouth 2 (two) times daily as needed.  Chronic midline low back pain without sciatica Takes hydrocodone as needed, not every day  Shortness of breath O2 sat 97% in clinic Worsening over last few months Now walking around house starts to get SOB, improves with rest Will refer for stress test, last was many years ago -     DG Chest 2 View; Future -     Ambulatory referral to Cardiology   Follow up plan: Return in about 2 months (around 02/27/2016) for med follow up. Assunta Found, MD [place]

## 2015-12-28 ENCOUNTER — Encounter: Payer: Self-pay | Admitting: Pediatrics

## 2016-01-19 ENCOUNTER — Encounter: Payer: Self-pay | Admitting: Pediatrics

## 2016-01-21 ENCOUNTER — Encounter: Payer: Self-pay | Admitting: *Deleted

## 2016-01-21 ENCOUNTER — Ambulatory Visit (INDEPENDENT_AMBULATORY_CARE_PROVIDER_SITE_OTHER): Payer: BLUE CROSS/BLUE SHIELD | Admitting: Cardiology

## 2016-01-21 ENCOUNTER — Encounter: Payer: Self-pay | Admitting: Cardiology

## 2016-01-21 VITALS — BP 131/82 | HR 74 | Ht 65.0 in | Wt 269.6 lb

## 2016-01-21 DIAGNOSIS — R0609 Other forms of dyspnea: Secondary | ICD-10-CM

## 2016-01-21 DIAGNOSIS — R0602 Shortness of breath: Secondary | ICD-10-CM

## 2016-01-21 DIAGNOSIS — I10 Essential (primary) hypertension: Secondary | ICD-10-CM | POA: Diagnosis not present

## 2016-01-21 MED ORDER — LOSARTAN POTASSIUM 50 MG PO TABS: 50.0000 mg | ORAL_TABLET | Freq: Every day | ORAL | 1 refills | Status: DC

## 2016-01-21 MED ORDER — OXYBUTYNIN CHLORIDE ER 10 MG PO TB24: 10.0000 mg | ORAL_TABLET | Freq: Two times a day (BID) | ORAL | 1 refills | Status: DC

## 2016-01-21 NOTE — Patient Instructions (Signed)
Medication Instructions:  Continue all current medications.  Labwork: none  Testing/Procedures:  Your physician has requested that you have en exercise stress myoview. For further information please visit [email or web address]. Please follow instruction sheet, as given.  Office will contact with results via phone or letter.    Follow-Up: Pending test results.   Any Other Special Instructions Will Be Listed Below (If Applicable).  If you need a refill on your cardiac medications before your next appointment, please call your pharmacy.

## 2016-01-21 NOTE — Progress Notes (Signed)
Clinical Summary Ms. Saulino is a 61 y.o.female seen as a new consult for SOB. She is referred by Dr Assunta Found.   1. SOB - echo 09/2015 LVEF 60-65%, no WMAs, grade I diastolic dysfunction, mild AI - 12/2015 CXR no acute process.   - SOB started about 1 year ago. DOE with activities. DOE at about 1 block. Also DOE with doing laundry - no chest pain. Previous palpitations though not recent. Occasional LE edema, occasional edema in hands - occasional cough, wheezing.  CAD risk factors: HTN, father with previous stents and CABG in his 55s. Maternal uncle with pacemaker.   2. HTN - compliant with meds   Past Medical History:  Diagnosis Date  . Hypertension   . Kidney stones      Allergies  Allergen Reactions  . Ciprofloxacin Rash  . Sulfur Rash  . Latex   . Prednisolone Other (See Comments)    Convulsions per pt  . Sulfa Antibiotics   . Influenza Vaccines Rash  . Oxycodone-Acetaminophen Rash  . Pneumococcal Vaccine Rash     Current Outpatient Prescriptions  Medication Sig Dispense Refill  . ALPRAZolam (XANAX) 1 MG tablet Take 1 tablet (1 mg total) by mouth 2 (two) times daily as needed. 60 tablet 1  . buPROPion (WELLBUTRIN SR) 150 MG 12 hr tablet Take 1 tablet (150 mg total) by mouth 2 (two) times daily. 180 tablet 1  . Cholecalciferol (VITAMIN D3) 5000 units TABS Take by mouth.    . diltiazem (CARDIZEM CD) 240 MG 24 hr capsule Take 1 capsule (240 mg total) by mouth daily. 90 capsule 1  . HYDROcodone-acetaminophen (NORCO/VICODIN) 5-325 MG tablet Take 1 tablet by mouth daily as needed for moderate pain. DO NOT FILL UNTIL 10/20/2015 30 tablet 0  . losartan (COZAAR) 50 MG tablet Take 50 mg by mouth daily.     . magnesium oxide (MAG-OX) 400 MG tablet Take 400 mg by mouth.    . oxybutynin (DITROPAN-XL) 10 MG 24 hr tablet TAKE 1 TABLET TWICE A DAY    . VIIBRYD 40 MG TABS Take 1 tablet (40 mg total) by mouth daily. 90 tablet 1   No current facility-administered  medications for this visit.      Past Surgical History:  Procedure Laterality Date  . APPENDECTOMY    . CHOLECYSTECTOMY    . HERNIA REPAIR    . TONSILLECTOMY  05/1973     Allergies  Allergen Reactions  . Ciprofloxacin Rash  . Sulfur Rash  . Latex   . Prednisolone Other (See Comments)    Convulsions per pt  . Sulfa Antibiotics   . Influenza Vaccines Rash  . Oxycodone-Acetaminophen Rash  . Pneumococcal Vaccine Rash      Family History  Problem Relation Age of Onset  . Cancer Mother   . Aneurysm Mother   . Cancer Father   . Hypertension Father   . Alzheimer's disease Father      Social History Ms. Locicero reports that she has never smoked. She has never used smokeless tobacco. Ms. Beier reports that she does not drink alcohol.   Review of Systems CONSTITUTIONAL: No weight loss, fever, chills, weakness or fatigue.  HEENT: Eyes: No visual loss, blurred vision, double vision or yellow sclerae.No hearing loss, sneezing, congestion, runny nose or sore throat.  SKIN: No rash or itching.  CARDIOVASCULAR: per HPI RESPIRATORY: per HPI GASTROINTESTINAL: No anorexia, nausea, vomiting or diarrhea. No abdominal pain or blood.  GENITOURINARY: No burning on  urination, no polyuria NEUROLOGICAL: No headache, dizziness, syncope, paralysis, ataxia, numbness or tingling in the extremities. No change in bowel or bladder control.  MUSCULOSKELETAL: No muscle, back pain, joint pain or stiffness.  LYMPHATICS: No enlarged nodes. No history of splenectomy.  PSYCHIATRIC: No history of depression or anxiety.  ENDOCRINOLOGIC: No reports of sweating, cold or heat intolerance. No polyuria or polydipsia.  Marland Kitchen   Physical Examination Vitals:   01/21/16 0844  BP: 131/82  Pulse: 74   Vitals:   01/21/16 0844  Weight: 269 lb 9.6 oz (122.3 kg)  Height: 5\' 5"  (1.651 m)    Gen: resting comfortably, no acute distress HEENT: no scleral icterus, pupils equal round and reactive, no palptable  cervical adenopathy,  CV Resp: Clear to auscultation bilaterally GI: abdomen is soft, non-tender, non-distended, normal bowel sounds, no hepatosplenomegaly MSK: extremities are warm, no edema.  Skin: warm, no rash Neuro:  no focal deficits Psych: appropriate affect     Assessment and Plan  1. SOB/DOE - unclear etiology. Fairly benign echo findings recently, CXR clear - she has CAD risk factors. Baseline EKG with poor R-wave progression - we will plan for exercise nuclear stress 2 day protocol based on body habitus, hold dilt day of test  2. HTN - at goal, continue current meds  F/u pending test results      Arnoldo Lenis, M.D.

## 2016-01-24 ENCOUNTER — Encounter (HOSPITAL_COMMUNITY)
Admission: RE | Admit: 2016-01-24 | Discharge: 2016-01-24 | Disposition: A | Discharge status: Home / Self Care | Payer: BLUE CROSS/BLUE SHIELD | Source: Ambulatory Visit | Attending: Cardiology | Admitting: Cardiology

## 2016-01-24 ENCOUNTER — Inpatient Hospital Stay (HOSPITAL_COMMUNITY): Admission: RE | Admit: 2016-01-24 | Payer: BLUE CROSS/BLUE SHIELD | Source: Ambulatory Visit

## 2016-01-24 ENCOUNTER — Encounter (HOSPITAL_COMMUNITY): Payer: BLUE CROSS/BLUE SHIELD

## 2016-01-24 DIAGNOSIS — R0602 Shortness of breath: Secondary | ICD-10-CM | POA: Insufficient documentation

## 2016-01-24 MED ORDER — TECHNETIUM TC 99M TETROFOSMIN IV KIT
30.0000 | PACK | Freq: Once | INTRAVENOUS | Status: AC | PRN
  Administered 2016-01-24: 30 via INTRAVENOUS

## 2016-01-24 MED ORDER — REGADENOSON 0.4 MG/5ML IV SOLN
INTRAVENOUS | Status: AC
  Administered 2016-01-24: 0.4 mg via INTRAVENOUS
  Filled 2016-01-24: qty 5

## 2016-01-24 MED ORDER — SODIUM CHLORIDE 0.9% FLUSH
INTRAVENOUS | Status: AC
  Administered 2016-01-24: 10 mL via INTRAVENOUS
  Filled 2016-01-24: qty 10

## 2016-01-25 ENCOUNTER — Encounter (HOSPITAL_COMMUNITY)
Admission: RE | Admit: 2016-01-25 | Discharge: 2016-01-25 | Disposition: A | Discharge status: Home / Self Care | Payer: BLUE CROSS/BLUE SHIELD | Source: Ambulatory Visit | Attending: Cardiology | Admitting: Cardiology

## 2016-01-25 DIAGNOSIS — R0602 Shortness of breath: Secondary | ICD-10-CM | POA: Insufficient documentation

## 2016-01-25 LAB — NM MYOCAR MULTI W/SPECT W/WALL MOTION / EF
CHL CUP MPHR: 162 {beats}/min
CHL CUP NUCLEAR SSS: 5
CSEPED: 3 min
CSEPEW: 6.2 METS
Exercise duration (sec): 41 s
LHR: 0.4
LV dias vol: 44 mL (ref 46–106)
LV sys vol: 12 mL
NUC STRESS TID: 0.69
Peak HR: 144 {beats}/min
RPE: 17
Rest HR: 78 {beats}/min
SDS: 3
SRS: 2

## 2016-01-25 MED ORDER — TECHNETIUM TC 99M TETROFOSMIN IV KIT
25.0000 | PACK | Freq: Once | INTRAVENOUS | Status: AC | PRN
  Administered 2016-01-25: 26.4 via INTRAVENOUS

## 2016-01-28 ENCOUNTER — Encounter: Payer: Self-pay | Admitting: Pediatrics

## 2016-01-28 ENCOUNTER — Telehealth: Payer: Self-pay | Admitting: *Deleted

## 2016-01-28 ENCOUNTER — Encounter: Payer: Self-pay | Admitting: Cardiology

## 2016-01-28 DIAGNOSIS — G4733 Obstructive sleep apnea (adult) (pediatric): Secondary | ICD-10-CM | POA: Diagnosis not present

## 2016-01-28 NOTE — Telephone Encounter (Signed)
-----   Message from Arnoldo Lenis, MD sent at 01/28/2016  1:16 PM EST ----- Stress test looks good, no evidence of any blockages. Based on her echo and stress test her heart looks to be in good shape and not the cause of her symptoms. She needs to f/u with her pcp to discuss other possible causes. F/u with [place] in 3 months to reevaluate symptoms   J BrancH MD

## 2016-01-28 NOTE — Telephone Encounter (Signed)
Pt aware - f/u appt scheduled - routed to pcp  

## 2016-02-27 ENCOUNTER — Telehealth: Payer: Self-pay | Admitting: Pediatrics

## 2016-02-28 ENCOUNTER — Ambulatory Visit (INDEPENDENT_AMBULATORY_CARE_PROVIDER_SITE_OTHER): Payer: BLUE CROSS/BLUE SHIELD | Admitting: Pediatrics

## 2016-02-28 ENCOUNTER — Encounter: Payer: Self-pay | Admitting: Pediatrics

## 2016-02-28 VITALS — BP 134/63 | HR 110 | Temp 99.9°F | Resp 20 | Ht 65.0 in | Wt 272.6 lb

## 2016-02-28 DIAGNOSIS — J111 Influenza due to unidentified influenza virus with other respiratory manifestations: Secondary | ICD-10-CM | POA: Diagnosis not present

## 2016-02-28 DIAGNOSIS — J029 Acute pharyngitis, unspecified: Secondary | ICD-10-CM | POA: Diagnosis not present

## 2016-02-28 DIAGNOSIS — R52 Pain, unspecified: Secondary | ICD-10-CM

## 2016-02-28 DIAGNOSIS — F339 Major depressive disorder, recurrent, unspecified: Secondary | ICD-10-CM | POA: Insufficient documentation

## 2016-02-28 DIAGNOSIS — R509 Fever, unspecified: Secondary | ICD-10-CM | POA: Diagnosis not present

## 2016-02-28 DIAGNOSIS — J069 Acute upper respiratory infection, unspecified: Secondary | ICD-10-CM

## 2016-02-28 LAB — VERITOR FLU A/B WAIVED
INFLUENZA A: NEGATIVE
Influenza B: NEGATIVE

## 2016-02-28 LAB — CULTURE, GROUP A STREP

## 2016-02-28 LAB — RAPID STREP SCREEN (MED CTR MEBANE ONLY): Strep Gp A Ag, IA W/Reflex: NEGATIVE

## 2016-02-28 MED ORDER — OSELTAMIVIR PHOSPHATE 75 MG PO CAPS: 75.0000 mg | ORAL_CAPSULE | Freq: Two times a day (BID) | ORAL | 0 refills | Status: AC

## 2016-02-28 NOTE — Progress Notes (Signed)
  Subjective:   [name], female    DOB: [birth year], 61 y.o.   MRN: [id number] CC: URI (sore throat, cough, body aches, fever started yesterday)  HPI: Sherri Howard is a 61 y.o. female presenting for URI (sore throat, cough, body aches, fever started yesterday)  Throat has been bothering her for about a week Feels swollen Husband started getting sick with nasal congestion Started coughing yesterday 100.3 temp this morning  Depression: a couple weeks that were bad last month Took a pen and jabbed her arm not cutting the skin but causing a bruise Her husband is aware of her ongoing depression Has not yet set up counseling appt Continues to have thoughts about not wanting to be here  Husband tested positive for flu A today  Relevant past medical, surgical, family and social history reviewed. Allergies and medications reviewed and updated. History  Smoking Status  . Never Smoker  Smokeless Tobacco  . Never Used   ROS: Per HPI   Objective:    BP 134/63 (BP Location: Left Arm, Patient Position: Sitting, Cuff Size: Normal)   Pulse (!) 110   Temp 99.9 F (37.7 C) (Oral)   Resp 20   Ht 5\' 5"  (1.651 m)   Wt 272 lb 9.6 oz (123.7 kg)   BMI 45.36 kg/m   Wt Readings from Last 3 Encounters:  02/28/16 272 lb 9.6 oz (123.7 kg)  01/21/16 269 lb 9.6 oz (122.3 kg)  12/27/15 268 lb (121.6 kg)    Gen: NAD, alert, cooperative with exam, NCAT, congested, warm EYES: EOMI, no conjunctival injection, or no icterus ENT:  TMs dull gray b/l, OP without erythema LYMPH: no cervical LAD CV: NRRR, normal S1/S2, no murmur, distal pulses 2+ b/l Resp: CTABL, no wheezes, normal WOB Abd: +BS, soft, NTND.  Ext: No edema, warm Neuro: Alert and oriented Psych: tearful at times, full affect, +passive SI  Assessment & Plan:  Sherri Howard was seen today for uri.  Diagnoses and all orders for this visit:  Influenza Husband tested positive for flu A today, will treat given fever -      oseltamivir (TAMIFLU) 75 MG capsule; Take 1 capsule (75 mg total) by mouth 2 (two) times daily.  ST (sore throat) Rapid negative, send for culture -     Veritor Flu A/B Waived -     Rapid strep screen (not at [facility]) -     Culture, Group A Strep  Fever and chills -     Veritor Flu A/B Waived -     Rapid strep screen (not at [facility])  Body aches -     Veritor Flu A/B Waived -     Rapid strep screen (not at [facility])  Depression, recurrent ([place]) Worsening symptoms past coupl eof months, now slightly better but still not controlled On viibryd Helping some Discussed again want her in counseling Gave contact information to call counselor today to set up appt Pt OK'ed discussing with husband any worsening signs of depression she needs to be seen, she says she has been talking with him and he has been very supportive Any worsening symptoms needs to be seen Has f/u upcoming next couple weeks with me  I spent 25 minutes with the patient with over 50% of the encounter time dedicated to counseling on the above problems.\  Follow up plan: 2 weeks Assunta Found, MD [place]

## 2016-02-28 NOTE — Telephone Encounter (Signed)
thanks

## 2016-02-28 NOTE — Telephone Encounter (Signed)
Spoke to pt and advised we would need to know exactly what to write on the rx or she could call her pulmonologist or have another sleep study since her last one was in 2000. Pt states she will call us back.

## 2016-02-28 NOTE — Patient Instructions (Addendum)
Netipot with distilled water 2-3 times a day to clear out sinuses Or Normal saline nasal spray Flonase steroid nasal spray Antihistamine daily such as cetirizine Lots of fluids  [email or web address]  [facility]   [phone] Crisis Recovery in [place]   [place] [address], [place], [place] Medication management, substance abuse, bipolar, grief, family, marriage, OCD, anxiety, PTSD Sees children / Accepts Medicaid

## 2016-02-28 NOTE — Telephone Encounter (Signed)
Please address

## 2016-02-28 NOTE — Addendum Note (Signed)
Addended by: Marylin Crosby on: 02/28/2016 05:43 PM   Modules accepted: Orders

## 2016-03-01 ENCOUNTER — Telehealth: Payer: Self-pay | Admitting: Pediatrics

## 2016-03-02 LAB — CULTURE, GROUP A STREP: Strep A Culture: NEGATIVE

## 2016-03-03 ENCOUNTER — Encounter: Payer: Self-pay | Admitting: Pediatrics

## 2016-03-03 ENCOUNTER — Ambulatory Visit (INDEPENDENT_AMBULATORY_CARE_PROVIDER_SITE_OTHER): Payer: BLUE CROSS/BLUE SHIELD | Admitting: Pediatrics

## 2016-03-03 VITALS — BP 122/77 | HR 79 | Temp 97.6°F | Ht 65.0 in | Wt 270.0 lb

## 2016-03-03 DIAGNOSIS — J069 Acute upper respiratory infection, unspecified: Secondary | ICD-10-CM | POA: Diagnosis not present

## 2016-03-03 DIAGNOSIS — G8929 Other chronic pain: Secondary | ICD-10-CM | POA: Diagnosis not present

## 2016-03-03 DIAGNOSIS — M545 Low back pain: Secondary | ICD-10-CM | POA: Diagnosis not present

## 2016-03-03 MED ORDER — HYDROCODONE-ACETAMINOPHEN 5-325 MG PO TABS: 1.0000 | ORAL_TABLET | Freq: Every day | ORAL | 0 refills | Status: DC | PRN

## 2016-03-03 NOTE — Progress Notes (Signed)
  Subjective:   [name], female    DOB: 1955-09-14, 61 y.o.   MRN: [id number] CC: Cough (left side pain ) and Nasal Congestion  HPI: Sherri Howard is a 61 y.o. female presenting for Cough (left side pain ) and Nasal Congestion  Treated last week for the flu 4 days ago Coughing a lot now Back pain there two days ago Does not have any back pain now Used heating pad  Continues to have intermittent pain in back and hips that limits daily ADLs Takes 1/2 to 1 hydrocodone rarely to help with pain  Pain assessment: Cause of pain- low back pain, degenerative back pain, coccydynia Frequency- has pain daily, not using pain medication every day What increases pain- walking, activity, coughing now worsening back pain What makes pain Better- rest, hydrocodone Effects on ADL - helps with getting up, moving around more Any change in general medical condition-no  Current medications- hydrocodone 5 mg prn Effectiveness of current meds- helps relieve pain, improves ability to do ADLs Adverse reactions form pain meds- none  Was the [place] reviewed- yes  If yes were their any concerning findings? - no   Relevant past medical, surgical, family and social history reviewed. Allergies and medications reviewed and updated. History  Smoking Status  . Never Smoker  Smokeless Tobacco  . Never Used   ROS: Per HPI   Objective:    BP 122/77 (BP Location: Left Arm)   Pulse 79   Temp 97.6 F (36.4 C) (Oral)   Ht 5\' 5"  (1.651 m)   Wt 270 lb (122.5 kg)   BMI 44.93 kg/m   Wt Readings from Last 3 Encounters:  03/03/16 270 lb (122.5 kg)  02/28/16 272 lb 9.6 oz (123.7 kg)  01/21/16 269 lb 9.6 oz (122.3 kg)    Gen: NAD, alert, cooperative with exam, NCAT, coughing EYES: EOMI, no conjunctival injection, or no icterus ENT:  TMs pearly gray b/l, OP without erythema, no ttp over sinuses LYMPH: no cervical LAD CV: NRRR, normal S1/S2, no murmur Resp: CTABL, no wheezes, normal WOB Ext:  No edema, warm Neuro: Alert and oriented MSK: no point tenderness over spine  Assessment & Plan:  Sherri Howard was seen today for cough and nasal congestion.  Diagnoses and all orders for this visit:  Acute URI Discussed symptom care, return precautions  Chronic midline low back pain without sciatica Does help with ADLs Do not take with xanax Not taking every day F/u in 4 weeks -     HYDROcodone-acetaminophen (NORCO/VICODIN) 5-325 MG tablet; Take 1 tablet by mouth daily as needed for moderate pain.   Follow up plan: Return in about 4 weeks (around 03/31/2016) for follow up pain. Assunta Found, MD [place]

## 2016-03-03 NOTE — Patient Instructions (Signed)
Netipot with distilled water 2-3 times a day to clear out sinuses Or Normal saline nasal spray Flonase steroid nasal spray Antihistamine daily such as cetirizine Lots of fluids  

## 2016-03-03 NOTE — Telephone Encounter (Signed)
appt made for today - for cough

## 2016-03-05 ENCOUNTER — Encounter: Payer: Self-pay | Admitting: Pediatrics

## 2016-04-03 ENCOUNTER — Encounter: Payer: Self-pay | Admitting: Pediatrics

## 2016-04-03 ENCOUNTER — Ambulatory Visit (INDEPENDENT_AMBULATORY_CARE_PROVIDER_SITE_OTHER): Payer: BLUE CROSS/BLUE SHIELD | Admitting: Pediatrics

## 2016-04-03 VITALS — BP 133/84 | HR 80 | Temp 97.9°F | Ht 65.0 in | Wt 275.8 lb

## 2016-04-03 DIAGNOSIS — F411 Generalized anxiety disorder: Secondary | ICD-10-CM

## 2016-04-03 DIAGNOSIS — F329 Major depressive disorder, single episode, unspecified: Secondary | ICD-10-CM | POA: Diagnosis not present

## 2016-04-03 DIAGNOSIS — F32A Depression, unspecified: Secondary | ICD-10-CM

## 2016-04-03 MED ORDER — ALPRAZOLAM 0.5 MG PO TABS: 1.0000 mg | ORAL_TABLET | Freq: Two times a day (BID) | ORAL | 1 refills | Status: DC | PRN

## 2016-04-03 MED ORDER — ALPRAZOLAM 0.5 MG PO TABS: 0.5000 mg | ORAL_TABLET | Freq: Two times a day (BID) | ORAL | 1 refills | Status: DC | PRN

## 2016-04-03 NOTE — Patient Instructions (Addendum)
[email or web address]  Your provider wants you to schedule an appointment with a Psychologist/Psychiatrist. The following list of offices requires the patient to call and make their own appointment, as there is information they need that only you can provide. Please feel free to choose form the following providers:  [facility] in [place]  [place]  [phone] [place], [place]  (Scheduled through [place]) Must call and do an interview for appointment. Sees Children / Accepts Medicaid  [facility] in [place]  [address] [place], [place]  [phone] [place], [place] for Autism but does not treat it Sees Children / Accepts Medicaid  [facility]    [phone] [address] [place], [place] Medication management, substance abuse, bipolar, grief, family, marriage, OCD, anxiety, PTSD Sees children / Accepts Medicaid  Cetirizine or zyrtec for allergies

## 2016-04-03 NOTE — Progress Notes (Signed)
  Subjective:   [name], female    DOB: [birth year], 61 y.o.   MRN: [id number] CC: Depression  HPI: Sherri Howard is a 61 y.o. female presenting for Depression  Depression: ongoing feelings of sadness, crying a few times a week Feels like a failure as a wife and mother Husband has been very supportive she says Feels safe at home, no self harm, no plans for self harm Keeping track of her symptoms Talks a lot with her aunt A couple of times has panicked when driving at night when she suddenly realizes she doesn't know where she is going for a few seconds, but then recognizes where she is, has always been headed in the right direction Thinks she has been tried on sertraline in the past, multiple other SSRI  Has been taking xanax more often, apprx 2-3 times a week  Relevant past medical, surgical, family and social history reviewed. Allergies and medications reviewed and updated. History  Smoking Status  . Never Smoker  Smokeless Tobacco  . Never Used   ROS: Per HPI   Objective:    BP 133/84   Pulse 80   Temp 97.9 F (36.6 C) (Oral)   Ht 5\' 5"  (1.651 m)   Wt 275 lb 12.8 oz (125.1 kg)   BMI 45.90 kg/m   Wt Readings from Last 3 Encounters:  04/04/16 275 lb (124.7 kg)  04/03/16 275 lb 12.8 oz (125.1 kg)  03/03/16 270 lb (122.5 kg)    Gen: NAD, alert, cooperative with exam, NCAT EYES: EOMI, no conjunctival injection, or no icterus ENT: OP without erythema LYMPH: no cervical LAD CV: NRRR, normal S1/S2, no murmur, distal pulses 2+ b/l Resp: CTABL, no wheezes, normal WOB Abd: +BS, soft, NTND. no guarding or organomegaly Ext: No edema, warm Neuro: Alert and oriented Psych: normal affect, no plans for self harm, +passive SI about not wanting to be here, no different from last few months  Assessment & Plan:  Sherri Howard was seen today for depression.  Diagnoses and all orders for this visit:  Depression, unspecified depression type worsening symptoms Feels safe  at home, no plans for self harm, continues to have passive thoughts of not wanting to be here anymore Has good support from family On viibryd 40mg , wellbutrin Has been tried on multiple SSRIs, SNRIs in the past Still not in counseling Pt more agreeable to counseling today, gave phone numbers, pt to call to set up appt [facility] -     Ambulatory referral to Psychiatry  Generalized anxiety disorder Xanax no more than 2-3 times a week Pt to call for counseling appt, referral in to psychiatry -     Ambulatory referral to Psychiatry -     ALPRAZolam (XANAX) 0.5 MG tablet; Take 1-2 tablets (0.5-1 mg total) by mouth 2 (two) times daily as needed.  I spent 25 minutes with the patient with over 50% of the encounter time dedicated to counseling on the above problems.   Follow up plan: 1 mo Sherri Found, MD [place]

## 2016-04-04 ENCOUNTER — Ambulatory Visit (INDEPENDENT_AMBULATORY_CARE_PROVIDER_SITE_OTHER): Payer: BLUE CROSS/BLUE SHIELD | Admitting: Cardiology

## 2016-04-04 ENCOUNTER — Encounter: Payer: Self-pay | Admitting: Cardiology

## 2016-04-04 VITALS — BP 142/83 | HR 78 | Ht 65.0 in | Wt 275.0 lb

## 2016-04-04 DIAGNOSIS — R0602 Shortness of breath: Secondary | ICD-10-CM

## 2016-04-04 NOTE — Progress Notes (Signed)
Clinical Summary Sherri Howard is a 61 y.o.female seen as a new patient for SOB. She is referred by Dr Sherri Howard.   1. SOB - echo 09/2015 LVEF 60-65%, no WMAs, grade I diastolic dysfunction, mild AI - 12/2015 CXR no acute process.   - SOB started about 1 year ago. DOE with activities. DOE at about 1 block. Also DOE with doing laundry - no chest pain. Previous palpitations though not recent. Occasional LE edema, occasional edema in hands - occasional cough, wheezing.  CAD risk factors: HTN, father with previous stents and CABG in his 77s. Maternal uncle with pacemaker.  - Jan 2018: nuclear stress test without ischemia - breathing mildly improved since last visit.   2. HTN - compliant with meds   Past Medical History:  Diagnosis Date  . Hypertension   . Kidney stones      Allergies  Allergen Reactions  . Ciprofloxacin Rash  . Sulfur Rash  . Latex   . Prednisolone Other (See Comments)    Convulsions per pt  . Sulfa Antibiotics   . Influenza Vaccines Rash  . Oxycodone-Acetaminophen Rash  . Pneumococcal Vaccine Rash     Current Outpatient Prescriptions  Medication Sig Dispense Refill  . ALPRAZolam (XANAX) 0.5 MG tablet Take 1-2 tablets (0.5-1 mg total) by mouth 2 (two) times daily as needed. 60 tablet 1  . buPROPion (WELLBUTRIN SR) 150 MG 12 hr tablet Take 1 tablet (150 mg total) by mouth 2 (two) times daily. 180 tablet 1  . Cholecalciferol (VITAMIN D3) 5000 units TABS Take by mouth.    . diltiazem (CARDIZEM CD) 240 MG 24 hr capsule Take 1 capsule (240 mg total) by mouth daily. 90 capsule 1  . docusate sodium (COLACE) 100 MG capsule Take 100 mg by mouth as needed for mild constipation.    . folic acid (FOLVITE) 756 MCG tablet Take 400 mcg by mouth daily.    Marland Kitchen HYDROcodone-acetaminophen (NORCO/VICODIN) 5-325 MG tablet Take 1 tablet by mouth daily as needed for moderate pain. 30 tablet 0  . hyoscyamine (LEVSIN, ANASPAZ) 0.125 MG tablet Take 0.125 mg by mouth as  needed.    Marland Kitchen losartan (COZAAR) 50 MG tablet Take 1 tablet (50 mg total) by mouth daily. 90 tablet 1  . magnesium oxide (MAG-OX) 400 MG tablet Take 400 mg by mouth.    . methylcellulose oral powder Take by mouth daily.    . Multiple Vitamins-Minerals (HAIR SKIN AND NAILS FORMULA PO) Take 1 tablet by mouth daily.    Marland Kitchen oxybutynin (DITROPAN-XL) 10 MG 24 hr tablet Take 1 tablet (10 mg total) by mouth 2 (two) times daily. 180 tablet 1  . VIIBRYD 40 MG TABS Take 1 tablet (40 mg total) by mouth daily. 90 tablet 1   No current facility-administered medications for this visit.      Past Surgical History:  Procedure Laterality Date  . APPENDECTOMY    . CHOLECYSTECTOMY    . HERNIA REPAIR    . TONSILLECTOMY  05/1973     Allergies  Allergen Reactions  . Ciprofloxacin Rash  . Sulfur Rash  . Latex   . Prednisolone Other (See Comments)    Convulsions per pt  . Sulfa Antibiotics   . Influenza Vaccines Rash  . Oxycodone-Acetaminophen Rash  . Pneumococcal Vaccine Rash      Family History  Problem Relation Age of Onset  . Cancer Mother   . Aneurysm Mother   . Cancer Father   . Hypertension  Father   . Alzheimer's disease Father      Social History Sherri Howard reports that she has never smoked. She has never used smokeless tobacco. Sherri Howard reports that she does not drink alcohol.   Review of Systems CONSTITUTIONAL: No weight loss, fever, chills, weakness or fatigue.  HEENT: Eyes: No visual loss, blurred vision, double vision or yellow sclerae.No hearing loss, sneezing, congestion, runny nose or sore throat.  SKIN: No rash or itching.  CARDIOVASCULAR: per HPI RESPIRATORY: No shortness of breath, cough or sputum.  GASTROINTESTINAL: No anorexia, nausea, vomiting or diarrhea. No abdominal pain or blood.  GENITOURINARY: No burning on urination, no polyuria NEUROLOGICAL: No headache, dizziness, syncope, paralysis, ataxia, numbness or tingling in the extremities. No change in bowel or  bladder control.  MUSCULOSKELETAL: No muscle, back pain, joint pain or stiffness.  LYMPHATICS: No enlarged nodes. No history of splenectomy.  PSYCHIATRIC: No history of depression or anxiety.  ENDOCRINOLOGIC: No reports of sweating, cold or heat intolerance. No polyuria or polydipsia.  Marland Kitchen   Physical Examination Vitals:   04/04/16 1312  BP: (!) 142/83  Pulse: 78   Vitals:   04/04/16 1312  Weight: 275 lb (124.7 kg)  Height: 5\' 5"  (1.651 m)    Gen: resting comfortably, no acute distress HEENT: no scleral icterus, pupils equal round and reactive, no palptable cervical adenopathy,  CV: RRR, no m/r/g, no jvd Resp: Clear to auscultation bilaterally GI: abdomen is soft, non-tender, non-distended, normal bowel sounds, no hepatosplenomegaly MSK: extremities are warm, no edema.  Skin: warm, no rash Neuro:  no focal deficits Psych: appropriate affect   Diagnostic Studies 09/2015 echo Study Conclusions  - Left ventricle: The cavity size was normal. There was mild   concentric hypertrophy. Systolic function was normal. The   estimated ejection fraction was in the range of 60% to 65%. Wall   motion was normal; there were no regional wall motion   abnormalities. Doppler parameters are consistent with abnormal   left ventricular relaxation (grade 1 diastolic dysfunction). - Aortic valve: Mildly calcified annulus. Trileaflet; mildly   thickened leaflets. There was mild regurgitation.   Jan 2018 Nuclear stress  Blood pressure demonstrated a normal response to exercise.  The study is normal.  This is a low risk study.  Nuclear stress EF: 74%.       Assessment and Plan  1. SOB/DOE - no evidence of clear cardiac cause based one cho and stress testing - we discussed possible PFTs, however patient would like to hold off for now.  - no further cardiac testing planned at this time. Defer further workup to pcp  2. Morbid obesity - BMI is 46. Counseled on exercise, diet, weight  loss. A large part of her SOB may be related to her weight and deconditioning.    F/u as needed.       Arnoldo Lenis, M.D.

## 2016-04-04 NOTE — Patient Instructions (Signed)
Your physician recommends that you schedule a follow-up appointment AS NEEDED WITH DR. BRANCH  Your physician recommends that you continue on your current medications as directed. Please refer to the Current Medication list given to you today.  Thank you for choosing [facility]!!   

## 2016-04-08 ENCOUNTER — Encounter: Payer: Self-pay | Admitting: Pediatrics

## 2016-04-08 ENCOUNTER — Other Ambulatory Visit: Payer: Self-pay | Admitting: *Deleted

## 2016-04-08 DIAGNOSIS — F339 Major depressive disorder, recurrent, unspecified: Secondary | ICD-10-CM

## 2016-04-14 ENCOUNTER — Ambulatory Visit (INDEPENDENT_AMBULATORY_CARE_PROVIDER_SITE_OTHER): Payer: BLUE CROSS/BLUE SHIELD | Admitting: Pediatrics

## 2016-04-14 ENCOUNTER — Other Ambulatory Visit: Payer: Self-pay | Admitting: *Deleted

## 2016-04-14 ENCOUNTER — Encounter: Payer: Self-pay | Admitting: Pediatrics

## 2016-04-14 ENCOUNTER — Ambulatory Visit (INDEPENDENT_AMBULATORY_CARE_PROVIDER_SITE_OTHER): Payer: BLUE CROSS/BLUE SHIELD

## 2016-04-14 VITALS — BP 139/88 | HR 79 | Temp 97.7°F | Ht 65.0 in | Wt 276.4 lb

## 2016-04-14 DIAGNOSIS — N3289 Other specified disorders of bladder: Secondary | ICD-10-CM

## 2016-04-14 DIAGNOSIS — M25571 Pain in right ankle and joints of right foot: Secondary | ICD-10-CM | POA: Diagnosis not present

## 2016-04-14 DIAGNOSIS — Z0000 Encounter for general adult medical examination without abnormal findings: Secondary | ICD-10-CM

## 2016-04-14 LAB — BAYER DCA HB A1C WAIVED: HB A1C (BAYER DCA - WAIVED): 5.8 % (ref <7.0)

## 2016-04-14 MED ORDER — MIRABEGRON ER 25 MG PO TB24: 25.0000 mg | ORAL_TABLET | Freq: Every day | ORAL | 2 refills | Status: DC

## 2016-04-14 NOTE — Patient Instructions (Signed)
Call [facility] Phone: [phone] to set up appointment

## 2016-04-14 NOTE — Progress Notes (Signed)
  Subjective:   [name], female    DOB: 14-Mar-1955, 61 y.o.   MRN: [id number] CC: Annual Exam  HPI: Sherri Howard is a 61 y.o. female presenting for Annual Exam  Had episode of room spinning that lasted for a few minutes, started while getting into water bed Second episode for less than a minute Was sitting down when it happened both times  HTN: takes meds daily  Bladder spasms: taking oxybutynin regularly, causes dry mouth  Depression: still with good days and bad days, no worsening in symptoms Has phone number to call and schedule counseling appt  Ankle pain: Slipped in snow 1 week ago, pain on outside of R ankle since then Can weight bear, walking long distances bothers her  Relevant past medical, surgical, family and social history reviewed. Allergies and medications reviewed and updated. History  Smoking Status  . Never Smoker  Smokeless Tobacco  . Never Used   ROS: All systems negative other than what is in HPI  Objective:    BP 139/88   Pulse 79   Temp 97.7 F (36.5 C) (Oral)   Ht '5\' 5"'$  (1.651 m)   Wt 276 lb 6.4 oz (125.4 kg)   BMI 46.00 kg/m   Wt Readings from Last 3 Encounters:  04/14/16 276 lb 6.4 oz (125.4 kg)  04/04/16 275 lb (124.7 kg)  04/03/16 275 lb 12.8 oz (125.1 kg)   Gen: NAD, alert, cooperative with exam, NCAT EYES: EOMI, no conjunctival injection, or no icterus ENT:  TMs pearly gray b/l, OP without erythema LYMPH: no cervical LAD CV: NRRR, normal S1/S2, no murmur, distal pulses 2+ b/l Resp: CTABL, no wheezes, normal WOB Abd: +BS, soft, mildly tender L side with deep palpation, ND. no guarding Ext: No edema, warm Neuro: Alert and oriented MSK: R ankle slightly swollen lateral malleolus compared with L TTP over lateral malleolus and distal fibula No ttp over peroneal tendon, base of 5th metatarsal Pain with inversion of ankle, minimal pain with eversion, dorsi, plantar flex  Declines breast exam today  Assessment & Plan:   Sherri Howard was seen today for annual exam.  Diagnoses and all orders for this visit:  Encounter for preventive health examination Mammogram: has been a long time Colonoscopy: has had 30 cm resection for polyps Due 2018 for repeat Pap smear: due, declines today Hep C screen: pt declined -     Bayer DCA Hb A1c Waived -     BMP8+EGFR -     Lipid panel -     MM Digital Screening; Future -     Ambulatory referral to Gastroenterology  Bladder spasm Stop oxybutynin, causing dry mouth Trial of below -     mirabegron ER (MYRBETRIQ) 25 MG TB24 tablet; Take 1 tablet (25 mg total) by mouth daily.  Acute right ankle pain Xray today -     DG Ankle Complete Right; Future   Follow up plan: 3 mo, sooner if needed Assunta Found, MD [place]

## 2016-04-15 LAB — LIPID PANEL
Chol/HDL Ratio: 3.4 ratio (ref 0.0–4.4)
Cholesterol, Total: 193 mg/dL (ref 100–199)
HDL: 57 mg/dL (ref >39)
LDL Calculated: 98 mg/dL (ref 0–99)
Triglycerides: 191 mg/dL — ABNORMAL HIGH (ref 0–149)
VLDL Cholesterol Cal: 38 mg/dL (ref 5–40)

## 2016-04-15 LAB — BMP8+EGFR
BUN/Creatinine Ratio: 15 (ref 9–23)
BUN: 16 mg/dL (ref 6–24)
CALCIUM: 9.8 mg/dL (ref 8.7–10.2)
CO2: 26 mmol/L (ref 18–29)
Chloride: 101 mmol/L (ref 96–106)
Creatinine, Ser: 1.06 mg/dL — ABNORMAL HIGH (ref 0.57–1.00)
GFR, EST AFRICAN AMERICAN: 67 mL/min/{1.73_m2} (ref >59)
GFR, EST NON AFRICAN AMERICAN: 58 mL/min/{1.73_m2} — AB (ref >59)
Glucose: 82 mg/dL (ref 65–99)
POTASSIUM: 5.3 mmol/L — AB (ref 3.5–5.2)
SODIUM: 142 mmol/L (ref 134–144)

## 2016-04-17 ENCOUNTER — Encounter: Payer: Self-pay | Admitting: Pediatrics

## 2016-04-23 ENCOUNTER — Telehealth: Payer: Self-pay | Admitting: Pediatrics

## 2016-04-23 ENCOUNTER — Encounter: Payer: Self-pay | Admitting: Pediatrics

## 2016-04-23 DIAGNOSIS — N3281 Overactive bladder: Secondary | ICD-10-CM

## 2016-04-23 NOTE — Telephone Encounter (Signed)
Patient was seen 4/2 and forgot to tell Dr. Evette Doffing she was having LLQ pain and wanted to know if she could come and leave a UA? Patient also states that mirabegron is too expensive and wants to know if cutting her Oxybutynin to daily instead of BID will help? Patient states that the samples of the mirabegron was helping with her dry mouth. Please advise.

## 2016-04-24 MED ORDER — DARIFENACIN HYDROBROMIDE ER 7.5 MG PO TB24: 7.5000 mg | ORAL_TABLET | Freq: Every day | ORAL | 1 refills | Status: DC

## 2016-04-24 NOTE — Telephone Encounter (Signed)
I closed this note too soon

## 2016-04-25 NOTE — Telephone Encounter (Signed)
Samples placed at front desk.  

## 2016-04-29 ENCOUNTER — Encounter: Payer: Self-pay | Admitting: Pediatrics

## 2016-05-07 ENCOUNTER — Encounter: Payer: Self-pay | Admitting: Pediatrics

## 2016-05-12 DIAGNOSIS — G4733 Obstructive sleep apnea (adult) (pediatric): Secondary | ICD-10-CM | POA: Diagnosis not present

## 2016-05-13 ENCOUNTER — Encounter: Payer: Self-pay | Admitting: Pediatrics

## 2016-05-13 DIAGNOSIS — H35363 Drusen (degenerative) of macula, bilateral: Secondary | ICD-10-CM | POA: Diagnosis not present

## 2016-05-15 ENCOUNTER — Ambulatory Visit (INDEPENDENT_AMBULATORY_CARE_PROVIDER_SITE_OTHER): Payer: BLUE CROSS/BLUE SHIELD | Admitting: Pediatrics

## 2016-05-15 ENCOUNTER — Encounter: Payer: Self-pay | Admitting: Pediatrics

## 2016-05-15 VITALS — BP 121/70 | HR 89 | Temp 99.4°F | Ht 65.0 in | Wt 276.8 lb

## 2016-05-15 DIAGNOSIS — F329 Major depressive disorder, single episode, unspecified: Secondary | ICD-10-CM | POA: Diagnosis not present

## 2016-05-15 DIAGNOSIS — F411 Generalized anxiety disorder: Secondary | ICD-10-CM | POA: Diagnosis not present

## 2016-05-15 DIAGNOSIS — F32A Depression, unspecified: Secondary | ICD-10-CM

## 2016-05-15 MED ORDER — BUSPIRONE HCL 5 MG PO TABS: 5.0000 mg | ORAL_TABLET | Freq: Three times a day (TID) | ORAL | 1 refills | Status: DC

## 2016-05-15 MED ORDER — ALPRAZOLAM 0.5 MG PO TABS: 0.5000 mg | ORAL_TABLET | Freq: Two times a day (BID) | ORAL | 1 refills | Status: DC | PRN

## 2016-05-15 NOTE — Progress Notes (Signed)
Subjective:   [name], female    DOB: 02/19/1955, 61 y.o.   MRN: [id number] CC: Follow-up (Medication) anxiety/depression HPI: Sherri Howard is a 61 y.o. female presenting for Follow-up (Medication)  Has been crying every day Taking xanax once to twice a day when she takes it Still not more than a couple days a week Continues to take wellbutrin, viibryd Has been tried on multiple other SSRIs/SNRIs in the past Feels safe at  Home, no suicidal ideation Has had a few episodes of hitting herself in the arm last week  Continues to feel very anxious Has not been able to set up appt with counselor yet  Depression screen [facility] 2/9 05/15/2016 04/14/2016 04/03/2016 03/03/2016 02/28/2016  Decreased Interest 2 2 2  0 1  Down, Depressed, Hopeless 3 3 3 1 2   PHQ - 2 Score 5 5 5 1 3   Altered sleeping 1 1 1  - 2  Tired, decreased energy 2 3 3  - 2  Change in appetite 1 1 1  - 1  Feeling bad or failure about yourself  3 1 2  - 2  Trouble concentrating 2 1 1  - 1  Moving slowly or fidgety/restless 1 1 1  - 1  Suicidal thoughts 1 0 1 - 1  PHQ-9 Score 16 13 15  - 13  Difficult doing work/chores Very difficult - Very difficult - Somewhat difficult  Some recent data might be hidden   GAD 7 : Generalized Anxiety Score 05/15/2016 09/20/2015 08/24/2015 08/09/2015  Nervous, Anxious, on Edge 2 2 1 1   Control/stop worrying 2 2 1 1   Worry too much - different things 2 2 1 1   Trouble relaxing 2 3 0 0  Restless 0 2 0 0  Easily annoyed or irritable 0 1 1 1   Afraid - awful might happen 0 1 1 1   Total GAD 7 Score 8 13 5 5   Anxiety Difficulty Somewhat difficult Somewhat difficult Somewhat difficult Somewhat difficult    Relevant past medical, surgical, family and social history reviewed. Allergies and medications reviewed and updated. History  Smoking Status  . Never Smoker  Smokeless Tobacco  . Never Used   ROS: Per HPI   Objective:    BP 121/70   Pulse 89   Temp 99.4 F (37.4 C) (Oral)   Ht 5'  5" (1.651 m)   Wt 276 lb 12.8 oz (125.6 kg)   BMI 46.06 kg/m   Wt Readings from Last 3 Encounters:  05/15/16 276 lb 12.8 oz (125.6 kg)  04/14/16 276 lb 6.4 oz (125.4 kg)  04/04/16 275 lb (124.7 kg)    Gen: NAD, alert, cooperative with exam, NCAT EYES: EOMI, no conjunctival injection, or no icterus ENT:  TMs pearly gray b/l, OP without erythema LYMPH: no cervical LAD CV: NRRR, normal S1/S2, no murmur, distal pulses 2+ b/l Resp: CTABL, no wheezes, normal WOB Abd: +BS, soft, NTND.  Ext: No edema, warm Neuro: Alert and oriented, strength equal b/l UE and LE, coordination grossly normal Psych: normal affect, tearful at times  Assessment & Plan:  Lujuana was seen today for follow-up multiple med problems Diagnoses and all orders for this visit:  Depression, unspecified depression type Worsening symptoms Cont viibryd, wellbutrin Starting buspar for anxiety as below Discussed need her to call to set up counselor appt ASAP Referral placed to psychiatrist of her choice in [place] -     Ambulatory referral to Psychiatry  Generalized anxiety disorder Worsening symptoms, though GAD slightly improved Continues to try  to minimize xanax to not every day Will start buspar TID as below Referral to psych as above, pt to call for counseling Any worsening in symptoms let m eknow Says she feels safe to self at home, has not had any suicidal ideation, does have passive SI about wishing not to be here -     ALPRAZolam (XANAX) 0.5 MG tablet; Take 1-2 tablets (0.5-1 mg total) by mouth 2 (two) times daily as needed. -     busPIRone (BUSPAR) 5 MG tablet; Take 1 tablet (5 mg total) by mouth 3 (three) times daily. -     Ambulatory referral to Psychiatry  I spent 25 minutes with the patient with over 50% of the encounter time dedicated to counseling on the above problems.   Follow up plan: Return in about 4 weeks (around 06/12/2016). Assunta Found, MD [place]

## 2016-05-16 ENCOUNTER — Other Ambulatory Visit: Payer: Self-pay | Admitting: *Deleted

## 2016-05-16 DIAGNOSIS — Z1231 Encounter for screening mammogram for malignant neoplasm of breast: Secondary | ICD-10-CM

## 2016-05-20 DIAGNOSIS — G4733 Obstructive sleep apnea (adult) (pediatric): Secondary | ICD-10-CM | POA: Diagnosis not present

## 2016-05-22 ENCOUNTER — Other Ambulatory Visit: Payer: Self-pay | Admitting: Pediatrics

## 2016-05-22 DIAGNOSIS — N3281 Overactive bladder: Secondary | ICD-10-CM

## 2016-05-28 ENCOUNTER — Encounter: Payer: Self-pay | Admitting: Pediatrics

## 2016-05-28 DIAGNOSIS — N3281 Overactive bladder: Secondary | ICD-10-CM

## 2016-05-28 DIAGNOSIS — Z1231 Encounter for screening mammogram for malignant neoplasm of breast: Secondary | ICD-10-CM | POA: Diagnosis not present

## 2016-05-29 DIAGNOSIS — N201 Calculus of ureter: Secondary | ICD-10-CM | POA: Diagnosis not present

## 2016-05-29 DIAGNOSIS — N200 Calculus of kidney: Secondary | ICD-10-CM | POA: Diagnosis not present

## 2016-05-29 MED ORDER — DARIFENACIN HYDROBROMIDE ER 7.5 MG PO TB24: 7.5000 mg | ORAL_TABLET | Freq: Every day | ORAL | 1 refills | Status: DC

## 2016-05-30 DIAGNOSIS — N200 Calculus of kidney: Secondary | ICD-10-CM | POA: Diagnosis not present

## 2016-06-03 ENCOUNTER — Encounter: Payer: Self-pay | Admitting: Pediatrics

## 2016-06-05 ENCOUNTER — Ambulatory Visit (INDEPENDENT_AMBULATORY_CARE_PROVIDER_SITE_OTHER): Payer: BLUE CROSS/BLUE SHIELD

## 2016-06-05 ENCOUNTER — Encounter: Payer: Self-pay | Admitting: Pediatrics

## 2016-06-05 ENCOUNTER — Ambulatory Visit (INDEPENDENT_AMBULATORY_CARE_PROVIDER_SITE_OTHER): Payer: BLUE CROSS/BLUE SHIELD | Admitting: Pediatrics

## 2016-06-05 VITALS — BP 127/79 | HR 75 | Temp 97.8°F | Resp 18 | Ht 65.0 in | Wt 275.8 lb

## 2016-06-05 DIAGNOSIS — I10 Essential (primary) hypertension: Secondary | ICD-10-CM

## 2016-06-05 DIAGNOSIS — R0609 Other forms of dyspnea: Secondary | ICD-10-CM | POA: Diagnosis not present

## 2016-06-05 MED ORDER — LOSARTAN POTASSIUM 100 MG PO TABS: 100.0000 mg | ORAL_TABLET | Freq: Every day | ORAL | 3 refills | Status: DC

## 2016-06-05 NOTE — Progress Notes (Signed)
  Subjective:   [name], female    DOB: 03-16-1955, 61 y.o.   MRN: [id number] CC: Shortness of Breath; Extremity Weakness; and Muscle Pain  HPI: Sherri Howard is a 61 y.o. female presenting for Shortness of Breath; Extremity Weakness; and Muscle Pain  Bothered by the shortness of breath more often Has been going on off and on for about a year Has to sit down more often Putting things in the laundry, has to stop more often than usual  Was seen by cardiology, had ECHO with grade I diastolic dysfinction Stress test without ischemia  Says has been checking O2 sat at home with home meter, is between 92-94% when she checks, only checking when she feels "weird" Sometimes just normal conversation she will feel winded and breathless No h/o smoking or prior lung problems  OSA: wears CPAP at night, last sleep study in 2000, normal SaO2 then per chart review  HTN: usually 120-140 at home, rarely up to the 150s  Relevant past medical, surgical, family and social history reviewed. Allergies and medications reviewed and updated. History  Smoking Status  . Never Smoker  Smokeless Tobacco  . Never Used   ROS: Per HPI   Objective:    BP 127/79   Pulse 75   Temp 97.8 F (36.6 C) (Oral)   Resp 18   Ht 5\' 5"  (1.651 m)   Wt 275 lb 12.8 oz (125.1 kg)   SpO2 94%   BMI 45.90 kg/m   Wt Readings from Last 3 Encounters:  06/05/16 275 lb 12.8 oz (125.1 kg)  05/15/16 276 lb 12.8 oz (125.6 kg)  04/14/16 276 lb 6.4 oz (125.4 kg)  walking O2 sat started at 97% at rest, dropped to 90% after 1 min of walking   Gen: NAD, alert, cooperative with exam, NCAT EYES: EOMI, no conjunctival injection, or no icterus ENT:  OP without erythema LYMPH: no cervical LAD CV: NRRR, normal S1/S2, no murmur, distal pulses 2+ b/l Resp: CTABL, no wheezes, normal WOB, slightly breathless with speaking full sentences Abd: +BS, soft, NTND. Ext: No edema, warm Neuro: Alert and oriented MSK: normal  muscle bulk  CXR: no infiltrates  Assessment & Plan:  Talin was seen today for shortness of breath, fatigue  Diagnoses and all orders for this visit:  DOE (dyspnea on exertion) Ongoing for about a year, no ischemia on stress test Oxygen levels drop with walking, not below 90% CXR without opacities, will refer to pulm -     Ambulatory referral to Pulmonology -     DG Chest 2 View; Future  Essential hypertension Remain 120s-140s at home, will increase losartan -     losartan (COZAAR) 100 MG tablet; Take 1 tablet (100 mg total) by mouth daily.  Follow up plan: Return in about 4 weeks (around 07/03/2016). Assunta Found, MD [place]

## 2016-06-05 NOTE — Patient Instructions (Signed)
Continue to check blood pressures at home regularly Let me know if elevated

## 2016-06-16 ENCOUNTER — Encounter: Payer: Self-pay | Admitting: Pediatrics

## 2016-06-17 ENCOUNTER — Ambulatory Visit (INDEPENDENT_AMBULATORY_CARE_PROVIDER_SITE_OTHER): Payer: BLUE CROSS/BLUE SHIELD | Admitting: Pediatrics

## 2016-06-17 ENCOUNTER — Encounter: Payer: Self-pay | Admitting: Pediatrics

## 2016-06-17 VITALS — BP 121/74 | HR 92 | Temp 97.8°F | Ht 65.0 in | Wt 273.4 lb

## 2016-06-17 DIAGNOSIS — F329 Major depressive disorder, single episode, unspecified: Secondary | ICD-10-CM

## 2016-06-17 DIAGNOSIS — R682 Dry mouth, unspecified: Secondary | ICD-10-CM

## 2016-06-17 DIAGNOSIS — I10 Essential (primary) hypertension: Secondary | ICD-10-CM

## 2016-06-17 DIAGNOSIS — F32A Depression, unspecified: Secondary | ICD-10-CM

## 2016-06-17 DIAGNOSIS — N3281 Overactive bladder: Secondary | ICD-10-CM | POA: Diagnosis not present

## 2016-06-17 NOTE — Progress Notes (Signed)
  Subjective:   [name], female    DOB: 03-Oct-1955, 61 y.o.   MRN: [id number] CC: Medication Reaction (tongue swelling, dry mouth)  HPI: Sherri Howard is a 61 y.o. female presenting for Medication Reaction (tongue swelling, dry mouth)  Thinks has been going several weeks to a at most a couple of months mouth very dry Not constant, cant think of anything that makes it worse Feels like her tongue isnt working right Took benadryl last night, thinks it helped decrease tongue size No other swelling in face  Has appt to see lung doctor upcoming for DOE  Continues to have SOB with exertion Checked her O2 right after waking up, was 87% O2 Usually in 92-93 Has had CPAP machine at home, uses nightly  Normal CTA for PE in 2014  Started on buspar and enablex most recent new drugs, started last month  Thinks buspar may be helping some with anxiety  Losartan she has been on for the past 5 months, was increased about a month ago  Relevant past medical, surgical, family and social history reviewed. Allergies and medications reviewed and updated. History  Smoking Status  . Never Smoker  Smokeless Tobacco  . Never Used   ROS: Per HPI   Objective:    BP 121/74   Pulse 92   Temp 97.8 F (36.6 C) (Oral)   Ht 5\' 5"  (1.651 m)   Wt 273 lb 6.4 oz (124 kg)   BMI 45.50 kg/m   Wt Readings from Last 3 Encounters:  06/17/16 273 lb 6.4 oz (124 kg)  06/05/16 275 lb 12.8 oz (125.1 kg)  05/15/16 276 lb 12.8 oz (125.6 kg)    Gen: NAD, alert, cooperative with exam, NCAT EYES: EOMI, no conjunctival injection, or no icterus, symmetric face ENT:  OP without erythema, normal appearing tongue LYMPH: no cervical LAD CV: NRRR, normal S1/S2, no murmur, distal pulses 2+ b/l Resp: CTABL, no wheezes, normal WOB Ext: No edema, warm Neuro: Alert and oriented, normal speech, strength equal b/l UE and LE  Assessment & Plan:  Deandria was seen today for medication reaction.  Diagnoses and  all orders for this visit:  Depression, unspecified depression type Slightly improved per pt, cont current medicines, pt to call for counseling appt  Overactive bladder Started enablex last visit, stop given ongoing symptoms as below  Essential hypertension Adequate control with losartan  Dry mouth No tongue swelling now Improved symptoms with benadryl Last visit started on buspar, enablex, losartan increased ARB unlikely to cause angioedema No history of prior episodes of angioedema Stop enablex RTC if swelling/fullness of tongue returns for labs Will stop buspar, consider changing ARB to alternate medication   Follow up plan: Return in about 4 weeks (around 07/15/2016). Sooner if needed. Assunta Found, MD [place]

## 2016-06-17 NOTE — Patient Instructions (Addendum)
stop enablex Call me if any more swelling

## 2016-06-19 ENCOUNTER — Other Ambulatory Visit: Payer: Self-pay | Admitting: Pulmonary Disease

## 2016-06-19 ENCOUNTER — Ambulatory Visit (INDEPENDENT_AMBULATORY_CARE_PROVIDER_SITE_OTHER): Payer: BLUE CROSS/BLUE SHIELD | Admitting: Pulmonary Disease

## 2016-06-19 ENCOUNTER — Encounter: Payer: Self-pay | Admitting: Pulmonary Disease

## 2016-06-19 VITALS — BP 132/83 | HR 83 | Ht 65.0 in | Wt 273.0 lb

## 2016-06-19 DIAGNOSIS — R0600 Dyspnea, unspecified: Secondary | ICD-10-CM

## 2016-06-19 NOTE — Patient Instructions (Signed)
Blood work today -based on this we will decide about doing confirmatory scan for blood clots Schedule PFTs

## 2016-06-19 NOTE — Assessment & Plan Note (Signed)
No clear cause identified today. History is very atypical for asthma. She is a never smoker. No evidence of pulmonary fibrosis on imaging studies or exam. Recent stress Myoview was low risk.  She does not have a family history of VTE or any clear risk factors. We will check d-dimer today and if positive will pursue CT angiogram to rule out pulmonary embolism. It was reassuring to note that she did not desaturate on walking today. We will obtain PFTs for completion and an echo to assess poorly artery pressure if symptoms are persistent. Otherwise, I would encourage her to undertake a conditioning program No evidence of neuromuscular disease on exam although some of her symptoms are suspicious

## 2016-06-19 NOTE — Progress Notes (Signed)
Subjective:    [name], female    DOB: 1955/12/11, 61 y.o.   MRN: [id number]  HPI  Chief Complaint  Patient presents with  . Pulm Consult    Increased SOB for the past few months. 2 days ago, discovered she was having an allergic reaction to Enablex, swelling of tongue.    61 year old obese never smoker referred for evaluation of dyspnea on exertion. 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 She reports increasing shortness of breath for the last 6 months, this was progressed to dyspnea on usual activities. She denies associated chest pain, orthopnea or paroxysmal nocturnal dyspnea. She has an occasional dry cough and intermittent wheezing. Dyspnea gets worse with activity and subsides with rest. She also reports difficulty swallowing especially late evenings and she feels occasionally that her jaw muscles are tired of chewing . She denies double vision or blurred vision or muscle fasciculations. She has a pulse oximeter and has noted her oxygen levels to be as low as 87% on one occasion and sometimes between 90-95%  She has a hypertensive controlled on 2 medications. She has OSA for many years or sooner diagnosed in 2000 maintained on CPAP of 7 cm with nasal mask DME is [facility], she reports compliance with her machine   She does not have a history of VT or family history. She did have at car drive to [place] about 3 months ago. She has a family history of breast cancer in her mother and bladder cancer in her dad and myocardial infarction in her father she underwent hernia surgery in 2016 and reports persistent right leg numbness after this. She had an episode of tongue swelling a few days ago for which she saw her PCP  & Enablex was stopped   She did not desaturate on walking today his oxygen saturation stayed above 95% and heart rate increased from 94-117 Significant tests/ events reviewed  Chest x-ray 12/2015-mild linear scarring CT angiogram 08/2012 negative for  pulmonary emboli Nuclear stress test 01/2016 low risk, EF was 74%    Past Medical History:  Diagnosis Date  . Anxiety   . Depression   . Hypertension   . Kidney stones      Past Surgical History:  Procedure Laterality Date  . APPENDECTOMY    . CHOLECYSTECTOMY    . HERNIA REPAIR    . TONSILLECTOMY  05/1973   Allergies  Allergen Reactions  . Ciprofloxacin Rash  . Sulfur Rash  . Enablex [Darifenacin Hydrobromide Er] Swelling    Causes swelling of patient's tongue  . Latex   . Prednisolone Other (See Comments)    Convulsions per pt  . Sulfa Antibiotics   . Influenza Vaccines Rash  . Oxycodone-Acetaminophen Rash  . Pneumococcal Vaccine Rash    Social History   Social History  . Marital status: Married    Spouse name: N/A  . Number of children: N/A  . Years of education: N/A   Occupational History  . Not on file.   Social History Main Topics  . Smoking status: Never Smoker  . Smokeless tobacco: Never Used  . Alcohol use No  . Drug use: No  . Sexual activity: Not on file   Other Topics Concern  . Not on file   Social History Narrative  . No narrative on file     Family History  Problem Relation Age of Onset  . Cancer Mother   . Aneurysm Mother   . Cancer Father   .  Hypertension Father   . Alzheimer's disease Father      Review of Systems Constitutional: negative for anorexia, fevers and sweats  Eyes: negative for irritation, redness and visual disturbance  Ears, nose, mouth, throat, and face: negative for earaches, epistaxis, nasal congestion and sore throat  Respiratory: negative for cough,sputum and wheezing  Cardiovascular: negative for chest pain, dyspnea, lower extremity edema, orthopnea, palpitations and syncope  Gastrointestinal: negative for abdominal pain, constipation, diarrhea, melena, nausea and vomiting  Genitourinary:negative for dysuria, frequency and hematuria  Hematologic/lymphatic: negative for bleeding, easy bruising and  lymphadenopathy  Musculoskeletal:negative for arthralgias, muscle weakness and stiff joints  Neurological: negative for coordination problems, gait problems, headaches and weakness  Endocrine: negative for diabetic symptoms including polydipsia, polyuria and weight loss     Objective:   Physical Exam  Gen. Pleasant, obese, in no distress, normal affect ENT - no lesions, no post nasal drip, class 2-3 airway Neck: No JVD, no thyromegaly, no carotid bruits Lungs: no use of accessory muscles, no dullness to percussion, decreased without rales or rhonchi  Cardiovascular: Rhythm regular, heart sounds  normal, no murmurs or gallops, no peripheral edema Abdomen: soft and non-tender, no hepatosplenomegaly, BS normal. Musculoskeletal: No deformities, no cyanosis or clubbing Neuro:  alert, non focal, no tremors exam431       Assessment & Plan:

## 2016-06-20 DIAGNOSIS — G4733 Obstructive sleep apnea (adult) (pediatric): Secondary | ICD-10-CM | POA: Diagnosis not present

## 2016-06-21 LAB — BASIC METABOLIC PANEL
BUN / CREAT RATIO: 11 (ref 9–23)
BUN: 15 mg/dL (ref 6–24)
CO2: 25 mmol/L (ref 18–29)
Calcium: 10.1 mg/dL (ref 8.7–10.2)
Chloride: 103 mmol/L (ref 96–106)
Creatinine, Ser: 1.33 mg/dL — ABNORMAL HIGH (ref 0.57–1.00)
GFR calc Af Amer: 51 mL/min/{1.73_m2} — ABNORMAL LOW (ref >59)
GFR calc non Af Amer: 44 mL/min/{1.73_m2} — ABNORMAL LOW (ref >59)
GLUCOSE: 77 mg/dL (ref 65–99)
POTASSIUM: 4.5 mmol/L (ref 3.5–5.2)
SODIUM: 140 mmol/L (ref 134–144)

## 2016-06-21 LAB — D-DIMER, QUANTITATIVE (NOT AT ARMC)

## 2016-06-23 ENCOUNTER — Telehealth: Payer: Self-pay | Admitting: Pulmonary Disease

## 2016-06-23 ENCOUNTER — Encounter: Payer: Self-pay | Admitting: Pulmonary Disease

## 2016-06-23 NOTE — Telephone Encounter (Signed)
D dimer test not performed by lab - pl find out why & let pt know if willing to repeat

## 2016-06-23 NOTE — Telephone Encounter (Signed)
Pt requesting lab results from last week. RA please advise on results.  Thanks!

## 2016-06-24 ENCOUNTER — Encounter: Payer: Self-pay | Admitting: Pulmonary Disease

## 2016-06-24 ENCOUNTER — Other Ambulatory Visit: Payer: BLUE CROSS/BLUE SHIELD

## 2016-06-24 DIAGNOSIS — D126 Benign neoplasm of colon, unspecified: Secondary | ICD-10-CM

## 2016-06-24 NOTE — Telephone Encounter (Signed)
Spoke with, who states she will come have labs drawn tomorrow.   Pt also wanted to know if RA would test for allergies to medications. Pt states this was discussed with RA during her OV.   RA please advise. Thanks.

## 2016-06-24 NOTE — Telephone Encounter (Signed)
Pt returning call.Sherri Howard ° °

## 2016-06-24 NOTE — Telephone Encounter (Signed)
I called and spoke with Renee in the lab  She states that the D Dimer was cancelled by [facility], but she is unsure why  She will call and inquire  I called pt to see if she can come back and line was busy x 2 [facility]

## 2016-06-25 ENCOUNTER — Telehealth: Payer: Self-pay | Admitting: Pulmonary Disease

## 2016-06-25 DIAGNOSIS — R0600 Dyspnea, unspecified: Secondary | ICD-10-CM

## 2016-06-25 NOTE — Telephone Encounter (Signed)
None of her meds should cause dyspnea

## 2016-06-25 NOTE — Telephone Encounter (Signed)
Explained to patient that Dr. Elsworth Soho would not be back in the office until next Tuesday in order to sign a RX for her labs. Told patient that I can order it with [facility]. Order has been changed.

## 2016-06-25 NOTE — Telephone Encounter (Signed)
Pt wanting labs to be drawn at PCP office to save her from driving an hour to our office.  Pt's PCP is in [place], I advised that her PCP office's lab should have access to these orders and should be able to draw these labs for her.  I advised pt to have PCP office call our office if anything further is needed. Pt expressed understanding.  Nothing further needed.

## 2016-06-25 NOTE — Telephone Encounter (Signed)
Pt aware of rec's for Dr Elsworth Soho.  Pt still having labs drawn today.  Nothing further needed.

## 2016-06-28 ENCOUNTER — Encounter: Payer: Self-pay | Admitting: Pediatrics

## 2016-06-30 ENCOUNTER — Other Ambulatory Visit: Payer: BLUE CROSS/BLUE SHIELD

## 2016-06-30 ENCOUNTER — Other Ambulatory Visit: Payer: Self-pay | Admitting: Pediatrics

## 2016-06-30 DIAGNOSIS — R0600 Dyspnea, unspecified: Secondary | ICD-10-CM

## 2016-06-30 DIAGNOSIS — F411 Generalized anxiety disorder: Secondary | ICD-10-CM

## 2016-06-30 MED ORDER — BUSPIRONE HCL 5 MG PO TABS: 5.0000 mg | ORAL_TABLET | Freq: Two times a day (BID) | ORAL | 1 refills | Status: DC

## 2016-07-01 ENCOUNTER — Encounter: Payer: Self-pay | Admitting: Pulmonary Disease

## 2016-07-01 LAB — D-DIMER, QUANTITATIVE (NOT AT ARMC): D-DIMER: 0.72 mg{FEU}/L — AB (ref 0.00–0.49)

## 2016-07-01 NOTE — Telephone Encounter (Signed)
I have a question about D-DIMER,QUANTITATIVE resulted on 07/01/16, 5:40 AM.  I just wanted you to look at the blood results and let me know if there's anything that I thing to worry about.  RA pt stated that she has a question about the D-Dimer that was done and wanted to see if you could review this lab and let her know if there is anything that she needs to worry about.  Please advise. Thanks

## 2016-07-02 ENCOUNTER — Other Ambulatory Visit: Payer: Self-pay | Admitting: Physician Assistant

## 2016-07-02 ENCOUNTER — Encounter: Payer: Self-pay | Admitting: Pulmonary Disease

## 2016-07-02 ENCOUNTER — Telehealth: Payer: Self-pay | Admitting: Pulmonary Disease

## 2016-07-02 ENCOUNTER — Encounter: Payer: Self-pay | Admitting: Pediatrics

## 2016-07-02 ENCOUNTER — Other Ambulatory Visit: Payer: Self-pay

## 2016-07-02 DIAGNOSIS — R0600 Dyspnea, unspecified: Secondary | ICD-10-CM

## 2016-07-02 DIAGNOSIS — R0602 Shortness of breath: Secondary | ICD-10-CM

## 2016-07-02 MED ORDER — BUSPIRONE HCL 5 MG PO TABS: 10.0000 mg | ORAL_TABLET | Freq: Two times a day (BID) | ORAL | 1 refills | Status: DC

## 2016-07-02 NOTE — Telephone Encounter (Signed)
Patient is returning call, states she sees her CT is scheduled on MyChart and is aware to arrive 15 min before scheduled appt.  If need to call her back, CB is [phone].

## 2016-07-02 NOTE — Telephone Encounter (Signed)
Appointment has been rescheduled to 07/03/16 at 11:45am pt aware at [facility]

## 2016-07-02 NOTE — Telephone Encounter (Signed)
lmtcb x1 for pt.    Status:  Final result Visible to patient:  Yes (MyChart) Dx:  Dyspnea, unspecified type  Notes recorded by Randa Spike, CMA on 07/01/2016 at 4:31 PM EDT Spoke with pt. She is aware of her results. States that she would like to think about having the CT angio. Pt will call us by lunch time tomorrow to let us know her decision. ------  Notes recorded by Rigoberto Noel, MD on 07/01/2016 at 11:49 AM EDT D-dimer is slight high, so not helpful to exclude blood clots If her shortness of breath is persistent then would suggest CT angiogram chest to rule out PE

## 2016-07-02 NOTE — Telephone Encounter (Addendum)
CTA has been scheduled 07/14/16, due to being ordered as routine. I have spoken with pt who states she is having persistent sob. Pt also states she wears a cpap qhs and and this morning when awakening she had increased sob. Pt states she checked her O2 level at this time, O2 levels were between 84-87. CTA has been ordered as STAT. Pt states she will not be able to get scan next week, as she will be getting a colonoscopy on Friday and will have to prep all week.   Will route to RA to make aware.

## 2016-07-03 ENCOUNTER — Encounter (HOSPITAL_COMMUNITY): Payer: Self-pay

## 2016-07-03 ENCOUNTER — Telehealth: Payer: Self-pay | Admitting: Pulmonary Disease

## 2016-07-03 ENCOUNTER — Ambulatory Visit (HOSPITAL_COMMUNITY)
Admission: RE | Admit: 2016-07-03 | Discharge: 2016-07-03 | Disposition: A | Discharge status: Home / Self Care | Payer: BLUE CROSS/BLUE SHIELD | Source: Ambulatory Visit | Attending: Pulmonary Disease | Admitting: Pulmonary Disease

## 2016-07-03 DIAGNOSIS — J439 Emphysema, unspecified: Secondary | ICD-10-CM | POA: Insufficient documentation

## 2016-07-03 DIAGNOSIS — R7989 Other specified abnormal findings of blood chemistry: Secondary | ICD-10-CM | POA: Diagnosis not present

## 2016-07-03 DIAGNOSIS — R0602 Shortness of breath: Secondary | ICD-10-CM | POA: Diagnosis not present

## 2016-07-03 MED ORDER — IOPAMIDOL (ISOVUE-370) INJECTION 76%
60.0000 mL | Freq: Once | INTRAVENOUS | Status: AC | PRN
  Administered 2016-07-03: 60 mL via INTRAVENOUS

## 2016-07-03 NOTE — Telephone Encounter (Signed)
Spoke with patient. She is aware of results.   Nothing else needed at time of call.  

## 2016-07-03 NOTE — Telephone Encounter (Signed)
Pl let pt know - Let her know that although radiologist called mild emphysema  - I am not impressed with this & doubt that she has emphysema

## 2016-07-03 NOTE — Telephone Encounter (Signed)
Sherri Howard with [facility] called with call report of CT Angio Will send to Dr Elsworth Soho as Juluis Rainier  IMPRESSION: No evidence of pulmonary emboli. Mild emphysematous changes. Emphysema (ICD10-J43.9).

## 2016-07-04 ENCOUNTER — Ambulatory Visit: Payer: BLUE CROSS/BLUE SHIELD | Admitting: Pediatrics

## 2016-07-08 ENCOUNTER — Encounter: Payer: Self-pay | Admitting: Pulmonary Disease

## 2016-07-08 NOTE — Telephone Encounter (Signed)
Pt is requesting CT results from 07/03/16  RA please advise. Thanks.

## 2016-07-11 DIAGNOSIS — Z86010 Personal history of colonic polyps: Secondary | ICD-10-CM | POA: Diagnosis not present

## 2016-07-11 DIAGNOSIS — D123 Benign neoplasm of transverse colon: Secondary | ICD-10-CM | POA: Diagnosis not present

## 2016-07-14 ENCOUNTER — Ambulatory Visit (HOSPITAL_COMMUNITY): Payer: BLUE CROSS/BLUE SHIELD

## 2016-07-17 DIAGNOSIS — Z1211 Encounter for screening for malignant neoplasm of colon: Secondary | ICD-10-CM | POA: Insufficient documentation

## 2016-07-18 ENCOUNTER — Ambulatory Visit: Payer: BLUE CROSS/BLUE SHIELD | Admitting: Adult Health

## 2016-07-20 DIAGNOSIS — G4733 Obstructive sleep apnea (adult) (pediatric): Secondary | ICD-10-CM | POA: Diagnosis not present

## 2016-07-23 ENCOUNTER — Ambulatory Visit (INDEPENDENT_AMBULATORY_CARE_PROVIDER_SITE_OTHER): Payer: BLUE CROSS/BLUE SHIELD | Admitting: Pediatrics

## 2016-07-23 ENCOUNTER — Encounter: Payer: Self-pay | Admitting: Pediatrics

## 2016-07-23 VITALS — BP 118/74 | HR 85 | Temp 97.1°F | Resp 20 | Ht 65.0 in | Wt 272.8 lb

## 2016-07-23 DIAGNOSIS — R0609 Other forms of dyspnea: Secondary | ICD-10-CM

## 2016-07-23 DIAGNOSIS — F329 Major depressive disorder, single episode, unspecified: Secondary | ICD-10-CM

## 2016-07-23 DIAGNOSIS — I10 Essential (primary) hypertension: Secondary | ICD-10-CM

## 2016-07-23 DIAGNOSIS — F411 Generalized anxiety disorder: Secondary | ICD-10-CM | POA: Diagnosis not present

## 2016-07-23 DIAGNOSIS — F32A Depression, unspecified: Secondary | ICD-10-CM

## 2016-07-23 MED ORDER — HYOSCYAMINE SULFATE 0.125 MG PO TABS: 0.1250 mg | ORAL_TABLET | ORAL | 3 refills | Status: DC | PRN

## 2016-07-23 MED ORDER — ALPRAZOLAM 0.5 MG PO TABS: 0.5000 mg | ORAL_TABLET | Freq: Two times a day (BID) | ORAL | 1 refills | Status: DC | PRN

## 2016-07-23 NOTE — Progress Notes (Signed)
  Subjective:   [name], female    DOB: 1955/10/10, 61 y.o.   MRN: [id number] CC: Follow-up (4 week) anxiety, med problems HPI: SADAE Sherri Howard is a 61 y.o. female presenting for Follow-up (4 week)  Multiple bowel movements daily now, no constipation Manageable  Had colonoscopy, thinks she was told repeat in 5 years  Mood: has been better past few weeks Still has some down times Trying to avoid certain thoughts Has not been hitting herself, no plan for self harm Tried to refer to psychiatrist of pt's choice, they werent accepting outside pts, pt does not want to see anyone at this time  Anxiety: thinks buspar is helping Not taking xanax daily  Taking allergy pill daily  Seen by pulm for SOB No decrease in oxygen levels that visit with walking CTA without clots  No CP Thinks SOB slightly improved Taking Bp meds regularly  Relevant past medical, surgical, family and social history reviewed. Allergies and medications reviewed and updated. History  Smoking Status  . Never Smoker  Smokeless Tobacco  . Never Used   ROS: Per HPI   Objective:    BP 118/74   Pulse 85   Temp (!) 97.1 F (36.2 C) (Oral)   Resp 20   Ht 5\' 5"  (1.651 m)   Wt 272 lb 12.8 oz (123.7 kg)   SpO2 95%   BMI 45.40 kg/m   Wt Readings from Last 3 Encounters:  07/23/16 272 lb 12.8 oz (123.7 kg)  06/19/16 273 lb (123.8 kg)  06/17/16 273 lb 6.4 oz (124 kg)    Gen: NAD, alert, cooperative with exam, NCAT EYES: EOMI, no conjunctival injection, or no icterus ENT:  OP without erythema LYMPH: no cervical LAD CV: NRRR, normal S1/S2, no murmur Resp: CTABL, no wheezes, normal WOB Abd: +BS, soft, NTND.  Ext: No edema, warm Neuro: Alert and oriented MSK: normal muscle bulk Psych: well groomed, normal affect, no thoughts of SI  Assessment & Plan:  Sherri Howard was seen today for follow-up med probs  Diagnoses and all orders for this visit:  DOE (dyspnea on exertion) Seen by pulm, no PE,  pulm fibrosis Neg cardiac work up Cont to increase walking  Generalized anxiety disorder Improved with buspar, continue Using xanax as needed, a couple times a month Must be seen for refills -     ALPRAZolam (XANAX) 0.5 MG tablet; Take 1-2 tablets (0.5-1 mg total) by mouth 2 (two) times daily as needed. -     Basic Metabolic Panel  Depression, unspecified depression type Ongoing symptoms, no worsening, pt thinks somewhat improved, wants to wait to see psych, cont current meds  Essential hypertension Well controlled, cont current meds.  Follow up plan: Return in about 4 months (around 11/23/2016). Sherri Found, MD [place]

## 2016-07-24 LAB — BASIC METABOLIC PANEL
BUN/Creatinine Ratio: 15 (ref 9–23)
BUN: 17 mg/dL (ref 6–24)
CHLORIDE: 103 mmol/L (ref 96–106)
CO2: 20 mmol/L (ref 20–29)
CREATININE: 1.13 mg/dL — AB (ref 0.57–1.00)
Calcium: 9.9 mg/dL (ref 8.7–10.2)
GFR calc Af Amer: 62 mL/min/{1.73_m2} (ref >59)
GFR calc non Af Amer: 54 mL/min/{1.73_m2} — ABNORMAL LOW (ref >59)
GLUCOSE: 91 mg/dL (ref 65–99)
POTASSIUM: 4.3 mmol/L (ref 3.5–5.2)
SODIUM: 139 mmol/L (ref 134–144)

## 2016-08-11 ENCOUNTER — Encounter: Payer: BLUE CROSS/BLUE SHIELD | Admitting: *Deleted

## 2016-08-13 ENCOUNTER — Ambulatory Visit (INDEPENDENT_AMBULATORY_CARE_PROVIDER_SITE_OTHER): Payer: BLUE CROSS/BLUE SHIELD | Admitting: Pulmonary Disease

## 2016-08-13 ENCOUNTER — Encounter: Payer: Self-pay | Admitting: Adult Health

## 2016-08-13 ENCOUNTER — Ambulatory Visit (INDEPENDENT_AMBULATORY_CARE_PROVIDER_SITE_OTHER): Payer: BLUE CROSS/BLUE SHIELD | Admitting: Adult Health

## 2016-08-13 DIAGNOSIS — R0600 Dyspnea, unspecified: Secondary | ICD-10-CM | POA: Diagnosis not present

## 2016-08-13 DIAGNOSIS — E669 Obesity, unspecified: Secondary | ICD-10-CM | POA: Diagnosis not present

## 2016-08-13 LAB — PULMONARY FUNCTION TEST
DL/VA % pred: 103 %
DL/VA: 5.08 ml/min/mmHg/L
DLCO COR: 22.3 ml/min/mmHg
DLCO UNC % PRED: 92 %
DLCO UNC: 23.79 ml/min/mmHg
DLCO cor % pred: 87 %
FEF 25-75 PRE: 2.62 L/s
FEF 25-75 Post: 2.96 L/sec
FEF2575-%CHANGE-POST: 13 %
FEF2575-%PRED-PRE: 106 %
FEF2575-%Pred-Post: 120 %
FEV1-%Change-Post: 2 %
FEV1-%PRED-POST: 93 %
FEV1-%PRED-PRE: 91 %
FEV1-POST: 2.51 L
FEV1-Pre: 2.45 L
FEV1FVC-%Change-Post: 3 %
FEV1FVC-%Pred-Pre: 105 %
FEV6-%CHANGE-POST: -1 %
FEV6-%PRED-POST: 88 %
FEV6-%Pred-Pre: 89 %
FEV6-PRE: 2.98 L
FEV6-Post: 2.94 L
FEV6FVC-%PRED-POST: 103 %
FEV6FVC-%PRED-PRE: 103 %
FVC-%Change-Post: -1 %
FVC-%Pred-Post: 85 %
FVC-%Pred-Pre: 86 %
FVC-Post: 2.94 L
FVC-Pre: 2.98 L
POST FEV6/FVC RATIO: 100 %
Post FEV1/FVC ratio: 85 %
Pre FEV1/FVC ratio: 82 %
Pre FEV6/FVC Ratio: 100 %
RV % pred: 68 %
RV: 1.37 L
TLC % PRED: 72 %
TLC: 3.8 L

## 2016-08-13 NOTE — Progress Notes (Signed)
@Patient  ID: Sherri Howard, female    DOB: 1955/05/29, 61 y.o.   MRN: [id number]  Chief Complaint  Patient presents with  . Follow-up    Dyspnea    Referring provider: Eustaquio Maize, MD  HPI: 61 year old female never smoker seen for pulmonary consult 06/19/2016 for dyspnea She has obstructive sleep apnea on C Pap since 2000  TEST  Chest x-ray 12/2015-mild linear scarring CT angiogram 08/2012 negative for pulmonary emboli Nuclear stress test 01/2016 low risk, EF was 74%  08/13/2016 Follow up :  Dyspnea  Pt returns for 2 month follow up . She was seen last ov for pulmonary consult for dyspnea that for last couple of years. No wheezing .  Last ov with no ambulatory desats.  Says she has not been as active over last 7 yrs. She lost 80lb but has gained it all back . Wt is up at 273 lbs today . She does not exercise . She does light housework. And is independent in her self care /shopping/driving.  Has some back problems that limits her exercise.   Pulmonary function test done today showed normal lung function with no airflow obstruction or restriction. FEV1 93%, ratio 85, FVC 85%, no significant bronchodilator response. DLCO 92% CT chest showed mild emphysematous changes. Lungs were well aerated with no tumors noted. Negative for PE. Screening hemoglobin today was 15.8  Seen by cardiology , echo 09/2015 with EF 60%, gr 1 DD . Stress test reported as normal. Unable to find final results.    Allergies  Allergen Reactions  . Ciprofloxacin Rash  . Sulfur Rash  . Enablex [Darifenacin Hydrobromide Er] Swelling    Causes swelling of patient's tongue  . Latex   . Prednisolone Other (See Comments)    Convulsions per pt  . Sulfa Antibiotics   . Influenza Vaccines Rash  . Oxycodone-Acetaminophen Rash  . Pneumococcal Vaccine Rash    Immunization History  Administered Date(s) Administered  . Tdap 05/24/2012    Past Medical History:  Diagnosis Date  . Anxiety   . Depression     . Hypertension   . Kidney stones     Tobacco History: History  Smoking Status  . Never Smoker  Smokeless Tobacco  . Never Used   Counseling given: Not Answered   Outpatient Encounter Prescriptions as of 08/13/2016  Medication Sig  . acetaminophen (TYLENOL) 325 MG tablet Take 650 mg by mouth every 6 (six) hours as needed.  . ALPRAZolam (XANAX) 0.5 MG tablet Take 1-2 tablets (0.5-1 mg total) by mouth 2 (two) times daily as needed.  Marland Kitchen buPROPion (WELLBUTRIN SR) 150 MG 12 hr tablet Take 1 tablet (150 mg total) by mouth 2 (two) times daily.  . busPIRone (BUSPAR) 5 MG tablet Take 2 tablets (10 mg total) by mouth 2 (two) times daily.  Marland Kitchen CARTIA XT 240 MG 24 hr capsule TAKE 1 CAPSULE DAILY  . Cholecalciferol (VITAMIN D3) 5000 units TABS Take 1 tablet by mouth daily.   Marland Kitchen HYDROcodone-acetaminophen (NORCO/VICODIN) 5-325 MG tablet Take 1 tablet by mouth daily as needed for moderate pain.  . hyoscyamine (LEVSIN, ANASPAZ) 0.125 MG tablet Take 1 tablet (0.125 mg total) by mouth as needed.  Marland Kitchen losartan (COZAAR) 100 MG tablet Take 1 tablet (100 mg total) by mouth daily.  . Multiple Vitamins-Minerals (HAIR SKIN AND NAILS FORMULA PO) Take 3 tablets by mouth daily.   Marland Kitchen VIIBRYD 40 MG TABS TAKE 1 TABLET DAILY   No facility-administered encounter medications on file as  of 08/13/2016.      Review of Systems  Constitutional:   No  weight loss, night sweats,  Fevers, chills,  +fatigue, or  lassitude.  HEENT:   No headaches,  Difficulty swallowing,  Tooth/dental problems, or  Sore throat,                No sneezing, itching, ear ache, nasal congestion, post nasal drip,   CV:  No chest pain,  Orthopnea, PND, swelling in lower extremities, anasarca, dizziness, palpitations, syncope.   GI  No heartburn, indigestion, abdominal pain, nausea, vomiting, diarrhea, change in bowel habits, loss of appetite, bloody stools.   Resp:    No excess mucus, no productive cough,  No non-productive cough,  No coughing up of  blood.  No change in color of mucus.  No wheezing.  No chest wall deformity  Skin: no rash or lesions.  GU: no dysuria, change in color of urine, no urgency or frequency.  No flank pain, no hematuria   MS:  No joint pain or swelling.  No decreased range of motion.  +chronci  back pain.    Physical Exam  BP 108/70 (BP Location: Left Arm, Cuff Size: Large)   Pulse 82   Ht 5\' 5"  (1.651 m)   Wt 273 lb (123.8 kg)   SpO2 96%   BMI 45.43 kg/m   GEN: A/Ox3; pleasant , NAD, obese    HEENT:  [place]/AT,  EACs-clear, TMs-wnl, NOSE-clear, THROAT-clear, no lesions, no postnasal drip or exudate noted.   NECK:  Supple w/ fair ROM; no JVD; normal carotid impulses w/o bruits; no thyromegaly or nodules palpated; no lymphadenopathy.    RESP  Clear  P & A; w/o, wheezes/ rales/ or rhonchi. no accessory muscle use, no dullness to percussion  CARD:  RRR, no m/r/g, no peripheral edema, pulses intact, no cyanosis or clubbing.  GI:   Soft & nt; nml bowel sounds; no organomegaly or masses detected.   Musco: Warm bil, no deformities or joint swelling noted.   Neuro: alert, no focal deficits noted.    Skin: Warm, no lesions or rashes    Lab Results:  CBC    Component Value Date/Time   WBC 10.1 10/16/2015 1638   WBC 14.4 (H) 08/22/2014 2336   RBC 4.76 10/16/2015 1638   RBC 4.73 08/22/2014 2336   HGB 13.7 10/16/2015 1638   HCT 40.5 10/16/2015 1638   PLT 269 10/16/2015 1638   MCV 85 10/16/2015 1638   MCH 28.8 10/16/2015 1638   MCH 29.4 08/22/2014 2336   MCHC 33.8 10/16/2015 1638   MCHC 33.2 08/22/2014 2336   RDW 14.0 10/16/2015 1638   LYMPHSABS 1.3 10/16/2015 1638   MONOABS 1.0 08/22/2014 2336   EOSABS 0.0 10/16/2015 1638   BASOSABS 0.0 10/16/2015 1638    BMET    Component Value Date/Time   NA 139 07/23/2016 1527   K 4.3 07/23/2016 1527   CL 103 07/23/2016 1527   CO2 20 07/23/2016 1527   GLUCOSE 91 07/23/2016 1527   GLUCOSE 128 (H) 08/22/2014 2336   BUN 17 07/23/2016 1527    CREATININE 1.13 (H) 07/23/2016 1527   CALCIUM 9.9 07/23/2016 1527   GFRNONAA 54 (L) 07/23/2016 1527   GFRAA 62 07/23/2016 1527    BNP No results found for: BNP  ProBNP No results found for: PROBNP  Imaging: No results found.   Assessment & Plan:   Dyspnea Dyspnea for last 1-2 year ?etiology  Workup has been unrevealing with  neg card workup with echo and cardiac stress test.  PFT is normal  CT chest is neg for PE or acute process.  Suspect deconditioning and obesity contribute to her problems.Doubt this is asthma as no sign BD /reversibility on PFT . No sign cough or wheezing .  If sx persists could consider CPST.  follow up As needed   encouarged on wt loss and diet .      Rexene Edison, NP 08/13/2016

## 2016-08-13 NOTE — Progress Notes (Signed)
PFT done today. 

## 2016-08-13 NOTE — Patient Instructions (Signed)
Continue on current regimen.  Follow up with Dr. Elsworth Soho As needed

## 2016-08-13 NOTE — Assessment & Plan Note (Signed)
Wt loss  

## 2016-08-13 NOTE — Assessment & Plan Note (Signed)
Dyspnea for last 1-2 year ?etiology  Workup has been unrevealing with neg card workup with echo and cardiac stress test.  PFT is normal  CT chest is neg for PE or acute process.  Suspect deconditioning and obesity contribute to her problems.Doubt this is asthma as no sign BD /reversibility on PFT . No sign cough or wheezing .  If sx persists could consider CPST.  follow up As needed   encouarged on wt loss and diet .

## 2016-08-18 ENCOUNTER — Other Ambulatory Visit: Payer: Self-pay | Admitting: Pediatrics

## 2016-08-18 DIAGNOSIS — F32A Depression, unspecified: Secondary | ICD-10-CM

## 2016-08-18 DIAGNOSIS — F329 Major depressive disorder, single episode, unspecified: Secondary | ICD-10-CM

## 2016-08-31 ENCOUNTER — Encounter: Payer: Self-pay | Admitting: Pediatrics

## 2016-09-01 MED ORDER — BUSPIRONE HCL 10 MG PO TABS: 10.0000 mg | ORAL_TABLET | Freq: Two times a day (BID) | ORAL | 1 refills | Status: DC

## 2016-09-16 ENCOUNTER — Other Ambulatory Visit: Payer: Self-pay | Admitting: Pediatrics

## 2016-09-16 DIAGNOSIS — F411 Generalized anxiety disorder: Secondary | ICD-10-CM

## 2016-09-18 DIAGNOSIS — G4733 Obstructive sleep apnea (adult) (pediatric): Secondary | ICD-10-CM | POA: Diagnosis not present

## 2016-09-18 NOTE — Telephone Encounter (Signed)
Must be seen for refills, pt aware

## 2016-09-23 ENCOUNTER — Other Ambulatory Visit: Payer: Self-pay | Admitting: Pediatrics

## 2016-09-24 ENCOUNTER — Other Ambulatory Visit: Payer: Self-pay | Admitting: Pediatrics

## 2016-09-24 DIAGNOSIS — I10 Essential (primary) hypertension: Secondary | ICD-10-CM

## 2016-09-26 ENCOUNTER — Other Ambulatory Visit: Payer: Self-pay

## 2016-09-29 MED ORDER — LOSARTAN POTASSIUM 50 MG PO TABS: 50.0000 mg | ORAL_TABLET | Freq: Every day | ORAL | 0 refills | Status: DC

## 2016-09-29 NOTE — Addendum Note (Signed)
Addended by: Antonietta Barcelona D on: 09/29/2016 09:55 AM   Modules accepted: Orders

## 2016-10-08 ENCOUNTER — Encounter: Payer: Self-pay | Admitting: Pediatrics

## 2016-10-09 ENCOUNTER — Encounter (HOSPITAL_COMMUNITY): Payer: Self-pay | Admitting: Emergency Medicine

## 2016-10-09 ENCOUNTER — Encounter: Payer: Self-pay | Admitting: Family

## 2016-10-09 ENCOUNTER — Ambulatory Visit (INDEPENDENT_AMBULATORY_CARE_PROVIDER_SITE_OTHER): Payer: BLUE CROSS/BLUE SHIELD | Admitting: Family

## 2016-10-09 ENCOUNTER — Emergency Department (HOSPITAL_COMMUNITY)
Admission: EM | Admit: 2016-10-09 | Discharge: 2016-10-09 | Disposition: A | Discharge status: Home / Self Care | Payer: BLUE CROSS/BLUE SHIELD | Attending: Emergency Medicine | Admitting: Emergency Medicine

## 2016-10-09 ENCOUNTER — Emergency Department (HOSPITAL_COMMUNITY): Payer: BLUE CROSS/BLUE SHIELD

## 2016-10-09 VITALS — BP 139/83 | HR 101 | Ht 65.0 in | Wt 274.0 lb

## 2016-10-09 DIAGNOSIS — M6281 Muscle weakness (generalized): Secondary | ICD-10-CM | POA: Insufficient documentation

## 2016-10-09 DIAGNOSIS — H539 Unspecified visual disturbance: Secondary | ICD-10-CM | POA: Diagnosis not present

## 2016-10-09 DIAGNOSIS — R51 Headache: Secondary | ICD-10-CM | POA: Diagnosis not present

## 2016-10-09 DIAGNOSIS — Z79899 Other long term (current) drug therapy: Secondary | ICD-10-CM | POA: Diagnosis not present

## 2016-10-09 DIAGNOSIS — R531 Weakness: Secondary | ICD-10-CM | POA: Diagnosis not present

## 2016-10-09 DIAGNOSIS — R42 Dizziness and giddiness: Secondary | ICD-10-CM

## 2016-10-09 DIAGNOSIS — Z91040 Latex allergy status: Secondary | ICD-10-CM | POA: Insufficient documentation

## 2016-10-09 DIAGNOSIS — R2689 Other abnormalities of gait and mobility: Secondary | ICD-10-CM

## 2016-10-09 DIAGNOSIS — H9319 Tinnitus, unspecified ear: Secondary | ICD-10-CM

## 2016-10-09 DIAGNOSIS — I10 Essential (primary) hypertension: Secondary | ICD-10-CM | POA: Diagnosis not present

## 2016-10-09 DIAGNOSIS — R519 Headache, unspecified: Secondary | ICD-10-CM

## 2016-10-09 LAB — CBC WITH DIFFERENTIAL/PLATELET
Basophils Absolute: 0.1 10*3/uL (ref 0.0–0.1)
Basophils Relative: 1 %
EOS ABS: 0.2 10*3/uL (ref 0.0–0.7)
Eosinophils Relative: 4 %
HCT: 40 % (ref 36.0–46.0)
HEMOGLOBIN: 13.2 g/dL (ref 12.0–15.0)
LYMPHS ABS: 1.9 10*3/uL (ref 0.7–4.0)
Lymphocytes Relative: 31 %
MCH: 29.5 pg (ref 26.0–34.0)
MCHC: 33 g/dL (ref 30.0–36.0)
MCV: 89.5 fL (ref 78.0–100.0)
MONO ABS: 0.6 10*3/uL (ref 0.1–1.0)
MONOS PCT: 10 %
Neutro Abs: 3.5 10*3/uL (ref 1.7–7.7)
Neutrophils Relative %: 56 %
Platelets: 230 10*3/uL (ref 150–400)
RBC: 4.47 MIL/uL (ref 3.87–5.11)
RDW: 13.2 % (ref 11.5–15.5)
WBC: 6.3 10*3/uL (ref 4.0–10.5)

## 2016-10-09 LAB — I-STAT CHEM 8, ED
BUN: 17 mg/dL (ref 6–20)
CHLORIDE: 107 mmol/L (ref 101–111)
Calcium, Ion: 1.15 mmol/L (ref 1.15–1.40)
Creatinine, Ser: 1 mg/dL (ref 0.44–1.00)
Glucose, Bld: 96 mg/dL (ref 65–99)
HEMATOCRIT: 39 % (ref 36.0–46.0)
Hemoglobin: 13.3 g/dL (ref 12.0–15.0)
POTASSIUM: 4 mmol/L (ref 3.5–5.1)
SODIUM: 140 mmol/L (ref 135–145)
TCO2: 24 mmol/L (ref 22–32)

## 2016-10-09 LAB — I-STAT TROPONIN, ED: TROPONIN I, POC: 0 ng/mL (ref 0.00–0.08)

## 2016-10-09 NOTE — ED Triage Notes (Signed)
Patient complains of dizziness and x 3 days. Denies visual changes, numbness tingling. Denies weakness. Denies n/v/d.

## 2016-10-09 NOTE — Patient Instructions (Signed)
Dizziness Dizziness is a common problem. It is a feeling of unsteadiness or light-headedness. You may feel like you are about to faint. Dizziness can lead to injury if you stumble or fall. Anyone can become dizzy, but dizziness is more common in older adults. This condition can be caused by a number of things, including medicines, dehydration, or illness. Follow these instructions at home: Taking these steps may help with your condition: Eating and drinking   Drink enough fluid to keep your urine clear or pale yellow. This helps to keep you from becoming dehydrated. Try to drink more clear fluids, such as water.  Do not drink alcohol.  Limit your caffeine intake if directed by your health care provider.  Limit your salt intake if directed by your health care provider. Activity   Avoid making quick movements.  Rise slowly from chairs and steady yourself until you feel okay.  In the morning, first sit up on the side of the bed. When you feel okay, stand slowly while you hold onto something until you know that your balance is fine.  Move your legs often if you need to stand in one place for a long time. Tighten and relax your muscles in your legs while you are standing.  Do not drive or operate heavy machinery if you feel dizzy.  Avoid bending down if you feel dizzy. Place items in your home so that they are easy for you to reach without leaning over. Lifestyle   Do not use any tobacco products, including cigarettes, chewing tobacco, or electronic cigarettes. If you need help quitting, ask your health care provider.  Try to reduce your stress level, such as with yoga or meditation. Talk with your health care provider if you need help. General instructions   Watch your dizziness for any changes.  Take medicines only as directed by your health care provider. Talk with your health care provider if you think that your dizziness is caused by a medicine that you are taking.  Tell a friend  or a family member that you are feeling dizzy. If he or she notices any changes in your behavior, have this person call your health care provider.  Keep all follow-up visits as directed by your health care provider. This is important. Contact a health care provider if:  Your dizziness does not go away.  Your dizziness or light-headedness gets worse.  You feel nauseous.  You have reduced hearing.  You have new symptoms.  You are unsteady on your feet or you feel like the room is spinning. Get help right away if:  You vomit or have diarrhea and are unable to eat or drink anything.  You have problems talking, walking, swallowing, or using your arms, hands, or legs.  You feel generally weak.  You are not thinking clearly or you have trouble forming sentences. It may take a friend or family member to notice this.  You have chest pain, abdominal pain, shortness of breath, or sweating.  Your vision changes.  You notice any bleeding.  You have a headache.  You have neck pain or a stiff neck.  You have a fever. This information is not intended to replace advice given to you by your health care provider. Make sure you discuss any questions you have with your health care provider. Document Released: 06/25/2000 Document Revised: 06/07/2015 Document Reviewed: 12/26/2013 Elsevier Interactive Patient Education  2017 Elsevier Inc.  

## 2016-10-09 NOTE — ED Provider Notes (Signed)
[facility] DEPT Provider Note   CSN: [id number] Arrival date & time: 10/09/16  1839     History   Chief Complaint Chief Complaint  Patient presents with  . Dizziness    HPI Sherri Howard is a 61 y.o. female.Place of generalized weakness and feeling off balance for 1 week accompanied by intermittent headaches and feeling of "I heart beating behind my eyes" she denies visual changes denies fever. Denies nausea or vomiting. Sent headaches sometimes worse with moving her head. She is presently pain-free. No treatment prior to coming here nothing makes symptoms better.she feels as if her blood pressure is lowno chest pain no shortness of breath no nausea or vomiting no other associated symptoms  HPI  Past Medical History:  Diagnosis Date  . Anxiety   . Depression   . Hypertension   . Kidney stones     Patient Active Problem List   Diagnosis Date Noted  . Mild obesity 08/13/2016  . Encounter for screening colonoscopy 07/17/2016  . Depression, recurrent ([place]) 02/28/2016  . Pyelonephritis 11/09/2015  . Right ureteral calculus 11/07/2015  . Encounter for opiate analgesic use agreement 09/20/2015  . Pain medication agreement signed 09/20/2015  . Low back pain 08/24/2015  . Fatigue 08/09/2015  . Palpitations 06/26/2015  . Hydronephrosis 06/18/2015  . Abdominal pain 03/02/2015  . Dyspnea 03/02/2015  . Benign essential hypertension 12/20/2014  . Chronic recurrent major depressive disorder ([place]) 12/20/2014  . Dyslipidemia 12/20/2014  . Generalized anxiety disorder 12/20/2014  . Irritable bowel syndrome 12/20/2014  . Morbid obesity with BMI of 40.0-44.9, adult ([place]) 12/20/2014  . OSA on CPAP 12/20/2014  . Lactose intolerance 12/20/2014  . Insomnia 12/20/2014  . Vitamin D deficiency 12/20/2014  . Urinary incontinence 12/20/2014  . Other specified postprocedural states 07/31/2014  . S/P herniorrhaphy 07/31/2014  . Ventral incisional hernia 06/16/2014  . History of  colectomy 09/07/2012  . Adenomatous colon polyp 07/21/2012  . History of renal calculi 07/21/2012    Past Surgical History:  Procedure Laterality Date  . APPENDECTOMY    . CHOLECYSTECTOMY    . HERNIA REPAIR    . TONSILLECTOMY  05/1973    OB History    No data available       Home Medications    Prior to Admission medications   Medication Sig Start Date End Date Taking? Authorizing Provider  acetaminophen (TYLENOL) 325 MG tablet Take 650 mg by mouth every 6 (six) hours as needed.    [provider]  ALPRAZolam Duanne Moron) 0.5 MG tablet Take 1-2 tablets (0.5-1 mg total) by mouth 2 (two) times daily as needed. 07/23/16   Eustaquio Maize, MD  buPROPion [facility] SR) 150 MG 12 hr tablet TAKE 1 TABLET TWICE A DAY 08/19/16   Eustaquio Maize, MD  busPIRone (BUSPAR) 10 MG tablet Take 1 tablet (10 mg total) by mouth 2 (two) times daily. 09/01/16   Eustaquio Maize, MD  CARTIA XT 240 MG 24 hr capsule TAKE 1 CAPSULE DAILY 07/03/16   Eustaquio Maize, MD  Cholecalciferol (VITAMIN D3) 5000 units TABS Take 1 tablet by mouth daily.     [provider]  HYDROcodone-acetaminophen (NORCO/VICODIN) 5-325 MG tablet Take 1 tablet by mouth daily as needed for moderate pain. Patient not taking: Reported on 10/09/2016 03/03/16   Eustaquio Maize, MD  hyoscyamine (LEVSIN, ANASPAZ) 0.125 MG tablet Take 1 tablet (0.125 mg total) by mouth as needed. 07/23/16   Eustaquio Maize, MD  losartan (COZAAR) 100 MG  tablet Take 1 tablet (100 mg total) by mouth daily. 06/05/16   Eustaquio Maize, MD  Multiple Vitamins-Minerals (HAIR SKIN AND NAILS FORMULA PO) Take 3 tablets by mouth daily.     [provider]  VIIBRYD 40 MG TABS TAKE 1 TABLET DAILY 09/24/16   Eustaquio Maize, MD    Family History Family History  Problem Relation Age of Onset  . Cancer Mother   . Aneurysm Mother   . Cancer Father   . Hypertension Father   . Alzheimer's disease Father     Social History Social History    Substance Use Topics  . Smoking status: Never Smoker  . Smokeless tobacco: Never Used  . Alcohol use No     Allergies   Ciprofloxacin; Sulfur; Enablex [darifenacin hydrobromide er]; Latex; Prednisolone; Sulfa antibiotics; Influenza vaccines; Oxycodone-acetaminophen; and Pneumococcal vaccine   Review of Systems Review of Systems  HENT: Negative.   Respiratory: Negative.   Cardiovascular: Negative.   Gastrointestinal: Negative.   Musculoskeletal: Negative.   Skin: Negative.   Neurological: Positive for dizziness, weakness and headaches.  Psychiatric/Behavioral: Negative.   All other systems reviewed and are negative.    Physical Exam Updated Vital Signs BP 139/80   Pulse 84   Temp 98.4 F (36.9 C) (Oral)   Resp 15   SpO2 96%   Physical Exam  Constitutional: She is oriented to person, place, and time. She appears well-developed and well-nourished. No distress.  HENT:  Head: Normocephalic and atraumatic.  Eyes: Pupils are equal, round, and reactive to light. Conjunctivae are normal.  Fundi benign  Neck: Neck supple. No tracheal deviation present. No thyromegaly present.  Cardiovascular: Normal rate and regular rhythm.   No murmur heard. Pulmonary/Chest: Effort normal and breath sounds normal.  Abdominal: Soft. Bowel sounds are normal. She exhibits no distension. There is no tenderness.  Musculoskeletal: Normal range of motion. She exhibits no edema or tenderness.  Neurological: She is alert and oriented to person, place, and time. She displays normal reflexes. No cranial nerve deficit. She exhibits normal muscle tone. Coordination normal.  Gait normal Romberg normal pronator drift normal finger to nose normal DTR symmetric bilaterally at knee jerk ankle jerk biceps toes or going bilaterally. Cranial nerves II through XII grossly intact  Skin: Skin is warm and dry. No rash noted.  Psychiatric: She has a normal mood and affect.  Nursing note and vitals  reviewed.    ED Treatments / Results  Labs (all labs ordered are listed, but only abnormal results are displayed) Labs Reviewed  CBC WITH DIFFERENTIAL/PLATELET  I-STAT CHEM 8, ED    EKG  EKG Interpretation  Date/Time:  Thursday October 09 2016 19:02:10 EDT Ventricular Rate:  88 PR Interval:    QRS Duration: 98 QT Interval:  375 QTC Calculation: 128 R Axis:   30 Text Interpretation:  Sinus rhythm Prolonged PR interval Baseline wander in lead(s) V2 No significant change since last tracing Confirmed by Orlie Dakin [phone]) on 10/09/2016 7:42:23 PM      Results for orders placed or performed during the hospital encounter of 10/09/16  CBC with Differential/Platelet  Result Value Ref Range   WBC 6.3 4.0 - 10.5 K/uL   RBC 4.47 3.87 - 5.11 MIL/uL   Hemoglobin 13.2 12.0 - 15.0 g/dL   HCT 40.0 36.0 - 46.0 %   MCV 89.5 78.0 - 100.0 fL   MCH 29.5 26.0 - 34.0 pg   MCHC 33.0 30.0 - 36.0 g/dL   RDW 13.2  11.5 - 15.5 %   Platelets 230 150 - 400 K/uL   Neutrophils Relative % 56 %   Neutro Abs 3.5 1.7 - 7.7 K/uL   Lymphocytes Relative 31 %   Lymphs Abs 1.9 0.7 - 4.0 K/uL   Monocytes Relative 10 %   Monocytes Absolute 0.6 0.1 - 1.0 K/uL   Eosinophils Relative 4 %   Eosinophils Absolute 0.2 0.0 - 0.7 K/uL   Basophils Relative 1 %   Basophils Absolute 0.1 0.0 - 0.1 K/uL  I-stat chem 8, ed  Result Value Ref Range   Sodium 140 135 - 145 mmol/L   Potassium 4.0 3.5 - 5.1 mmol/L   Chloride 107 101 - 111 mmol/L   BUN 17 6 - 20 mg/dL   Creatinine, Ser 1.00 0.44 - 1.00 mg/dL   Glucose, Bld 96 65 - 99 mg/dL   Calcium, Ion 1.15 1.15 - 1.40 mmol/L   TCO2 24 22 - 32 mmol/L   Hemoglobin 13.3 12.0 - 15.0 g/dL   HCT 39.0 36.0 - 46.0 %  I-stat troponin, ED  Result Value Ref Range   Troponin i, poc 0.00 0.00 - 0.08 ng/mL   Comment 3           Ct Head Wo Contrast  Result Date: 10/09/2016 CLINICAL DATA:  Dizziness 3 days. EXAM: CT HEAD WITHOUT CONTRAST TECHNIQUE: Contiguous axial images  were obtained from the base of the skull through the vertex without intravenous contrast. COMPARISON:  None. FINDINGS: Brain: Ventricles, cisterns and other CSF spaces are within normal. There is no mass, mass effect, shift of midline structures or acute hemorrhage. No evidence of acute infarction. Vascular: No hyperdense vessel or unexpected calcification. Skull: Normal. Negative for fracture or focal lesion. Sinuses/Orbits: No acute finding. Other: None. IMPRESSION: No acute intracranial findings. Electronically Signed   By: Marin Olp M.D.   On: 10/09/2016 20:37    Radiology No results found.  Procedures Procedures (including critical care time)  Medications Ordered in ED Medications - No data to display   Initial Impression / Assessment and Plan / ED Course  I have reviewed the triage vital signs and the nursing notes.  Pertinent labs & imaging results that were available during my care of the patient were reviewed by me and considered in my medical decision making (see chart for details).   9:45 PM patient feels improved.alert and appears in no distress. Plan home observation. Follow-up with PMD if not continuing to feel better by next week   Final Clinical Impressions(s) / ED Diagnoses  Dx #1 generalized weakness #2 nonspecific headache Final diagnoses:  None    New Prescriptions New Prescriptions   No medications on file     Orlie Dakin, MD 10/09/16 2155

## 2016-10-09 NOTE — Discharge Instructions (Signed)
Call Dr. Evette Doffing to schedule an office appointment if you do not continue to feel better by next week. Return if concern for any reason

## 2016-10-09 NOTE — Progress Notes (Signed)
   Subjective:    [name], female    DOB: 09-03-1955, 61 y.o.   MRN: [id number]  Pt presents to the office today with intermittent headache, dizziness, loss of balance, a feeling of not being able to wake  Up, and tinnitus that started over the last few weeks that has become worse.  Dizziness  Associated symptoms include headaches. Pertinent negatives include no nausea or vomiting.  Headache   This is a new problem. The current episode started in the past 7 days. The problem occurs intermittently. The problem has been waxing and waning. Pain location: generalized. Radiates to: neck. The pain quality is not similar to prior headaches. The quality of the pain is described as pulsating. The pain is at a severity of 3/10. The pain is moderate. Associated symptoms include blurred vision, dizziness, a loss of balance, phonophobia, photophobia and tinnitus. Pertinent negatives include no nausea, sinus pressure or vomiting. Nothing aggravates the symptoms. She has tried acetaminophen for the symptoms. The treatment provided mild relief. Her past medical history is significant for hypertension and obesity. There is no history of migraine headaches, migraines in the family or recent head traumas.      Review of Systems  HENT: Positive for tinnitus. Negative for sinus pressure.   Eyes: Positive for blurred vision and photophobia.  Gastrointestinal: Negative for nausea and vomiting.  Neurological: Positive for dizziness, headaches and loss of balance.  All other systems reviewed and are negative.      Objective:   Physical Exam  Constitutional: She is oriented to person, place, and time. She appears well-developed and well-nourished. No distress.  HENT:  Head: Normocephalic.  Eyes: Pupils are equal, round, and reactive to light.  Neck: Normal range of motion. Neck supple. No thyromegaly present.  Cardiovascular: Normal rate, regular rhythm, normal heart sounds and intact distal  pulses.   No murmur heard. Pulmonary/Chest: Effort normal and breath sounds normal. No respiratory distress. She has no wheezes.  Abdominal: Soft. Bowel sounds are normal. She exhibits no distension. There is no tenderness.  Musculoskeletal: Normal range of motion. She exhibits no edema or tenderness.  Neurological: She is alert and oriented to person, place, and time.  Skin: Skin is warm and dry.  Psychiatric: She has a normal mood and affect. Her behavior is normal. Judgment and thought content normal.  Vitals reviewed.     BP 139/83   Pulse (!) 101   Ht 5\' 5"  (1.651 m)   Wt 274 lb (124.3 kg)   BMI 45.60 kg/m      Assessment & Plan:  1. Acute nonintractable headache, unspecified headache type  2. Dizziness  3. Loss of balance  4. Vision changes  5. Tinnitus, unspecified laterality   PT told to go to ED- Husband wants to drive pt Pt states she has never had migraines or headaches before Complaining of loss of balance, vision changes, and dizziness Falls precautions discussed  Evelina Dun, FNP

## 2016-10-16 ENCOUNTER — Telehealth: Payer: Self-pay | Admitting: Pulmonary Disease

## 2016-10-16 NOTE — Telephone Encounter (Signed)
Spoke with pt, she states [employer] can call her on Monday on her home phone or cell phone. Left message to call back so we can let Belenda Cruise know.

## 2016-10-17 NOTE — Telephone Encounter (Signed)
I have left a message for [employer] to call us back to give the contact info for the pt.

## 2016-10-17 NOTE — Telephone Encounter (Signed)
Belenda Cruise called back and she is aware of the numbers for the pt to contact her on Monday.  Nothing further is needed.

## 2016-10-23 ENCOUNTER — Other Ambulatory Visit: Payer: Self-pay | Admitting: Pediatrics

## 2016-10-23 DIAGNOSIS — F411 Generalized anxiety disorder: Secondary | ICD-10-CM

## 2016-10-24 ENCOUNTER — Encounter: Payer: Self-pay | Admitting: Pediatrics

## 2016-10-24 ENCOUNTER — Telehealth: Payer: Self-pay | Admitting: Pediatrics

## 2016-10-24 NOTE — Telephone Encounter (Signed)
Patient requesting Xanax refill.  Please advise.

## 2016-10-24 NOTE — Telephone Encounter (Signed)
Needs to be seen

## 2016-10-24 NOTE — Telephone Encounter (Signed)
Aware. 

## 2016-10-25 ENCOUNTER — Telehealth: Payer: Self-pay | Admitting: Pediatrics

## 2016-10-25 ENCOUNTER — Encounter: Payer: Self-pay | Admitting: Pediatrics

## 2016-10-25 NOTE — Telephone Encounter (Signed)
Notified patient that she will have to wait until appt with PCP for refill

## 2016-10-25 NOTE — Telephone Encounter (Signed)
Old note states she needed to be seen, was by Dr. Evette Doffing on 10/24/16.

## 2016-10-25 NOTE — Telephone Encounter (Signed)
Requesting refill on Xanax. Has appt with PCP on Monday but she is out now. Script given for #60 on 7/11.  Would we be able to give her a partial script to last until her appt on Monday?

## 2016-10-25 NOTE — Telephone Encounter (Signed)
What is the name of the medication? xanax  Have you contacted your pharmacy to request a refill? yes  Which pharmacy would you like this sent to? Drug store in [place], pt is out has appt on Monday, wants to know if she can still get refill   Patient notified that their request is being sent to the clinical staff for review and that they should receive a call once it is complete. If they do not receive a call within 24 hours they can check with their pharmacy or our office.

## 2016-10-27 ENCOUNTER — Encounter: Payer: Self-pay | Admitting: Pediatrics

## 2016-10-27 ENCOUNTER — Ambulatory Visit (INDEPENDENT_AMBULATORY_CARE_PROVIDER_SITE_OTHER): Payer: BLUE CROSS/BLUE SHIELD | Admitting: Pediatrics

## 2016-10-27 VITALS — BP 126/68 | HR 83 | Temp 98.1°F | Ht 65.0 in | Wt 273.6 lb

## 2016-10-27 DIAGNOSIS — F329 Major depressive disorder, single episode, unspecified: Secondary | ICD-10-CM

## 2016-10-27 DIAGNOSIS — F411 Generalized anxiety disorder: Secondary | ICD-10-CM | POA: Diagnosis not present

## 2016-10-27 DIAGNOSIS — F32A Depression, unspecified: Secondary | ICD-10-CM

## 2016-10-27 DIAGNOSIS — I10 Essential (primary) hypertension: Secondary | ICD-10-CM

## 2016-10-27 DIAGNOSIS — G8929 Other chronic pain: Secondary | ICD-10-CM

## 2016-10-27 DIAGNOSIS — M545 Low back pain: Secondary | ICD-10-CM

## 2016-10-27 MED ORDER — BUPROPION HCL ER (SR) 150 MG PO TB12: 150.0000 mg | ORAL_TABLET | Freq: Two times a day (BID) | ORAL | 0 refills | Status: DC

## 2016-10-27 MED ORDER — LOSARTAN POTASSIUM 100 MG PO TABS: 100.0000 mg | ORAL_TABLET | Freq: Every day | ORAL | 3 refills | Status: DC

## 2016-10-27 MED ORDER — VIIBRYD 40 MG PO TABS: 40.0000 mg | ORAL_TABLET | Freq: Every day | ORAL | 0 refills | Status: DC

## 2016-10-27 MED ORDER — HYOSCYAMINE SULFATE 0.125 MG PO TABS: 0.1250 mg | ORAL_TABLET | ORAL | 1 refills | Status: DC | PRN

## 2016-10-27 MED ORDER — ALPRAZOLAM 0.5 MG PO TABS: 0.5000 mg | ORAL_TABLET | Freq: Two times a day (BID) | ORAL | 2 refills | Status: DC | PRN

## 2016-10-27 MED ORDER — DILTIAZEM HCL ER COATED BEADS 240 MG PO CP24: 240.0000 mg | ORAL_CAPSULE | Freq: Every day | ORAL | 1 refills | Status: DC

## 2016-10-27 MED ORDER — BUSPIRONE HCL 15 MG PO TABS: 15.0000 mg | ORAL_TABLET | Freq: Two times a day (BID) | ORAL | 1 refills | Status: DC

## 2016-10-27 NOTE — Progress Notes (Signed)
  Subjective:   [name], female    DOB: 1955/04/16, 61 y.o.   MRN: [id number] CC: Medication Refill  HPI: Sherri Howard is a 61 y.o. female presenting for Medication Refill  Depression: has been "really good" Anxiety: has been much worse Aunt with terminal cancer Had been trying to decrease xanax but recently taking 1mg  BID Has been taking 1mg  BID, up from 0.5-1mg  BID On viibryd Does not want to see counselor Has had a hard time scheduling it in the past  Has had R sided back pain off and on past 3 weeks, feels sharp No fevers No radiation of pain Not every day Has to rest and pain improves Comes on at rest and with activity Appetite has been ok  No CP, no HA Taking meds regularly Still with some SOB with exertion Recent stress test reported as normal  Relevant past medical, surgical, family and social history reviewed. Allergies and medications reviewed and updated. History  Smoking Status  . Never Smoker  Smokeless Tobacco  . Never Used   ROS: Per HPI   Objective:    BP 126/68   Pulse 83   Temp 98.1 F (36.7 C) (Oral)   Ht 5\' 5"  (1.651 m)   Wt 273 lb 9.6 oz (124.1 kg)   BMI 45.53 kg/m   Wt Readings from Last 3 Encounters:  10/27/16 273 lb 9.6 oz (124.1 kg)  10/09/16 274 lb (124.3 kg)  08/13/16 273 lb (123.8 kg)    Gen: NAD, alert, cooperative with exam, NCAT EYES: EOMI, no conjunctival injection, or no icterus LYMPH: no cervical LAD CV: NRRR, normal S1/S2, no murmur, distal pulses 2+ b/l Resp: CTABL, no wheezes, normal WOB Abd: +BS, soft, NTND.  Ext: No edema, warm Neuro: Alert and oriented, strength equal b/l UE and LE, coordination grossly normal MSK: normal muscle bulk, no point tenderness over spine, R sided paraspinal muscles slightly ttp Psych: normal affect, no thoughts of self harm  Assessment & Plan:  Sherri Howard was seen today for medication refill.  Diagnoses and all orders for this visit:  Generalized anxiety  disorder Increase buspar to 15mg  BID Cont viibryd Feels safe at home, no thoughts of self harm OK to cont xanax, will increase to #90 tabs in a month, can take 1-2 tabs BID, will not be able to fill sooner, must be seen for refills, discussed with pt Strongly recommended counseling Pt has list at home Will think about it -     ALPRAZolam (XANAX) 0.5 MG tablet; Take 1-2 tablets (0.5-1 mg total) by mouth 2 (two) times daily as needed.  Depression, unspecified depression type Stable, cont below, viibryd -     buPROPion (WELLBUTRIN SR) 150 MG 12 hr tablet; Take 1 tablet (150 mg total) by mouth 2 (two) times daily.  Essential hypertension Stable, cont below -     losartan (COZAAR) 100 MG tablet; Take 1 tablet (100 mg total) by mouth daily.  Chronic midline low back pain without sciatica -     Urinalysis, Complete   Follow up plan: Return in about 3 months (around 01/27/2017). Assunta Found, MD [place]

## 2016-11-03 ENCOUNTER — Encounter: Payer: Self-pay | Admitting: Pediatrics

## 2016-11-04 ENCOUNTER — Other Ambulatory Visit: Payer: Self-pay | Admitting: Pediatrics

## 2016-11-04 MED ORDER — HYOSCYAMINE SULFATE SL 0.125 MG SL SUBL: 0.1250 mg | SUBLINGUAL_TABLET | Freq: Three times a day (TID) | SUBLINGUAL | 2 refills | Status: DC | PRN

## 2016-11-07 ENCOUNTER — Telehealth: Payer: Self-pay | Admitting: Pediatrics

## 2016-11-07 ENCOUNTER — Ambulatory Visit (INDEPENDENT_AMBULATORY_CARE_PROVIDER_SITE_OTHER): Payer: BLUE CROSS/BLUE SHIELD

## 2016-11-07 ENCOUNTER — Other Ambulatory Visit: Payer: Self-pay | Admitting: Family Medicine

## 2016-11-07 ENCOUNTER — Other Ambulatory Visit: Payer: BLUE CROSS/BLUE SHIELD

## 2016-11-07 ENCOUNTER — Ambulatory Visit (INDEPENDENT_AMBULATORY_CARE_PROVIDER_SITE_OTHER): Payer: BLUE CROSS/BLUE SHIELD | Admitting: Family Medicine

## 2016-11-07 VITALS — BP 123/76 | HR 91 | Temp 97.4°F | Ht 65.0 in | Wt 274.0 lb

## 2016-11-07 DIAGNOSIS — Z87442 Personal history of urinary calculi: Secondary | ICD-10-CM

## 2016-11-07 DIAGNOSIS — R3129 Other microscopic hematuria: Secondary | ICD-10-CM | POA: Diagnosis not present

## 2016-11-07 DIAGNOSIS — N200 Calculus of kidney: Secondary | ICD-10-CM

## 2016-11-07 LAB — URINALYSIS, COMPLETE
BILIRUBIN UA: NEGATIVE
Glucose, UA: NEGATIVE
KETONES UA: NEGATIVE
Nitrite, UA: NEGATIVE
PH UA: 7 (ref 5.0–7.5)
Protein, UA: NEGATIVE
SPEC GRAV UA: 1.015 (ref 1.005–1.030)
UUROB: 0.2 mg/dL (ref 0.2–1.0)

## 2016-11-07 LAB — MICROSCOPIC EXAMINATION
Bacteria, UA: NONE SEEN
RENAL EPITHEL UA: NONE SEEN /HPF

## 2016-11-07 MED ORDER — TAMSULOSIN HCL 0.4 MG PO CAPS: 0.4000 mg | ORAL_CAPSULE | Freq: Every day | ORAL | 0 refills | Status: DC

## 2016-11-07 MED ORDER — HYDROCODONE-ACETAMINOPHEN 5-325 MG PO TABS: 1.0000 | ORAL_TABLET | Freq: Four times a day (QID) | ORAL | 0 refills | Status: DC | PRN

## 2016-11-07 MED ORDER — CEPHALEXIN 500 MG PO CAPS: 500.0000 mg | ORAL_CAPSULE | Freq: Four times a day (QID) | ORAL | 0 refills | Status: DC

## 2016-11-07 MED ORDER — FLUCONAZOLE 150 MG PO TABS: 150.0000 mg | ORAL_TABLET | Freq: Once | ORAL | 0 refills | Status: AC

## 2016-11-07 NOTE — Telephone Encounter (Signed)
Done

## 2016-11-07 NOTE — Patient Instructions (Addendum)
Please be very cautious while you are taking hydrocodone, especially in the setting of Xanax usage.  These medications combined can cause respiratory depression and increased in somnolence.  Please call your urologist for an urgent appointment.  Renal Colic Renal colic is pain that is caused by passing a kidney stone. The pain can be sharp and severe. It may be felt in the back, abdomen, side (flank), or groin. It can cause nausea. Renal colic can come and go. Follow these instructions at home: Watch your condition for any changes. The following actions may help to lessen any discomfort that you are feeling:  Take medicines only as directed by your health care provider.  Ask your health care provider if it is okay to take over-the-counter pain medicine.  Drink enough fluid to keep your urine clear or pale yellow. Drink 6-8 glasses of water each day.  Limit the amount of salt that you eat to less than 2 grams per day.  Reduce the amount of protein in your diet. Eat less meat, fish, nuts, and dairy.  Avoid foods such as spinach, rhubarb, nuts, or bran. These may make kidney stones more likely to form.  Contact a health care provider if:  You have a fever or chills.  Your urine smells bad or looks cloudy.  You have pain or burning when you pass urine. Get help right away if:  Your flank pain or groin pain suddenly worsens.  You become confused or disoriented or you lose consciousness. This information is not intended to replace advice given to you by your health care provider. Make sure you discuss any questions you have with your health care provider. Document Released: 10/09/2004 Document Revised: 06/05/2015 Document Reviewed: 11/09/2013 Elsevier Interactive Patient Education  [employer].

## 2016-11-07 NOTE — Telephone Encounter (Signed)
Patient aware.

## 2016-11-07 NOTE — Progress Notes (Signed)
Subjective: CC:?UTI PCP: Eustaquio Maize, MD OEU:MPNTI Sherri Howard is a 61 y.o. female presenting to clinic today for:  1. 1. Urinary symptoms Patient reports a 1 month h/o intermittent right sided posterior back pain and intermittent RUQ pain .  She reports intermittent episodes of urinary urgency and chills.  She notes that this feels similar to when she had renal stones in the past.  Denies urinary frequency, urgency, hematuria, fevers, chills, nausea, vomiting. Patient has used Tylenol and Aleve for symptoms.  Patient has a history of Pseudomonas urinary tract infection in 2017.  This was resistant to fluoroquinolones.  Patient is allergic to Cipro and sulfa antibiotics.  Past medical history significant for history of right-sided hydronephrosis and a history of renal stones, which required urologic removal and stenting in 2017.   Allergies  Allergen Reactions  . Ciprofloxacin Rash  . Sulfur Rash    Duplicate   . Enablex [Darifenacin Hydrobromide Er] Swelling    Causes swelling of patient's tongue  . Latex   . Prednisolone Other (See Comments)    Convulsions per pt  . Sulfa Antibiotics   . Influenza Vaccines Rash  . Oxycodone-Acetaminophen Rash  . Pneumococcal Vaccine Swelling and Rash    Lymph node swelling   Past Medical History:  Diagnosis Date  . Anxiety   . Depression   . Hypertension   . Kidney stones    Family History  Problem Relation Age of Onset  . Cancer Mother   . Aneurysm Mother   . Cancer Father   . Hypertension Father   . Alzheimer's disease Father     Current Outpatient Prescriptions:  .  acetaminophen (TYLENOL) 325 MG tablet, Take 650 mg by mouth every 6 (six) hours as needed for mild pain or moderate pain. , Disp: , Rfl:  .  ALPRAZolam (XANAX) 0.5 MG tablet, Take 1-2 tablets (0.5-1 mg total) by mouth 2 (two) times daily as needed., Disp: 90 tablet, Rfl: 2 .  B Complex Vitamins (VITAMIN B-COMPLEX PO), Take 1 tablet by mouth every evening., Disp: ,  Rfl:  .  buPROPion (WELLBUTRIN SR) 150 MG 12 hr tablet, Take 1 tablet (150 mg total) by mouth 2 (two) times daily., Disp: 180 tablet, Rfl: 0 .  busPIRone (BUSPAR) 15 MG tablet, Take 1 tablet (15 mg total) by mouth 2 (two) times daily., Disp: 180 tablet, Rfl: 1 .  cephALEXin (KEFLEX) 500 MG capsule, Take 1 capsule (500 mg total) by mouth 4 (four) times daily., Disp: 28 capsule, Rfl: 0 .  diltiazem (CARTIA XT) 240 MG 24 hr capsule, Take 1 capsule (240 mg total) by mouth daily., Disp: 90 capsule, Rfl: 1 .  Hyoscyamine Sulfate SL (LEVSIN/SL) 0.125 MG SUBL, Place 0.125 mg under the tongue 3 (three) times daily as needed., Disp: 90 each, Rfl: 2 .  losartan (COZAAR) 100 MG tablet, Take 1 tablet (100 mg total) by mouth daily., Disp: 90 tablet, Rfl: 3 .  Multiple Vitamins-Minerals (HAIR SKIN AND NAILS FORMULA PO), Take 1 tablet by mouth every evening. , Disp: , Rfl:  .  VIIBRYD 40 MG TABS, Take 1 tablet (40 mg total) by mouth daily., Disp: 90 tablet, Rfl: 0  Social Hx: non smoker.  Health Maintenance: Flu shot   ROS: Per HPI  Objective: Office vital signs reviewed. BP 123/76   Pulse 91   Temp (!) 97.4 F (36.3 C) (Oral)   Ht 5\' 5"  (1.651 m)   Wt 274 lb (124.3 kg)   BMI 45.60  kg/m   Physical Examination:  General: Awake, alert, obese, nontoxic, No acute distress HEENT: sclera white, MMM Cardio: regular rate and rhythm, S1S2 heard, no murmurs appreciated Pulm: clear to auscultation bilaterally, no wheezes, rhonchi or rales; normal work of breathing on room air MSK: moves all extremities independently, mild CVA TTP to the right  Dg Abd 1 View  Result Date: 11/07/2016 CLINICAL DATA:  Right flank pain.  History of kidney stones EXAM: ABDOMEN - 1 VIEW COMPARISON:  05/29/2016 FINDINGS: Stone conglomerate over the left renal fossa is essentially stable at 7 mm in total. Punctate stone over the right kidney. Postoperative bowel in the right abdomen. Normal bowel gas pattern. Rounded density in  the low pelvis is likely within the rectum. This density matches luminal contents elsewhere. IMPRESSION: Bilateral nephrolithiasis without definite change from 05/29/2016. Electronically Signed   By: Monte Fantasia M.D.   On: 11/07/2016 14:24   No results found for this or any previous visit (from the past 24 hour(s)).  Assessment/ Plan: 61 y.o. female   1. Other microscopic hematuria Urinalysis with trace blood, 1+ leukocytes.  Urine microscopy remarkable for 11-30 white blood cells, 3-10 red blood cells and 0-10 epithelials.  No bacteria were appreciated.  I suspect that the hematuria is secondary to renal stone.  See below. - Urinalysis, Complete - Urine Culture - DG Abd 1 View; Future - HYDROcodone-acetaminophen (NORCO) 5-325 MG tablet; Take 1 tablet by mouth every 6 (six) hours as needed for moderate pain.  Dispense: 20 tablet; Refill: 0 - Ambulatory referral to Urology  2. Renal stones Bilateral renal stones appreciated on KUB.  These appear stable from previous.  Because patient is symptomatic, I have treated her empirically with Keflex, Flomax and prescribed Norco to use sparingly as needed for pain.  I did recommend that she make an appointment with her urologist for further evaluation and management.  She has required stenting in the past as well as lithotripsy.Strict return precautions and reasons for emergent evaluation in the emergency department review with patient.  They voiced understanding and will follow-up as needed.  3. History of renal calculi - DG Abd 1 View; Future - HYDROcodone-acetaminophen (NORCO) 5-325 MG tablet; Take 1 tablet by mouth every 6 (six) hours as needed for moderate pain.  Dispense: 20 tablet; Refill: 0 - Ambulatory referral to Urology   Orders Placed This Encounter  Procedures  . Urine Culture  . DG Abd 1 View    Standing Status:   Future    Standing Expiration Date:   01/07/2018    Order Specific Question:   Reason for Exam (SYMPTOM  OR  DIAGNOSIS REQUIRED)    Answer:   hematuria, right sided back pain, h/o renal stones    Order Specific Question:   Is the patient pregnant?    Answer:   No    Order Specific Question:   Preferred imaging location?    Answer:   Internal  . Urinalysis, Complete   Meds ordered this encounter  Medications  . cephALEXin (KEFLEX) 500 MG capsule    Sig: Take 1 capsule (500 mg total) by mouth 4 (four) times daily.    Dispense:  28 capsule    Refill:  [place], DO [place] [phone]

## 2016-11-10 ENCOUNTER — Telehealth: Payer: Self-pay | Admitting: Pediatrics

## 2016-11-10 NOTE — Telephone Encounter (Signed)
Patient aware and verbalizes understanding. 

## 2016-11-10 NOTE — Telephone Encounter (Signed)
Prelim result is pseudomonas, which is what she grew last time.  I'm still waiting on the sensitivities which is why she has not been contacted just yet.  Please inform patient.

## 2016-11-10 NOTE — Telephone Encounter (Signed)
Patient is wanting urine culture results - please review and advise. Patient is also requesting her Labs and recent X-ray sent to urology- Dr. Lupita Leash. Information sent to [phone] as requested.

## 2016-11-11 DIAGNOSIS — N200 Calculus of kidney: Secondary | ICD-10-CM | POA: Diagnosis not present

## 2016-11-11 DIAGNOSIS — R109 Unspecified abdominal pain: Secondary | ICD-10-CM | POA: Diagnosis not present

## 2016-11-12 ENCOUNTER — Ambulatory Visit: Payer: BLUE CROSS/BLUE SHIELD | Admitting: Pediatrics

## 2016-11-12 LAB — URINE CULTURE

## 2016-11-13 DIAGNOSIS — L0100 Impetigo, unspecified: Secondary | ICD-10-CM | POA: Diagnosis not present

## 2016-11-17 ENCOUNTER — Other Ambulatory Visit: Payer: Self-pay | Admitting: Pediatrics

## 2016-11-17 ENCOUNTER — Other Ambulatory Visit: Payer: Self-pay | Admitting: Family Medicine

## 2016-11-17 ENCOUNTER — Encounter: Payer: Self-pay | Admitting: Pediatrics

## 2016-11-17 DIAGNOSIS — Z87442 Personal history of urinary calculi: Secondary | ICD-10-CM

## 2016-11-17 DIAGNOSIS — F32A Depression, unspecified: Secondary | ICD-10-CM

## 2016-11-17 DIAGNOSIS — F329 Major depressive disorder, single episode, unspecified: Secondary | ICD-10-CM

## 2016-11-17 DIAGNOSIS — R3129 Other microscopic hematuria: Secondary | ICD-10-CM

## 2016-11-17 MED ORDER — BUSPIRONE HCL 15 MG PO TABS: 15.0000 mg | ORAL_TABLET | Freq: Two times a day (BID) | ORAL | 1 refills | Status: DC

## 2016-11-17 NOTE — Telephone Encounter (Signed)
This is a controlled substance and was given for an acute issue.  Per the  STOP Act, patient will need to be seen again in office if she is requiring a refill.  Please inform patient.

## 2016-11-18 ENCOUNTER — Encounter: Payer: Self-pay | Admitting: Pediatrics

## 2016-11-19 DIAGNOSIS — N200 Calculus of kidney: Secondary | ICD-10-CM | POA: Diagnosis not present

## 2016-11-19 DIAGNOSIS — N281 Cyst of kidney, acquired: Secondary | ICD-10-CM | POA: Diagnosis not present

## 2016-12-17 DIAGNOSIS — N200 Calculus of kidney: Secondary | ICD-10-CM | POA: Diagnosis not present

## 2016-12-28 ENCOUNTER — Other Ambulatory Visit: Payer: Self-pay | Admitting: Pediatrics

## 2016-12-29 ENCOUNTER — Other Ambulatory Visit: Payer: Self-pay | Admitting: Pediatrics

## 2016-12-29 DIAGNOSIS — F411 Generalized anxiety disorder: Secondary | ICD-10-CM

## 2016-12-30 NOTE — Telephone Encounter (Signed)
OV 11/18/17 

## 2017-01-01 NOTE — Telephone Encounter (Signed)
Pt filled xanax two days ago per narcotic database, needs to be seen for refill, should have enough until next OV

## 2017-01-02 NOTE — Telephone Encounter (Signed)
Patient aware and states she does have plenty until her apt.

## 2017-01-28 ENCOUNTER — Encounter: Payer: Self-pay | Admitting: Pediatrics

## 2017-01-28 ENCOUNTER — Ambulatory Visit: Payer: BLUE CROSS/BLUE SHIELD | Admitting: Pediatrics

## 2017-01-28 DIAGNOSIS — I10 Essential (primary) hypertension: Secondary | ICD-10-CM

## 2017-01-28 DIAGNOSIS — F411 Generalized anxiety disorder: Secondary | ICD-10-CM

## 2017-01-28 DIAGNOSIS — R3129 Other microscopic hematuria: Secondary | ICD-10-CM

## 2017-01-28 DIAGNOSIS — F329 Major depressive disorder, single episode, unspecified: Secondary | ICD-10-CM

## 2017-01-28 DIAGNOSIS — F32A Depression, unspecified: Secondary | ICD-10-CM

## 2017-01-28 DIAGNOSIS — Z87442 Personal history of urinary calculi: Secondary | ICD-10-CM | POA: Diagnosis not present

## 2017-01-28 MED ORDER — DILTIAZEM HCL ER COATED BEADS 240 MG PO CP24: 240.0000 mg | ORAL_CAPSULE | Freq: Every day | ORAL | 1 refills | Status: DC

## 2017-01-28 MED ORDER — HYOSCYAMINE SULFATE SL 0.125 MG SL SUBL: 0.1250 mg | SUBLINGUAL_TABLET | Freq: Three times a day (TID) | SUBLINGUAL | 2 refills | Status: DC | PRN

## 2017-01-28 MED ORDER — DIAZEPAM 2 MG PO TABS: 2.0000 mg | ORAL_TABLET | Freq: Two times a day (BID) | ORAL | 2 refills | Status: DC | PRN

## 2017-01-28 MED ORDER — LOSARTAN POTASSIUM 100 MG PO TABS: 100.0000 mg | ORAL_TABLET | Freq: Every day | ORAL | 3 refills | Status: DC

## 2017-01-28 MED ORDER — HYDROCODONE-ACETAMINOPHEN 5-325 MG PO TABS: 1.0000 | ORAL_TABLET | Freq: Four times a day (QID) | ORAL | 0 refills | Status: DC | PRN

## 2017-01-28 MED ORDER — BUSPIRONE HCL 15 MG PO TABS: 15.0000 mg | ORAL_TABLET | Freq: Two times a day (BID) | ORAL | 1 refills | Status: DC

## 2017-01-28 MED ORDER — BUPROPION HCL ER (SR) 150 MG PO TB12: 150.0000 mg | ORAL_TABLET | Freq: Two times a day (BID) | ORAL | 0 refills | Status: DC

## 2017-01-28 MED ORDER — VIIBRYD 40 MG PO TABS: 40.0000 mg | ORAL_TABLET | Freq: Every day | ORAL | 1 refills | Status: DC

## 2017-01-28 NOTE — Progress Notes (Signed)
Subjective:   [name], female    DOB: 1955-04-02, 62 y.o.   MRN: [id number] CC: Follow-up (3 month) multiple med problems HPI: Sherri Howard is a 62 y.o. female presenting for Follow-up (3 month)  Kidney stones: asymptomatic now Follows with urology, has upcoming appt   Depression: "pretty good" No thoughts of self harm  Anxiety: ongoing symptoms, insides start shaking, gets nervous and stressed Takes two xanax when she has that feeling, improves usually over next hour, sometimes she has to take additional two xanax later in the day Thinks she takes it less than half the days of the week  Chronic pain: Indication for chronic opioid: chronic back pain, worse when she is up moving around a lot, some days worse than others Doesn't take pain medication every day but helps to keep her active when it does bother her Takes tylenol as needed, <3g a day Medication and dose: hydrocodone  # pills per month: 30 Last UDS date: due Pain contract signed (Y/N): yes Date narcotic database last reviewed (include red flags): today, no red flags   Depression screen [facility] 2/9 01/28/2017 10/27/2016 10/09/2016 07/23/2016 06/17/2016  Decreased Interest 0 0 1 1 2   Down, Depressed, Hopeless 0 0 1 1 1   PHQ - 2 Score 0 0 2 2 3   Altered sleeping - 0 1 1 3   Tired, decreased energy - 0 1 1 2   Change in appetite - 0 1 1 1   Feeling bad or failure about yourself  - 0 1 1 1   Trouble concentrating - 0 0 0 1  Moving slowly or fidgety/restless - 0 0 1 2  Suicidal thoughts - 0 0 0 0  PHQ-9 Score - 0 6 7 13   Difficult doing work/chores - - - Somewhat difficult Somewhat difficult  Some recent data might be hidden     Relevant past medical, surgical, family and social history reviewed. Allergies and medications reviewed and updated. Social History   Tobacco Use  Smoking Status Never Smoker  Smokeless Tobacco Never Used   ROS: Per HPI   Objective:    BP 130/74   Pulse 71   Temp (!) 97 F  (36.1 C) (Oral)   Ht 5\' 5"  (1.651 m)   Wt 277 lb 6.4 oz (125.8 kg)   BMI 46.16 kg/m   Wt Readings from Last 3 Encounters:  01/28/17 277 lb 6.4 oz (125.8 kg)  11/07/16 274 lb (124.3 kg)  10/27/16 273 lb 9.6 oz (124.1 kg)    Gen: NAD, alert, cooperative with exam, NCAT EYES: EOMI, no conjunctival injection, or no icterus CV: NRRR, normal S1/S2 Resp: CTABL, no wheezes, normal WOB Abd: +BS, soft, NTND. Ext: No edema, warm Neuro: Alert and oriented MSK: normal muscle bulk Psych: normal affect, mood is "pretty good", no thoughts of self harm  Assessment & Plan:  Sherri Howard was seen today for follow-up multiple med problems  Diagnoses and all orders for this visit:  Generalized anxiety disorder Will switch to longer acting benzo, from xanax to diazepam Goal to take as needed, not daily -     busPIRone (BUSPAR) 15 MG tablet; Take 1 tablet (15 mg total) by mouth 2 (two) times daily. -     VIIBRYD 40 MG TABS; Take 1 tablet (40 mg total) by mouth daily. -     diazepam (VALIUM) 2 MG tablet; Take 1 tablet (2 mg total) by mouth every 12 (twelve) hours as needed for anxiety.  Depression, unspecified depression  type Stable, cont current meds Cont to offer counseling, pt declines -     buPROPion (WELLBUTRIN SR) 150 MG 12 hr tablet; Take 1 tablet (150 mg total) by mouth 2 (two) times daily.  Essential hypertension Adequate control, cont below -     diltiazem (CARTIA XT) 240 MG 24 hr capsule; Take 1 capsule (240 mg total) by mouth daily. -     losartan (COZAAR) 100 MG tablet; Take 1 tablet (100 mg total) by mouth daily.  Low back pain Take below as needed, discussed ideally will stop valium or hydrocodone, cannot take both daily gien risk Has #30 tabs in a month Continue tylenol as needed, does have some pain relief with it. Discussed total daily tylenol should not exceed 3000mg   -     HYDROcodone-acetaminophen (NORCO) 5-325 MG tablet; Take 1-2 tablets by mouth every 6 (six) hours as needed  for moderate pain.   Follow up plan: Return in about 3 months (around 04/28/2017). Assunta Found, MD [place]

## 2017-02-06 DIAGNOSIS — G4733 Obstructive sleep apnea (adult) (pediatric): Secondary | ICD-10-CM | POA: Diagnosis not present

## 2017-02-12 MED ORDER — VIIBRYD 40 MG PO TABS: 40.0000 mg | ORAL_TABLET | Freq: Every day | ORAL | 1 refills | Status: DC

## 2017-02-12 MED ORDER — BUPROPION HCL ER (SR) 150 MG PO TB12: 150.0000 mg | ORAL_TABLET | Freq: Two times a day (BID) | ORAL | 0 refills | Status: DC

## 2017-02-12 MED ORDER — LOSARTAN POTASSIUM 100 MG PO TABS: 100.0000 mg | ORAL_TABLET | Freq: Every day | ORAL | 3 refills | Status: DC

## 2017-02-12 MED ORDER — BUSPIRONE HCL 15 MG PO TABS: 15.0000 mg | ORAL_TABLET | Freq: Two times a day (BID) | ORAL | 1 refills | Status: DC

## 2017-02-12 MED ORDER — DILTIAZEM HCL ER COATED BEADS 240 MG PO CP24: 240.0000 mg | ORAL_CAPSULE | Freq: Every day | ORAL | 1 refills | Status: DC

## 2017-02-13 ENCOUNTER — Other Ambulatory Visit: Payer: Self-pay | Admitting: Pediatrics

## 2017-02-13 DIAGNOSIS — F329 Major depressive disorder, single episode, unspecified: Secondary | ICD-10-CM

## 2017-02-13 DIAGNOSIS — F32A Depression, unspecified: Secondary | ICD-10-CM

## 2017-03-16 ENCOUNTER — Other Ambulatory Visit: Payer: Self-pay | Admitting: Pediatrics

## 2017-03-16 DIAGNOSIS — R3129 Other microscopic hematuria: Secondary | ICD-10-CM

## 2017-03-16 DIAGNOSIS — Z87442 Personal history of urinary calculi: Secondary | ICD-10-CM

## 2017-03-17 NOTE — Telephone Encounter (Signed)
Must be seen for both of these refills. Have discussed with pt at every office visit.

## 2017-03-17 NOTE — Telephone Encounter (Signed)
Appt made

## 2017-03-24 ENCOUNTER — Ambulatory Visit: Payer: BLUE CROSS/BLUE SHIELD | Admitting: Pediatrics

## 2017-03-24 ENCOUNTER — Encounter: Payer: Self-pay | Admitting: Pediatrics

## 2017-03-24 VITALS — BP 132/83 | HR 82 | Temp 98.2°F | Ht 65.0 in | Wt 276.4 lb

## 2017-03-24 DIAGNOSIS — I10 Essential (primary) hypertension: Secondary | ICD-10-CM | POA: Diagnosis not present

## 2017-03-24 DIAGNOSIS — Z6841 Body Mass Index (BMI) 40.0 and over, adult: Secondary | ICD-10-CM

## 2017-03-24 DIAGNOSIS — F329 Major depressive disorder, single episode, unspecified: Secondary | ICD-10-CM | POA: Diagnosis not present

## 2017-03-24 DIAGNOSIS — K582 Mixed irritable bowel syndrome: Secondary | ICD-10-CM

## 2017-03-24 DIAGNOSIS — G8929 Other chronic pain: Secondary | ICD-10-CM

## 2017-03-24 DIAGNOSIS — M545 Low back pain: Secondary | ICD-10-CM

## 2017-03-24 DIAGNOSIS — K3189 Other diseases of stomach and duodenum: Secondary | ICD-10-CM | POA: Diagnosis not present

## 2017-03-24 DIAGNOSIS — F411 Generalized anxiety disorder: Secondary | ICD-10-CM | POA: Diagnosis not present

## 2017-03-24 DIAGNOSIS — F32A Depression, unspecified: Secondary | ICD-10-CM

## 2017-03-24 MED ORDER — VIIBRYD 40 MG PO TABS: 40.0000 mg | ORAL_TABLET | Freq: Every day | ORAL | 1 refills | Status: DC

## 2017-03-24 MED ORDER — BUPROPION HCL ER (SR) 150 MG PO TB12: 150.0000 mg | ORAL_TABLET | Freq: Two times a day (BID) | ORAL | 2 refills | Status: DC

## 2017-03-24 MED ORDER — LOSARTAN POTASSIUM 100 MG PO TABS: 100.0000 mg | ORAL_TABLET | Freq: Every day | ORAL | 3 refills | Status: DC

## 2017-03-24 MED ORDER — HYDROCODONE-ACETAMINOPHEN 5-325 MG PO TABS: 1.0000 | ORAL_TABLET | Freq: Two times a day (BID) | ORAL | 0 refills | Status: DC | PRN

## 2017-03-24 MED ORDER — HYOSCYAMINE SULFATE SL 0.125 MG SL SUBL: 0.1250 mg | SUBLINGUAL_TABLET | Freq: Three times a day (TID) | SUBLINGUAL | 2 refills | Status: DC | PRN

## 2017-03-24 MED ORDER — ALPRAZOLAM 0.5 MG PO TABS: 0.5000 mg | ORAL_TABLET | Freq: Every day | ORAL | 1 refills | Status: DC | PRN

## 2017-03-24 MED ORDER — BUSPIRONE HCL 15 MG PO TABS: 15.0000 mg | ORAL_TABLET | Freq: Two times a day (BID) | ORAL | 0 refills | Status: DC

## 2017-03-24 MED ORDER — DILTIAZEM HCL ER COATED BEADS 240 MG PO CP24: 240.0000 mg | ORAL_CAPSULE | Freq: Every day | ORAL | 2 refills | Status: DC

## 2017-03-24 NOTE — Progress Notes (Signed)
Subjective:   [name], female    DOB: September 04, 1955, 62 y.o.   MRN: [id number] CC: Follow-up (Medication refill)  HPI: Sherri Howard is a 62 y.o. female presenting for Follow-up (Medication refill)  Anxiety: gets a ball of nerves in stomach comes at random. Takes two xanax to get relief, feel back to normal. Last took one yesterday.   Abdominal pain: Has pain in lower abd, upper abd some. Taking levsin sometimes for the abd pain.  Thinks it does help.  Notices a bulging at her midline when she tries to sit up from lying down.  No pain with that.  Pain: hurts in her lower back.  Pain assessment: Cause of pain- arthritis in lower back Pain location- 7/10 to 2/10 when she takes the hydrocone Frequency- What increases pain- standing, walking, sitting bother back pain more What makes pain Better- resting, pain medication Effects on ADL - sometimes in bed due to pain, pain medication often allows her to get usual things done. Any change in general medical condition- no  Current medications- hydrocodone 5mg  Effectiveness of current meds-helps keep her active, doing her usual ADLs. Adverse reactions form pain meds-none  Pill count performed-Yes Urine drug screen- Yes--due today Was the [facility] reviewed- yes  If yes were their any concerning findings? - no  Headache: has off and on behind R eye. Had 4-5 times past week. Lasts about 10 min. Has had some sinus drainage, some coughing past few days.  No pain right now.  No nausea associated with the pain.  She has been using her CPAP machine regularly  Depression: mood has been "OK".  No thoughts of self-harm.  Continues to be depressed when she thinks about her weight.  Relevant past medical, surgical, family and social history reviewed. Allergies and medications reviewed and updated. Social History   Tobacco Use  Smoking Status Never Smoker  Smokeless Tobacco Never Used   ROS: Per HPI   Objective:    BP 132/83    Pulse 82   Temp 98.2 F (36.8 C) (Oral)   Ht 5\' 5"  (1.651 m)   Wt 276 lb 6.4 oz (125.4 kg)   BMI 46.00 kg/m   Wt Readings from Last 3 Encounters:  03/24/17 276 lb 6.4 oz (125.4 kg)  01/28/17 277 lb 6.4 oz (125.8 kg)  11/07/16 274 lb (124.3 kg)    Gen: NAD, alert, cooperative with exam, NCAT EYES: EOMI, no conjunctival injection, or no icterus ENT:   OP without erythema LYMPH: no cervical LAD CV: NRRR, normal S1/S2, no murmur, distal pulses 2+ b/l Resp: CTABL, no wheezes, normal WOB Abd: +BS, soft, mildly tender with deep palpation throughout.. no guarding Ext: No edema, warm Neuro: Alert and oriented, strength equal b/l UE and LE, coordination grossly normal MSK: normal muscle bulk Psych: Normal affect.  No thoughts of self-harm.  Assessment & Plan:  Caytlin was seen today for follow-up multiple medical problems.  Diagnoses and all orders for this visit:  Chronic midline low back pain without sciatica Reviewed clinic narcotic policy, need for yearly urine drug screen.  Will need to have urine sample before able to send in prescriptions.  Patient was not able to leave sample today.  Okay to come back to leave sample. Hydrocodone does help with lower back pain and helps patient continue ADLs.  She does not take it before sleep.    -     ToxASSURE Select 13 (MW), Urine  Generalized anxiety disorder Some ongoing  symptoms.  Continue to encourage counseling.  Patient resistant.  Discussed do not take Xanax with hydrocodone at the same time, do not take either one for sleep.  Continue below medicines. -     busPIRone (BUSPAR) 15 MG tablet; Take 1 tablet (15 mg total) by mouth 2 (two) times daily. -     VIIBRYD 40 MG TABS; Take 1 tablet (40 mg total) by mouth daily. -     ALPRAZolam (XANAX) 0.5 MG tablet; Take 1 tablet (0.5 mg total) by mouth daily as needed for anxiety.  Depression, unspecified depression type Stable, continue below -     buPROPion (WELLBUTRIN SR) 150 MG 12 hr  tablet; Take 1 tablet (150 mg total) by mouth 2 (two) times daily.  Essential hypertension Stable, continue below -     diltiazem (CARTIA XT) 240 MG 24 hr capsule; Take 1 capsule (240 mg total) by mouth daily. -     losartan (COZAAR) 100 MG tablet; Take 1 tablet (100 mg total) by mouth daily.  Stomach spasm Some ongoing symptoms.  Continue below.  Patient requests further evaluation by GI.  Referral put in. -     Hyoscyamine Sulfate SL (LEVSIN/SL) 0.125 MG SUBL; Place 0.125 mg under the tongue 3 (three) times daily as needed.  Irritable bowel syndrome with both constipation and diarrhea -     Ambulatory referral to Gastroenterology  Class 3 severe obesity with body mass index (BMI) of 45.0 to 49.9 in adult, unspecified obesity type, unspecified whether serious comorbidity present ([place]) Continue to encourage left cell changes.  Follow up plan: Return in about 3 months (around 06/24/2017). Assunta Found, MD [place]

## 2017-03-24 NOTE — Telephone Encounter (Signed)
Called pt back. Explained clinic policy which follows [facility], CDC guidelines. Questioned answered. She will rtc to leave urine sample when able.

## 2017-03-25 ENCOUNTER — Other Ambulatory Visit: Payer: Self-pay | Admitting: *Deleted

## 2017-03-25 DIAGNOSIS — G8929 Other chronic pain: Secondary | ICD-10-CM | POA: Diagnosis not present

## 2017-03-25 DIAGNOSIS — K3189 Other diseases of stomach and duodenum: Secondary | ICD-10-CM

## 2017-03-25 DIAGNOSIS — M545 Low back pain: Secondary | ICD-10-CM | POA: Diagnosis not present

## 2017-03-25 MED ORDER — HYOSCYAMINE SULFATE SL 0.125 MG SL SUBL: 0.1250 mg | SUBLINGUAL_TABLET | Freq: Three times a day (TID) | SUBLINGUAL | 0 refills | Status: DC | PRN

## 2017-03-26 ENCOUNTER — Encounter: Payer: Self-pay | Admitting: Pediatrics

## 2017-03-26 DIAGNOSIS — M545 Low back pain, unspecified: Secondary | ICD-10-CM

## 2017-03-26 DIAGNOSIS — G8929 Other chronic pain: Secondary | ICD-10-CM

## 2017-03-27 ENCOUNTER — Other Ambulatory Visit: Payer: Self-pay | Admitting: Pediatrics

## 2017-03-27 DIAGNOSIS — Z87442 Personal history of urinary calculi: Secondary | ICD-10-CM

## 2017-03-27 DIAGNOSIS — R3129 Other microscopic hematuria: Secondary | ICD-10-CM

## 2017-03-27 MED ORDER — HYDROCODONE-ACETAMINOPHEN 5-325 MG PO TABS
1.0000 | ORAL_TABLET | Freq: Two times a day (BID) | ORAL | 0 refills | Status: DC | PRN
Start: 2017-04-24 — End: 2017-03-27

## 2017-03-27 MED ORDER — HYDROCODONE-ACETAMINOPHEN 5-325 MG PO TABS: 1.0000 | ORAL_TABLET | Freq: Two times a day (BID) | ORAL | 0 refills | Status: DC | PRN

## 2017-03-27 MED ORDER — HYDROCODONE-ACETAMINOPHEN 5-325 MG PO TABS: 1.0000 | ORAL_TABLET | Freq: Two times a day (BID) | ORAL | 0 refills | Status: AC | PRN

## 2017-03-31 LAB — TOXASSURE SELECT 13 (MW), URINE

## 2017-04-17 DIAGNOSIS — R197 Diarrhea, unspecified: Secondary | ICD-10-CM | POA: Diagnosis not present

## 2017-04-17 DIAGNOSIS — Z86010 Personal history of colonic polyps: Secondary | ICD-10-CM | POA: Diagnosis not present

## 2017-04-17 DIAGNOSIS — K582 Mixed irritable bowel syndrome: Secondary | ICD-10-CM | POA: Diagnosis not present

## 2017-04-17 DIAGNOSIS — I10 Essential (primary) hypertension: Secondary | ICD-10-CM | POA: Diagnosis not present

## 2017-04-22 ENCOUNTER — Encounter: Payer: Self-pay | Admitting: Pediatrics

## 2017-04-23 ENCOUNTER — Encounter: Payer: Self-pay | Admitting: Pediatrics

## 2017-05-15 ENCOUNTER — Encounter: Payer: Self-pay | Admitting: Family

## 2017-05-15 ENCOUNTER — Ambulatory Visit: Payer: BLUE CROSS/BLUE SHIELD | Admitting: Family

## 2017-05-15 VITALS — BP 129/76 | HR 89 | Temp 97.7°F | Ht 65.0 in | Wt 276.0 lb

## 2017-05-15 DIAGNOSIS — K449 Diaphragmatic hernia without obstruction or gangrene: Secondary | ICD-10-CM

## 2017-05-15 DIAGNOSIS — J301 Allergic rhinitis due to pollen: Secondary | ICD-10-CM | POA: Diagnosis not present

## 2017-05-15 DIAGNOSIS — J029 Acute pharyngitis, unspecified: Secondary | ICD-10-CM

## 2017-05-15 LAB — RAPID STREP SCREEN (MED CTR MEBANE ONLY): STREP GP A AG, IA W/REFLEX: NEGATIVE

## 2017-05-15 LAB — CULTURE, GROUP A STREP

## 2017-05-15 MED ORDER — FLUTICASONE PROPIONATE 50 MCG/ACT NA SUSP: 2.0000 | Freq: Every day | NASAL | 6 refills | Status: DC

## 2017-05-15 MED ORDER — CETIRIZINE HCL 10 MG PO TABS: 10.0000 mg | ORAL_TABLET | Freq: Every day | ORAL | 11 refills | Status: DC

## 2017-05-15 NOTE — Patient Instructions (Signed)

## 2017-05-15 NOTE — Progress Notes (Signed)
   Subjective:    [name], female    DOB: Nov 29, 1955, 62 y.o.   MRN: [id number]  Chief Complaint  Patient presents with  . referral to surgeon  . Sore Throat    for three weeks    Sore Throat   This is a new problem. The current episode started 1 to 4 weeks ago. The problem has been waxing and waning. There has been no fever. The pain is mild. Associated symptoms include congestion, coughing, ear pain, headaches, a hoarse voice, a plugged ear sensation, swollen glands and trouble swallowing. She has tried acetaminophen, gargles and NSAIDs for the symptoms. The treatment provided mild relief.  Hernia Pt complaining of bugling in her abdominal area. States she has had a hernia in the past and believe she has another. States she has pressure in epigastric area.    Review of Systems  HENT: Positive for congestion, ear pain, hoarse voice and trouble swallowing.   Respiratory: Positive for cough.   Neurological: Positive for headaches.  All other systems reviewed and are negative.      Objective:   Physical Exam  Constitutional: She is oriented to person, place, and time. She appears well-developed and well-nourished. No distress.  HENT:  Head: Normocephalic and atraumatic.  Right Ear: External ear normal.  Nose: Mucosal edema and rhinorrhea present.  Mouth/Throat: Posterior oropharyngeal erythema present.  Eyes: Pupils are equal, round, and reactive to light.  Neck: Normal range of motion. Neck supple. No thyromegaly present.  Cardiovascular: Normal rate, regular rhythm, normal heart sounds and intact distal pulses.  No murmur heard. Pulmonary/Chest: Effort normal and breath sounds normal. No respiratory distress. She has no wheezes.  Abdominal: Soft. Bowel sounds are normal. She exhibits no distension. There is no tenderness.  Hernia present epigastric area, no discoloration or pain noted  Musculoskeletal: Normal range of motion. She exhibits no edema or  tenderness.  Neurological: She is alert and oriented to person, place, and time. She has normal reflexes. No cranial nerve deficit.  Skin: Skin is warm and dry.  Psychiatric: She has a normal mood and affect. Her behavior is normal. Judgment and thought content normal.  Vitals reviewed.    BP 129/76   Pulse 89   Temp 97.7 F (36.5 C) (Oral)   Ht 5\' 5"  (1.651 m)   Wt 276 lb (125.2 kg)   BMI 45.93 kg/m      Assessment & Plan:  Addilyn was seen today for referral to surgeon and sore throat.  Diagnoses and all orders for this visit:  Sore throat -     Rapid Strep Screen ([facility] & [facility] ONLY)  Diaphragmatic hernia without obstruction and without gangrene -     Ambulatory referral to General Surgery  Allergic rhinitis due to pollen, unspecified seasonality -     fluticasone (FLONASE) 50 MCG/ACT nasal spray; Place 2 sprays into both nostrils daily. -     cetirizine (ZYRTEC) 10 MG tablet; Take 1 tablet (10 mg total) by mouth daily.    Strep negative, believe this is post nasal drip- Start zyrtec and flonase Referral to Surgery RTO prn or if symptoms worsen or do not improve  Evelina Dun, FNP

## 2017-05-29 ENCOUNTER — Encounter: Payer: Self-pay | Admitting: Pediatrics

## 2017-06-18 DIAGNOSIS — G4733 Obstructive sleep apnea (adult) (pediatric): Secondary | ICD-10-CM | POA: Diagnosis not present

## 2017-07-01 ENCOUNTER — Encounter: Payer: Self-pay | Admitting: Pediatrics

## 2017-07-02 ENCOUNTER — Other Ambulatory Visit: Payer: Self-pay | Admitting: Family

## 2017-07-02 ENCOUNTER — Encounter: Payer: Self-pay | Admitting: Pediatrics

## 2017-07-03 NOTE — Telephone Encounter (Signed)
Called patient to discuss.  Ultrasound not likely to show the information that we need to evaluate her hernia.  She does not want a CT scan or referral to surgery right now.  She will set up an appointment to see me.

## 2017-07-06 ENCOUNTER — Ambulatory Visit: Payer: BLUE CROSS/BLUE SHIELD | Admitting: Pediatrics

## 2017-07-06 ENCOUNTER — Encounter: Payer: Self-pay | Admitting: Pediatrics

## 2017-07-06 VITALS — BP 138/79 | HR 83 | Temp 98.9°F | Ht 65.0 in | Wt 279.2 lb

## 2017-07-06 DIAGNOSIS — I10 Essential (primary) hypertension: Secondary | ICD-10-CM

## 2017-07-06 DIAGNOSIS — R05 Cough: Secondary | ICD-10-CM

## 2017-07-06 DIAGNOSIS — Z6841 Body Mass Index (BMI) 40.0 and over, adult: Secondary | ICD-10-CM | POA: Diagnosis not present

## 2017-07-06 DIAGNOSIS — G4733 Obstructive sleep apnea (adult) (pediatric): Secondary | ICD-10-CM | POA: Diagnosis not present

## 2017-07-06 DIAGNOSIS — R059 Cough, unspecified: Secondary | ICD-10-CM

## 2017-07-06 DIAGNOSIS — F339 Major depressive disorder, recurrent, unspecified: Secondary | ICD-10-CM

## 2017-07-06 MED ORDER — VORTIOXETINE HBR 10 MG PO TABS: 10.0000 mg | ORAL_TABLET | Freq: Every day | ORAL | 1 refills | Status: DC

## 2017-07-06 MED ORDER — PANTOPRAZOLE SODIUM 40 MG PO TBEC
40.0000 mg | DELAYED_RELEASE_TABLET | Freq: Every day | ORAL | 3 refills | Status: DC
Start: 2017-07-06 — End: 2017-07-30

## 2017-07-06 NOTE — Progress Notes (Signed)
  Subjective:   [name], female    DOB: August 15, 1955, 62 y.o.   MRN: [id number] CC: Hernia (Upper abd) and Discuss CPap  HPI: Sherri Howard is a 62 y.o. female   Depression and anxiety: mood remains down, Has periods of anxiety, especially at night. has not noticed much improvement with buspar.  No thoughts of self harm.  OSA: needs new supplies for CPAP, not able to see sleep study results through care everywhere, last study 02/28/2015  Hernia: noticed upper abd bulge when she sits up from sitting, more prominent than it has been.   Elevated BMI: not walking regularly, open to walking more.   Has had a dry cough off and on past few weeks. Using lozenges throughout the day. No fevers. Feeling well otherwise.  Relevant past medical, surgical, family and social history reviewed. Allergies and medications reviewed and updated. Social History   Tobacco Use  Smoking Status Never Smoker  Smokeless Tobacco Never Used   ROS: Per HPI   Objective:    BP 138/79   Pulse 83   Temp 98.9 F (37.2 C) (Oral)   Ht 5\' 5"  (1.651 m)   Wt 279 lb 3.2 oz (126.6 kg)   BMI 46.46 kg/m   Wt Readings from Last 3 Encounters:  07/06/17 279 lb 3.2 oz (126.6 kg)  05/15/17 276 lb (125.2 kg)  03/24/17 276 lb 6.4 oz (125.4 kg)    Gen: NAD, alert, cooperative with exam, NCAT EYES: EOMI, no conjunctival injection, or no icterus ENT:  TMs slightly pink with layering white effusion b/l, OP with mild erythema LYMPH: no cervical LAD CV: NRRR, normal S1/S2, no murmur Resp: CTABL, no wheezes, normal WOB Abd: +BS, soft, NTND. Apprx 10 cm sliight epigastric bulge with tightening abd muscles, no guarding or organomegaly Ext: No edema, warm Neuro: Alert and oriented MSK: normal muscle bulk Psych: no thoughts of self harm, worsening anxiety and worry at night  Assessment & Plan:  Sherri Howard was seen today for hernia and discuss cpap.  Diagnoses and all orders for this visit:  Essential  hypertension Stable, cont current med  Cough Suspect at least partially related to allergies given TM effusions. Start daily antihistamine, flonase. -     pantoprazole (PROTONIX) 40 MG tablet; Take 1 tablet (40 mg total) by mouth daily.  BMI 45.0-49.9, adult ([place]) Counseled lifestyle changes, nutrition and physical activities goal. Pt interested in seeing nutirition -     Amb ref to Medical Nutrition Therapy-MNT  Depression, recurrent ([place]) Has tried many SSRIs, SNRIs in the past. Ongoing symptoms, will stop viibryd, start below.any worsening in symptoms to let me know. Offered referral to [facility], pt declined. -     vortioxetine HBr (TRINTELLIX) 10 MG TABS tablet; Take 1 tablet (10  mg total) by mouth daily.  OSA (obstructive sleep apnea) Requested sleep study results  Hernia No pain. Offered CT scan to better evaluate vs referral to surgeon if really bothering her. Pt does not want CT scan now, would like to avoid surgery. Discussed importance of weight loss, pt very agreeable to.  Follow up plan: Return in about 1 month (around 08/03/2017). Sherri Found, MD [place]

## 2017-07-06 NOTE — Patient Instructions (Addendum)
Zyrtec and flonase daily Take pantoprazole every morning 30 min before a meal Consider sinus rinses with netipot or similar, use distilled water  For anxiety Stop viibryd Start trintellix 5mg  x 7 days, then increase to 10mg 

## 2017-07-09 ENCOUNTER — Encounter: Payer: Self-pay | Admitting: Pediatrics

## 2017-07-09 DIAGNOSIS — H6690 Otitis media, unspecified, unspecified ear: Secondary | ICD-10-CM

## 2017-07-10 ENCOUNTER — Telehealth: Payer: Self-pay | Admitting: *Deleted

## 2017-07-10 ENCOUNTER — Telehealth: Payer: Self-pay | Admitting: Pediatrics

## 2017-07-10 ENCOUNTER — Encounter: Payer: Self-pay | Admitting: Pediatrics

## 2017-07-10 DIAGNOSIS — F411 Generalized anxiety disorder: Secondary | ICD-10-CM

## 2017-07-10 MED ORDER — AZITHROMYCIN 250 MG PO TABS: ORAL_TABLET | ORAL | 0 refills | Status: DC

## 2017-07-10 NOTE — Telephone Encounter (Signed)
Left message for patient to call back regarding CPAP prescription.  Needing setting on current CPAP machine

## 2017-07-10 NOTE — Telephone Encounter (Signed)
lmtcb regarding cpap prescription.  Needing CPAP settings on current machine

## 2017-07-13 ENCOUNTER — Telehealth: Payer: Self-pay | Admitting: Pediatrics

## 2017-07-13 NOTE — Telephone Encounter (Signed)
Pt has 2 small itchy patches on the inside of both ankles Pt instructed to try Benadryl cream or Hydrocortisone cream  Will call back if sxs worsen or persist

## 2017-07-14 NOTE — Telephone Encounter (Signed)
LMTCB

## 2017-07-20 MED ORDER — VIIBRYD 40 MG PO TABS: 40.0000 mg | ORAL_TABLET | Freq: Every day | ORAL | 1 refills | Status: DC

## 2017-07-20 NOTE — Addendum Note (Signed)
Addended by: Eustaquio Maize on: 07/20/2017 07:49 AM   Modules accepted: Orders

## 2017-07-29 NOTE — Telephone Encounter (Signed)
No call back - this encounter will be closed.  

## 2017-07-30 ENCOUNTER — Encounter: Payer: Self-pay | Admitting: Physician Assistant

## 2017-07-30 ENCOUNTER — Ambulatory Visit: Payer: BLUE CROSS/BLUE SHIELD | Admitting: Physician Assistant

## 2017-07-30 VITALS — BP 121/80 | HR 94 | Temp 97.3°F | Ht 65.0 in | Wt 274.0 lb

## 2017-07-30 DIAGNOSIS — R300 Dysuria: Secondary | ICD-10-CM

## 2017-07-30 DIAGNOSIS — R350 Frequency of micturition: Secondary | ICD-10-CM | POA: Diagnosis not present

## 2017-07-30 LAB — URINALYSIS, COMPLETE
BILIRUBIN UA: NEGATIVE
GLUCOSE, UA: NEGATIVE
KETONES UA: NEGATIVE
NITRITE UA: NEGATIVE
Specific Gravity, UA: 1.02 (ref 1.005–1.030)
Urobilinogen, Ur: 0.2 mg/dL (ref 0.2–1.0)
pH, UA: 6 (ref 5.0–7.5)

## 2017-07-30 LAB — MICROSCOPIC EXAMINATION
Renal Epithel, UA: NONE SEEN /hpf
WBC, UA: >30 /hpf — AB (ref 0–5)

## 2017-07-30 MED ORDER — NITROFURANTOIN MONOHYD MACRO 100 MG PO CAPS: 100.0000 mg | ORAL_CAPSULE | Freq: Two times a day (BID) | ORAL | 0 refills | Status: DC

## 2017-07-30 NOTE — Progress Notes (Signed)
  Subjective:     [name], female   DOB: 1955/11/01, 62 y.o.   MRN: [id number]  HPI Pt with polyuria and dysuria for 1 day States several days ago had some L flank pain Concerned this may be stone vs infection Past hx of multiple episodes of renal stones   Review of Systems  Constitutional: Positive for activity change. Negative for chills, diaphoresis and fever.  HENT: Negative.   Respiratory: Negative.   Cardiovascular: Negative.   Gastrointestinal: Negative.   Genitourinary: Positive for decreased urine volume, difficulty urinating, dysuria, flank pain and frequency. Negative for enuresis, hematuria and pelvic pain.       Objective:   Physical Exam  Constitutional: She appears well-developed and well-nourished. No distress.  Cardiovascular: Normal rate, regular rhythm and normal heart sounds.  Pulmonary/Chest: Effort normal and breath sounds normal.  Abdominal: Soft. She exhibits no distension and no mass. There is tenderness. There is no rebound and no guarding.  Obese  Skin: She is not diaphoretic.  Nursing note and vitals reviewed. UA- see labs     Assessment:     Dysuria    Plan:     Discussed with pt renal stone vs infection Will start on Macrobid bid x 5 days Hydrate well OTC AZO to help with sx F/U prn

## 2017-07-30 NOTE — Patient Instructions (Signed)

## 2017-08-03 ENCOUNTER — Encounter: Payer: Self-pay | Admitting: Pediatrics

## 2017-08-06 ENCOUNTER — Encounter: Payer: Self-pay | Admitting: Physician Assistant

## 2017-08-21 ENCOUNTER — Ambulatory Visit: Payer: BLUE CROSS/BLUE SHIELD | Admitting: Pediatrics

## 2017-08-21 ENCOUNTER — Encounter: Payer: Self-pay | Admitting: Family Medicine

## 2017-08-21 ENCOUNTER — Ambulatory Visit: Payer: BLUE CROSS/BLUE SHIELD | Admitting: Family Medicine

## 2017-08-21 ENCOUNTER — Ambulatory Visit (INDEPENDENT_AMBULATORY_CARE_PROVIDER_SITE_OTHER): Payer: BLUE CROSS/BLUE SHIELD

## 2017-08-21 VITALS — BP 131/71 | HR 91 | Temp 99.2°F | Ht 65.0 in | Wt 271.0 lb

## 2017-08-21 DIAGNOSIS — R109 Unspecified abdominal pain: Secondary | ICD-10-CM

## 2017-08-21 DIAGNOSIS — N200 Calculus of kidney: Secondary | ICD-10-CM | POA: Diagnosis not present

## 2017-08-21 DIAGNOSIS — J069 Acute upper respiratory infection, unspecified: Secondary | ICD-10-CM

## 2017-08-21 MED ORDER — HYDROCODONE-ACETAMINOPHEN 5-325 MG PO TABS: 1.0000 | ORAL_TABLET | Freq: Four times a day (QID) | ORAL | 0 refills | Status: DC | PRN

## 2017-08-21 MED ORDER — TAMSULOSIN HCL 0.4 MG PO CAPS: 0.4000 mg | ORAL_CAPSULE | Freq: Every day | ORAL | 0 refills | Status: DC

## 2017-08-21 MED ORDER — CEPHALEXIN 500 MG PO CAPS: 500.0000 mg | ORAL_CAPSULE | Freq: Four times a day (QID) | ORAL | 0 refills | Status: AC

## 2017-08-21 NOTE — Patient Instructions (Signed)
Flank Pain Flank pain is pain in your side. The flank is the area of your side between your upper belly (abdomen) and your back. The pain may occur over a short period of time (acute) or may be long-term or come back often (chronic). It may be mild or very bad. Pain in this area can be caused by many different things. Follow these instructions at home:  Rest as told by your doctor.  Drink enough fluid to keep your pee (urine) clear or pale yellow.  Take over-the-counter and prescription medicines only as told by your doctor.  Keep all follow-up visits as told by your doctor. This is important. Contact a doctor if:  Medicine does not help your pain.  You have new symptoms.  Your pain gets worse.  You have a fever.  Your symptoms last longer than 2-3 days. Get help right away if:  Your tummy hurts or is swollen.  You are short of breath.  You feel sick to your stomach (nauseous) and it does not go away.  You cannot stop throwing up (vomiting).  You feel like you will pass out or you do pass out (faint).  You have blood in your pee.  You have a fever and your symptoms suddenly get worse. This information is not intended to replace advice given to you by your health care provider. Make sure you discuss any questions you have with your health care provider. Document Released: 10/09/2007 Document Revised: 09/21/2015 Document Reviewed: 10/03/2014 Elsevier Interactive Patient Education  2018 Elsevier Inc.  

## 2017-08-21 NOTE — Progress Notes (Signed)
Subjective: CC: flank pain PCP: Eustaquio Maize, MD XTA:[id number] Sherri Howard is a 62 y.o. female presenting to clinic today for:  1. Flank pain Patient with a several day history of left-sided flank pain.  She notes urinary urgency and sensation of incomplete voiding.  She has had some nausea without vomiting.  She notes pallor and chills.  She notes the pain seems to be waxing and waning.  Past medical history significant for renal stones.  She is status post treatment with Macrobid for UTI.  No hematuria.  2.  URI symptoms Patient also reports sinus headache.  She has been using her Flonase and Zyrtec with no substantial improvement in symptoms.  Chills as above and nausea as above.   ROS: Per HPI  Allergies  Allergen Reactions  . Ciprofloxacin Rash  . Clindamycin Rash    Reaction unknown  . Influenza Vaccines Rash  . Latex Rash  . Other Rash    Bandages,etc.  . Sulfur Rash    Duplicate   . Temazepam Rash  . Enablex [Darifenacin Hydrobromide Er] Swelling    Causes swelling of patient's tongue  . Hydroxyzine Hcl     Anesthetic when had tonsillectomy. Caused convulsions.  . Prednisolone Other (See Comments)    Convulsions per pt  . Sulfa Antibiotics   . Amoxicillin Diarrhea  . Oxycodone-Acetaminophen Rash  . Pneumococcal Vaccine Swelling and Rash    Lymph node swelling   Past Medical History:  Diagnosis Date  . Anxiety   . Depression   . Hypertension   . Kidney stones     Current Outpatient Medications:  .  acetaminophen (TYLENOL) 325 MG tablet, Take 650 mg by mouth every 6 (six) hours as needed for mild pain or moderate pain. , Disp: , Rfl:  .  ALPRAZolam (XANAX) 0.5 MG tablet, Take 1 tablet (0.5 mg total) by mouth daily as needed for anxiety., Disp: 90 tablet, Rfl: 1 .  buPROPion (WELLBUTRIN SR) 150 MG 12 hr tablet, Take 1 tablet (150 mg total) by mouth 2 (two) times daily., Disp: 180 tablet, Rfl: 2 .  busPIRone (BUSPAR) 15 MG tablet, Take 1 tablet (15 mg total) by  mouth 2 (two) times daily., Disp: 180 tablet, Rfl: 0 .  cetirizine (ZYRTEC) 10 MG tablet, Take 1 tablet (10 mg total) by mouth daily., Disp: 30 tablet, Rfl: 11 .  diltiazem (CARTIA XT) 240 MG 24 hr capsule, Take 1 capsule (240 mg total) by mouth daily., Disp: 90 capsule, Rfl: 2 .  fluticasone (FLONASE) 50 MCG/ACT nasal spray, Place 2 sprays into both nostrils daily., Disp: 16 g, Rfl: 6 .  Hyoscyamine Sulfate SL (LEVSIN/SL) 0.125 MG SUBL, Place 0.125 mg under the tongue 3 (three) times daily as needed., Disp: 270 each, Rfl: 0 .  losartan (COZAAR) 100 MG tablet, Take 1 tablet (100 mg total) by mouth daily., Disp: 90 tablet, Rfl: 3 .  nitrofurantoin, macrocrystal-monohydrate, (MACROBID) 100 MG capsule, Take 1 capsule (100 mg total) by mouth 2 (two) times daily. 1 po BId, Disp: 10 capsule, Rfl: 0 .  VIIBRYD 40 MG TABS, Take 1 tablet (40 mg total) by mouth daily., Disp: 90 tablet, Rfl: 1 .  vortioxetine HBr (TRINTELLIX) 10 MG TABS tablet, Take 1 tablet (10 mg total) by mouth daily., Disp: 30 tablet, Rfl: 1 .  cephALEXin (KEFLEX) 500 MG capsule, Take 1 capsule (500 mg total) by mouth 4 (four) times daily for 7 days., Disp: 28 capsule, Rfl: 0 Social History   Socioeconomic History  .  Marital status: Married    Spouse name: Not on file  . Number of children: Not on file  . Years of education: Not on file  . Highest education level: Not on file  Occupational History  . Not on file  Social Needs  . Financial resource strain: Not on file  . Food insecurity:    Worry: Not on file    Inability: Not on file  . Transportation needs:    Medical: Not on file    Non-medical: Not on file  Tobacco Use  . Smoking status: Never Smoker  . Smokeless tobacco: Never Used  Substance and Sexual Activity  . Alcohol use: No  . Drug use: No  . Sexual activity: Not on file  Lifestyle  . Physical activity:    Days per week: Not on file    Minutes per session: Not on file  . Stress: Not on file  Relationships    . Social connections:    Talks on phone: Not on file    Gets together: Not on file    Attends religious service: Not on file    Active member of club or organization: Not on file    Attends meetings of clubs or organizations: Not on file    Relationship status: Not on file  . Intimate partner violence:    Fear of current or ex partner: Not on file    Emotionally abused: Not on file    Physically abused: Not on file    Forced sexual activity: Not on file  Other Topics Concern  . Not on file  Social History Narrative  . Not on file   Family History  Problem Relation Age of Onset  . Cancer Mother   . Aneurysm Mother   . Cancer Father   . Hypertension Father   . Alzheimer's disease Father     Objective: Office vital signs reviewed. BP 131/71   Pulse 91   Temp 99.2 F (37.3 C) (Oral)   Ht 5\' 5"  (1.651 m)   Wt 271 lb (122.9 kg)   BMI 45.10 kg/m   Physical Examination:  General: Awake, alert, obese, nontoxic, No acute distress HEENT: Normal    Neck: No masses palpated. No lymphadenopathy    Ears: Tympanic membranes intact, normal light reflex, no erythema, no bulging; bilateral effusion noted.    Eyes: PERRLA, extraocular membranes intact, sclera white    Nose: nasal turbinates moist, clear nasal discharge    Throat: moist mucus membranes, mild oropharyngeal erythema, no tonsillar exudate.  Airway is patent Cardio: regular rate and rhythm, S1S2 heard, no murmurs appreciated Pulm: clear to auscultation bilaterally, no wheezes, rhonchi or rales; normal work of breathing on room air GU: Suprapubic tenderness present.  Left-sided CVA tenderness present.  Assessment/ Plan: 62 y.o. female   1. Flank pain Patient with low-grade fever to 99.2 F here.  Vital signs are otherwise stable.  Urine dip with 1+ blood and 1+ leukocytes.  Nitrites are negative.  Urine microscopy pending.  This is been sent for urine culture.  Given her history of renal stones, KUB was obtained and  revealed a stable left-sided renal stone.  I have placed patient on Flomax daily for the next few days.  I also prescribed her Keflex 500 mg p.o. 4 times daily for 7 days to cover for any potential penetration into the kidneys.  Check CBC and BMP.  Will contact with results of urine culture once available.  Reasons for emergent evaluation discussed  with the patient.  She was good understanding. - Urinalysis, Complete - DG Abd 1 View; Future - Urine Culture - Basic Metabolic Panel - CBC with Differential  2. Nephrolithiasis Referred to urology given recurrence. - Ambulatory referral to Urology  3. Acute URI Likely viral.  However, Keflex should cover if it is indeed bacterial.  She will follow-up with PCP as needed.    Orders Placed This Encounter  Procedures  . Urine Culture  . DG Abd 1 View    Standing Status:   Future    Standing Expiration Date:   10/21/2018    Order Specific Question:   Reason for Exam (SYMPTOM  OR DIAGNOSIS REQUIRED)    Answer:   left flank pain. hx renal stones    Order Specific Question:   Is the patient pregnant?    Answer:   No    Order Specific Question:   Preferred imaging location?    Answer:   Internal  . Urinalysis, Complete  . Basic Metabolic Panel  . CBC with Differential   Meds ordered this encounter  Medications  . cephALEXin (KEFLEX) 500 MG capsule    Sig: Take 1 capsule (500 mg total) by mouth 4 (four) times daily for 7 days.    Dispense:  28 capsule    Refill:  0  . HYDROcodone-acetaminophen (NORCO) 5-325 MG tablet    Sig: Take 1 tablet by mouth every 6 (six) hours as needed for moderate pain.    Dispense:  15 tablet    Refill:  0  . tamsulosin (FLOMAX) 0.4 MG CAPS capsule    Sig: Take 1 capsule (0.4 mg total) by mouth daily.    Dispense:  10 capsule    Refill:  0   The Narcotic Database has been reviewed.  There were no red flags.      Sherri Norlander, DO [place] [phone]

## 2017-08-22 LAB — CBC WITH DIFFERENTIAL/PLATELET
Basophils Absolute: 0.1 10*3/uL (ref 0.0–0.2)
Basos: 1 %
EOS (ABSOLUTE): 0.2 10*3/uL (ref 0.0–0.4)
Eos: 3 %
Hematocrit: 43 % (ref 34.0–46.6)
Hemoglobin: 14.1 g/dL (ref 11.1–15.9)
Immature Grans (Abs): 0 10*3/uL (ref 0.0–0.1)
Immature Granulocytes: 0 %
Lymphocytes Absolute: 2.2 10*3/uL (ref 0.7–3.1)
Lymphs: 34 %
MCH: 29 pg (ref 26.6–33.0)
MCHC: 32.8 g/dL (ref 31.5–35.7)
MCV: 88 fL (ref 79–97)
Monocytes Absolute: 0.6 10*3/uL (ref 0.1–0.9)
Monocytes: 9 %
Neutrophils Absolute: 3.4 10*3/uL (ref 1.4–7.0)
Neutrophils: 53 %
Platelets: 301 10*3/uL (ref 150–450)
RBC: 4.87 x10E6/uL (ref 3.77–5.28)
RDW: 13.9 % (ref 12.3–15.4)
WBC: 6.4 10*3/uL (ref 3.4–10.8)

## 2017-08-22 LAB — BASIC METABOLIC PANEL
BUN/Creatinine Ratio: 17 (ref 9–23)
BUN: 21 mg/dL (ref 6–24)
CALCIUM: 9.7 mg/dL (ref 8.7–10.2)
CO2: 21 mmol/L (ref 20–29)
CREATININE: 1.27 mg/dL — AB (ref 0.57–1.00)
Chloride: 102 mmol/L (ref 96–106)
GFR, EST AFRICAN AMERICAN: 53 mL/min/{1.73_m2} — AB (ref >59)
GFR, EST NON AFRICAN AMERICAN: 46 mL/min/{1.73_m2} — AB (ref >59)
Glucose: 99 mg/dL (ref 65–99)
POTASSIUM: 5 mmol/L (ref 3.5–5.2)
Sodium: 140 mmol/L (ref 134–144)

## 2017-08-25 ENCOUNTER — Encounter: Payer: Self-pay | Admitting: Family Medicine

## 2017-08-25 LAB — URINALYSIS, COMPLETE
BILIRUBIN UA: NEGATIVE
Glucose, UA: NEGATIVE
Ketones, UA: NEGATIVE
Nitrite, UA: NEGATIVE
PH UA: 6 (ref 5.0–7.5)
PROTEIN UA: NEGATIVE
Specific Gravity, UA: 1.01 (ref 1.005–1.030)
UUROB: 0.2 mg/dL (ref 0.2–1.0)

## 2017-08-25 LAB — URINE CULTURE

## 2017-08-25 LAB — MICROSCOPIC EXAMINATION: RENAL EPITHEL UA: NONE SEEN /HPF

## 2017-09-07 ENCOUNTER — Other Ambulatory Visit: Payer: Self-pay | Admitting: Pediatrics

## 2017-09-07 DIAGNOSIS — G4733 Obstructive sleep apnea (adult) (pediatric): Secondary | ICD-10-CM | POA: Diagnosis not present

## 2017-09-07 DIAGNOSIS — F411 Generalized anxiety disorder: Secondary | ICD-10-CM

## 2017-09-07 NOTE — Telephone Encounter (Signed)
last seen 08/21/17

## 2017-09-13 ENCOUNTER — Other Ambulatory Visit: Payer: Self-pay | Admitting: Pediatrics

## 2017-09-13 DIAGNOSIS — F411 Generalized anxiety disorder: Secondary | ICD-10-CM

## 2017-09-15 ENCOUNTER — Encounter: Payer: Self-pay | Admitting: Pediatrics

## 2017-09-22 ENCOUNTER — Encounter: Payer: Self-pay | Admitting: Pediatrics

## 2017-09-22 ENCOUNTER — Telehealth: Payer: Self-pay | Admitting: *Deleted

## 2017-09-22 NOTE — Telephone Encounter (Signed)
Pt called in to request Buspar [facility] [place] Refill was sent into [facility] but pt ran out yesterday Pt requesting enough until mail order comes in Please advise

## 2017-09-23 ENCOUNTER — Other Ambulatory Visit: Payer: Self-pay | Admitting: Family Medicine

## 2017-09-23 DIAGNOSIS — F411 Generalized anxiety disorder: Secondary | ICD-10-CM

## 2017-09-23 MED ORDER — BUSPIRONE HCL 15 MG PO TABS: 15.0000 mg | ORAL_TABLET | Freq: Two times a day (BID) | ORAL | 4 refills | Status: DC

## 2017-09-23 NOTE — Telephone Encounter (Signed)
I sent in the requested prescription 

## 2017-09-23 NOTE — Telephone Encounter (Signed)
Patient aware that medication has been sent to pharmacy 

## 2017-10-09 ENCOUNTER — Encounter: Payer: Self-pay | Admitting: Pediatrics

## 2017-10-13 ENCOUNTER — Ambulatory Visit: Payer: BLUE CROSS/BLUE SHIELD | Admitting: Urology

## 2017-10-15 ENCOUNTER — Other Ambulatory Visit: Payer: Self-pay | Admitting: Pediatrics

## 2017-10-15 DIAGNOSIS — F411 Generalized anxiety disorder: Secondary | ICD-10-CM

## 2017-11-26 ENCOUNTER — Encounter: Payer: Self-pay | Admitting: Pediatrics

## 2017-11-26 ENCOUNTER — Ambulatory Visit: Payer: BLUE CROSS/BLUE SHIELD | Admitting: Pediatrics

## 2017-11-26 VITALS — BP 128/86 | HR 81 | Temp 98.0°F | Ht 65.0 in | Wt 271.4 lb

## 2017-11-26 DIAGNOSIS — I10 Essential (primary) hypertension: Secondary | ICD-10-CM

## 2017-11-26 DIAGNOSIS — R0789 Other chest pain: Secondary | ICD-10-CM

## 2017-11-26 DIAGNOSIS — Z6841 Body Mass Index (BMI) 40.0 and over, adult: Secondary | ICD-10-CM | POA: Diagnosis not present

## 2017-11-26 NOTE — Progress Notes (Signed)
  Subjective:   [name], female    DOB: April 01, 1955, 62 y.o.   MRN: [id number] CC: Hypertension (2 days)  HPI: Sherri Howard is a 62 y.o. female   Checking blood pressures at home for the last week, recently got blood pressure cuff, systolics 528U to 132G.  Diastolics 40N to 02V.   She is anxious around the time of blood pressure check.  She has not been rechecking it after the elevated numbers.  Chest pain: 3 days ago woke up in the morning feeling sore under her left breast.  Felt like someone pushing on it.  No shortness of breath, nausea, arm pain.  She had been dreaming about having chest pain right before she woke up and woke up with the same pain.  She has not had this before.  No problems with it since.  Minimally active, not regularly walking.  No chest pain with going up stairs.  She thinks blood pressure might of been elevated that morning, up to 253 systolics.  Bothered by ventral hernia off and on at times.  She is interested in weight loss.  Asked about the keto diet.  Interested in seeing a nutritionist.  Relevant past medical, surgical, family and social history reviewed. Allergies and medications reviewed and updated. Social History   Tobacco Use  Smoking Status Never Smoker  Smokeless Tobacco Never Used   ROS: Per HPI   Objective:    BP 128/86   Pulse 81   Temp 98 F (36.7 C) (Oral)   Ht '5\' 5"'$  (1.651 m)   Wt 271 lb 6.4 oz (123.1 kg)   BMI 45.16 kg/m   Wt Readings from Last 3 Encounters:  11/26/17 271 lb 6.4 oz (123.1 kg)  08/21/17 271 lb (122.9 kg)  07/30/17 274 lb (124.3 kg)    Gen: NAD, alert, cooperative with exam, NCAT EYES: EOMI, no conjunctival injection, or no icterus CV: NRRR, normal S1/S2, no murmur, distal pulses 2+ b/l Resp: CTABL, no wheezes, normal WOB Abd: +BS, soft, mildly tender to palpation throughout epigastric area ND. no guarding or organomegaly Ext: No edema, warm Neuro: Alert and oriented, strength equal b/l UE and LE,  coordination grossly normal MSK: normal muscle bulk  Assessment & Plan:  Radonna was seen today for hypertension.  Diagnoses and all orders for this visit:  Essential hypertension A few elevated blood pressures, discussed need more information.  Could consider ambulatory blood pressure monitoring if needed.  Patient going to check blood pressures twice a day and bring back for a visit in 2 weeks.  Continue current medicines.  Let me know if persistently elevated. -     BMP8+EGFR  BMI 45.0-49.9, adult [facility]) Discussed lifestyle changes, will refer to nutrition.  Okay to start daily walks, start with 5 to 10 minutes. -     Amb ref to Medical Nutrition Therapy-MNT  Atypical chest pain Negative for ischemia stress test in March 2018.  Done at that time for shortness of breath with exertion.  If symptoms return, let us know.  Follow up plan: Return in about 2 weeks (around 12/10/2017). Assunta Found, MD [place]

## 2017-11-27 LAB — BMP8+EGFR
BUN / CREAT RATIO: 16 (ref 12–28)
BUN: 19 mg/dL (ref 8–27)
CO2: 20 mmol/L (ref 20–29)
Calcium: 9.6 mg/dL (ref 8.7–10.3)
Chloride: 101 mmol/L (ref 96–106)
Creatinine, Ser: 1.19 mg/dL — ABNORMAL HIGH (ref 0.57–1.00)
GFR, EST AFRICAN AMERICAN: 57 mL/min/{1.73_m2} — AB (ref >59)
GFR, EST NON AFRICAN AMERICAN: 50 mL/min/{1.73_m2} — AB (ref >59)
Glucose: 89 mg/dL (ref 65–99)
Potassium: 4.4 mmol/L (ref 3.5–5.2)
SODIUM: 140 mmol/L (ref 134–144)

## 2017-12-08 ENCOUNTER — Encounter: Payer: Self-pay | Admitting: Pediatrics

## 2017-12-14 ENCOUNTER — Ambulatory Visit: Payer: BLUE CROSS/BLUE SHIELD | Admitting: Pediatrics

## 2017-12-14 ENCOUNTER — Encounter: Payer: Self-pay | Admitting: Pediatrics

## 2017-12-14 VITALS — BP 139/87 | HR 87 | Temp 97.4°F | Ht 65.0 in | Wt 269.4 lb

## 2017-12-14 DIAGNOSIS — F339 Major depressive disorder, recurrent, unspecified: Secondary | ICD-10-CM | POA: Diagnosis not present

## 2017-12-14 DIAGNOSIS — M545 Low back pain, unspecified: Secondary | ICD-10-CM

## 2017-12-14 DIAGNOSIS — I10 Essential (primary) hypertension: Secondary | ICD-10-CM | POA: Diagnosis not present

## 2017-12-14 DIAGNOSIS — H6593 Unspecified nonsuppurative otitis media, bilateral: Secondary | ICD-10-CM

## 2017-12-14 DIAGNOSIS — F411 Generalized anxiety disorder: Secondary | ICD-10-CM

## 2017-12-14 DIAGNOSIS — G8929 Other chronic pain: Secondary | ICD-10-CM | POA: Diagnosis not present

## 2017-12-14 MED ORDER — BUPROPION HCL ER (SR) 150 MG PO TB12: 150.0000 mg | ORAL_TABLET | Freq: Two times a day (BID) | ORAL | 2 refills | Status: DC

## 2017-12-14 MED ORDER — BUSPIRONE HCL 15 MG PO TABS: 15.0000 mg | ORAL_TABLET | Freq: Two times a day (BID) | ORAL | 4 refills | Status: DC

## 2017-12-14 MED ORDER — DILTIAZEM HCL ER COATED BEADS 240 MG PO CP24: 240.0000 mg | ORAL_CAPSULE | Freq: Every day | ORAL | 2 refills | Status: DC

## 2017-12-14 MED ORDER — HYDROCODONE-ACETAMINOPHEN 5-325 MG PO TABS: 1.0000 | ORAL_TABLET | Freq: Four times a day (QID) | ORAL | 0 refills | Status: DC | PRN

## 2017-12-14 MED ORDER — ALPRAZOLAM 0.5 MG PO TABS: 0.5000 mg | ORAL_TABLET | Freq: Every day | ORAL | 0 refills | Status: DC | PRN

## 2017-12-14 MED ORDER — CHLORTHALIDONE 25 MG PO TABS: 12.5000 mg | ORAL_TABLET | Freq: Every day | ORAL | 1 refills | Status: DC

## 2017-12-14 MED ORDER — VORTIOXETINE HBR 10 MG PO TABS: 10.0000 mg | ORAL_TABLET | Freq: Every day | ORAL | 1 refills | Status: DC

## 2017-12-14 MED ORDER — VIIBRYD 40 MG PO TABS: 40.0000 mg | ORAL_TABLET | Freq: Every day | ORAL | 1 refills | Status: DC

## 2017-12-14 NOTE — Progress Notes (Signed)
Subjective:   [name], female    DOB: 23-Aug-1955, 62 y.o.   MRN: [id number] CC: Blood Pressure Check and med refill HPI: Sherri Howard is a 62 y.o. female   HTN: has been checking at home.  Systolics 629U to 765Y.  Checked today her cuff versus our cuff, her cuff read systolic 650, ours 354.  He is using a cough from work, circumference of arm says 22 to 42 cm, her upper arm circumference 37 cm.  Anxiety: Improved control.  Taking Xanax as needed.  Not taking every day.  She thinks her mood has been doing well.  Taking Viibryd regularly.  Continues to have chronic low back pain.  Some days it bothers her more than others.  Bothers her more when she is more active.  Takes hydrocodone as needed.  She avoids taking it at the same time as her Xanax.  She does not take it every day.  Indication for chronic opioid: Chronic low back pain Medication and dose: Hydrocodone 5 mg/325 # pills per month: 30 tabs over 3 months Last UDS date: 03/2017 Opioid Treatment Agreement signed (Y/N): y Opioid Treatment Agreement last reviewed with patient:   Today Sherri Howard reviewed this encounter (include red flags):   yes, appropriate   She has been feeling some pressure in her ears bilaterally.  Some minor congestion.  No cough.  No fevers.  Appetite is been fine.  Not regularly using Flonase or Zyrtec.  Relevant past medical, surgical, family and social history reviewed. Allergies and medications reviewed and updated. Social History   Tobacco Use  Smoking Status Never Smoker  Smokeless Tobacco Never Used   ROS: Per HPI   Objective:    BP 139/87   Pulse 87   Temp (!) 97.4 F (36.3 C) (Oral)   Ht 5' 5"  (1.651 m)   Wt 269 lb 6.4 oz (122.2 kg)   BMI 44.83 kg/m   Wt Readings from Last 3 Encounters:  12/14/17 269 lb 6.4 oz (122.2 kg)  11/26/17 271 lb 6.4 oz (123.1 kg)  08/21/17 271 lb (122.9 kg)    Gen: NAD, alert, cooperative with exam, NCAT EYES: EOMI, no conjunctival injection,  or no icterus ENT:  TMs pearly gray with small layering white effusion bilaterally, OP without erythema LYMPH: no cervical LAD CV: NRRR, normal S1/S2, no murmur, distal pulses 2+ b/l Resp: CTABL, no wheezes, normal WOB Ext: No edema, warm Neuro: Alert and oriented MSK: normal muscle bulk  Assessment & Plan:  Sherri Howard was seen today for blood pressure check.  Diagnoses and all orders for this visit:  Chronic low back pain, unspecified back pain laterality, unspecified whether sciatica present Stable, will continue below, take as needed.  Avoid taking at night before bed, avoid taking at the same time as other medicines that cause sleepiness such as Xanax.  Does help improve function, ability to do ADLs and helps keep her active. -     HYDROcodone-acetaminophen (NORCO) 5-325 MG tablet; Take 1 tablet by mouth every 6 (six) hours as needed for moderate pain. -     ToxASSURE Select 13 (MW), Urine  Depression, recurrent (HCC) Stable on below, continue -     vortioxetine HBr (TRINTELLIX) 10 MG TABS tablet; Take 1 tablet (10 mg total) by mouth daily. -     buPROPion (WELLBUTRIN SR) 150 MG 12 hr tablet; Take 1 tablet (150 mg total) by mouth 2 (two) times daily.  Generalized anxiety disorder Stable on below, continue. -  VIIBRYD 40 MG TABS; Take 1 tablet (40 mg total) by mouth daily. -     busPIRone (BUSPAR) 15 MG tablet; Take 1 tablet (15 mg total) by mouth 2 (two) times daily. -     ALPRAZolam (XANAX) 0.5 MG tablet; Take 1 tablet (0.5 mg total) by mouth daily as needed for anxiety. Needs to be seen for more refills.  Essential hypertension Her home cuff size is on the upper end of size range, I suspect this may be at least partially responsible for ongoing elevated numbers.  Still elevated today with our cuff.  Lowest number at home was 481 systolics.  We will go ahead and add 12.5 mg of chlorthalidone, take daily in the morning.  Return to clinic in 2 weeks for repeat blood work and nurse  visit for blood pressure check. -     diltiazem (CARTIA XT) 240 MG 24 hr capsule; Take 1 capsule (240 mg total) by mouth daily. -     chlorthalidone (HYGROTON) 25 MG tablet; Take 0.5 tablets (12.5 mg total) by mouth daily. -     BMP8+EGFR; Future  Middle ear effusion Restart Flonase and Zyrtec, take daily for 2 weeks, then take as needed.  Follow up plan: Return in about 2 months (around 02/26/2018). Assunta Found, MD [place]

## 2017-12-14 NOTE — Patient Instructions (Signed)
Return in 2 weeks for nurse visit for BP check and blood work. You do not need to be fasting.

## 2017-12-15 ENCOUNTER — Encounter: Payer: Self-pay | Admitting: Pediatrics

## 2017-12-16 ENCOUNTER — Encounter: Payer: BLUE CROSS/BLUE SHIELD | Attending: Pediatrics | Admitting: Nutrition

## 2017-12-16 NOTE — Patient Instructions (Addendum)
Goals 1 Eat three meals per day 2. 15 minutes twice a day of walking three times per week. 3. Reduce Diet Sodas to twice a day 4. Water intake 4-5 bottles per day. Avoid snacks unless veggies.  Try to go to bed 12 midnight. Lose 1-2 lbs per week.

## 2017-12-16 NOTE — Progress Notes (Signed)
Medical Nutrition Therapy:  Appt start time: 1130 end time:  1230.   Assessment:  Primary concerns today: Obesity. BMI 44. Live with her husband. Desires to lose weight.  .Has been this weight for awhile. Admits to emotional eater. Eats 2-3 meals per day and grazer. She admits she stays up at night and sleeps in during the day. Eats at night. Doesn't eat meals on time. No balanced meals. Snacks mostly. Doesn't like vegetables. Doesn't fix a lot of meals. Some eating out. Diet is high in processed foods. Physical actviity: ADL Being treated for depression. Eats when she is depressed and upset and for boredom. Medication may affect her appetite also. Would benefit from seeing a therapist for her depression also. Lipid Panel     Component Value Date/Time   CHOL 193 04/14/2016 1304   TRIG 191 (H) 04/14/2016 1304   HDL 57 04/14/2016 1304   CHOLHDL 3.4 04/14/2016 1304   LDLCALC 98 04/14/2016 1304    CMP Latest Ref Rng & Units 11/26/2017 08/21/2017 10/09/2016  Glucose 65 - 99 mg/dL 89 99 96  BUN 8 - 27 mg/dL 19 21 17   Creatinine 0.57 - 1.00 mg/dL 1.19(H) 1.27(H) 1.00  Sodium 134 - 144 mmol/L 140 140 140  Potassium 3.5 - 5.2 mmol/L 4.4 5.0 4.0  Chloride 96 - 106 mmol/L 101 102 107  CO2 20 - 29 mmol/L 20 21 -  Calcium 8.7 - 10.3 mg/dL 9.6 9.7 -  Total Protein 6.0 - 8.5 g/dL - - -  Total Bilirubin 0.0 - 1.2 mg/dL - - -  Alkaline Phos 39 - 117 IU/L - - -  AST 0 - 40 IU/L - - -  ALT 0 - 32 IU/L - - -      Preferred Learning Style:     No preference indicated   Learning Readiness:    Ready  Change in progress   MEDICATIONS:    DIETARY INTAKE:    24-hr recall:  B ( AM):  Skips or diet diet dr pepper and little debbie  Snk ( AM): misc  L ( PM): ham, Dt Dr. Malachi Bonds Snk ( PM):  D ( PM):  Snk ( PM): 1 slice bread, ham, sweet rolls 2 Beverages: diet sodas,   Usual physical activity: none  Estimated energy needs: 1200  calories 135 g carbohydrates 90 g protein 33 g  fat  Progress Towards Goal(s):  In progress.   Nutritional Diagnosis:  NI-1.5 Excessive energy intake As related to emotionally eating.  As evidenced by BMI 44.    Intervention: Nutrition and Diabetes education provided on My Plate, CHO counting, meal planning, portion sizes, timing of meals, avoiding snacks between meals unless having a low blood sugar, target ranges for A1C and blood sugars, signs/symptoms and treatment of hyper/hypoglycemia, monitoring blood sugars, taking medications as prescribed, benefits of exercising 30 minutes per day and prevention of complications of DM. Marland KitchenGoals 1 Eat three meals per day 2. 15 minutes twice a day of walking three times per week. 3. Reduce Diet Sodas to twice a day 4. Water intake 4-5 bottles per day. Avoid snacks unless veggies.  Try to go to bed 12 midnight. Lose 1-2 lbs per week.   Teaching Method Utilized:  Visual Auditory Hands on  Handouts given during visit include:  The Plate Method   Meal Plan    Barriers to learning/adherence to lifestyle change: mobility due to her size  Demonstrated degree of understanding via:  Teach Back   Monitoring/Evaluation:  Dietary intake, exercise, meal planning , and body weight in 1 month(s).She would benefit from an A1C to evaluate for prediabetes or Type 2 DM.

## 2017-12-17 ENCOUNTER — Telehealth: Payer: Self-pay | Admitting: *Deleted

## 2017-12-17 DIAGNOSIS — I10 Essential (primary) hypertension: Secondary | ICD-10-CM

## 2017-12-17 DIAGNOSIS — F339 Major depressive disorder, recurrent, unspecified: Secondary | ICD-10-CM

## 2017-12-17 DIAGNOSIS — F411 Generalized anxiety disorder: Secondary | ICD-10-CM

## 2017-12-17 LAB — TOXASSURE SELECT 13 (MW), URINE

## 2017-12-17 MED ORDER — VIIBRYD 40 MG PO TABS: 40.0000 mg | ORAL_TABLET | Freq: Every day | ORAL | 1 refills | Status: DC

## 2017-12-17 MED ORDER — BUSPIRONE HCL 15 MG PO TABS: 15.0000 mg | ORAL_TABLET | Freq: Two times a day (BID) | ORAL | 1 refills | Status: DC

## 2017-12-17 MED ORDER — DILTIAZEM HCL ER COATED BEADS 240 MG PO CP24: 240.0000 mg | ORAL_CAPSULE | Freq: Every day | ORAL | 2 refills | Status: DC

## 2017-12-17 MED ORDER — BUPROPION HCL ER (SR) 150 MG PO TB12: 150.0000 mg | ORAL_TABLET | Freq: Two times a day (BID) | ORAL | 2 refills | Status: DC

## 2017-12-17 NOTE — Telephone Encounter (Signed)
She does not need the Trintellix.  Continue the [employer]erv and Wellbutrin.  I took the Trintellix off her medicine list.  Can she try the chlorthalidone Monday Wednesday Friday rather than every day?

## 2017-12-17 NOTE — Telephone Encounter (Signed)
Left detailed message advising pt of MD feedback and advised pt to call us back or send MyChart message.

## 2017-12-17 NOTE — Telephone Encounter (Signed)
Pt had sent MyChart messages requesting her refills go to [facility] and the other day they were [facility] to [facility] in [place]. She went to pick up Trintellix which was $180 and she wasn't aware she was to start this medication (so she didn't pick up) also the chlorithalidone is making her back hurt so she is going to stop taking it. I sent her Buspar, Wellbutrin, Vibryd, and diltiazem to [facility].   Does she need the Trintellix? Ok to stop Chlorithalidone?

## 2017-12-22 ENCOUNTER — Encounter: Payer: Self-pay | Admitting: Nutrition

## 2017-12-28 ENCOUNTER — Ambulatory Visit: Payer: BLUE CROSS/BLUE SHIELD | Admitting: *Deleted

## 2017-12-28 VITALS — BP 118/75 | HR 80

## 2017-12-28 DIAGNOSIS — Z0130 Encounter for examination of blood pressure without abnormal findings: Secondary | ICD-10-CM

## 2017-12-28 NOTE — Progress Notes (Signed)
Pt here for BP check BP 118 75 P 80  Pt states she discontinued the Chorthalidone > 2 weeks ago due to nausea

## 2018-01-19 ENCOUNTER — Encounter: Payer: Self-pay | Admitting: Pediatrics

## 2018-02-03 ENCOUNTER — Ambulatory Visit: Payer: BLUE CROSS/BLUE SHIELD | Admitting: Nutrition

## 2018-02-22 ENCOUNTER — Ambulatory Visit (INDEPENDENT_AMBULATORY_CARE_PROVIDER_SITE_OTHER): Payer: BLUE CROSS/BLUE SHIELD | Admitting: Nurse Practitioner

## 2018-02-22 ENCOUNTER — Encounter: Payer: Self-pay | Admitting: Nurse Practitioner

## 2018-02-22 VITALS — BP 118/75 | HR 76 | Temp 97.0°F | Ht 65.0 in | Wt 259.0 lb

## 2018-02-22 DIAGNOSIS — L739 Follicular disorder, unspecified: Secondary | ICD-10-CM | POA: Diagnosis not present

## 2018-02-22 DIAGNOSIS — Z01419 Encounter for gynecological examination (general) (routine) without abnormal findings: Secondary | ICD-10-CM

## 2018-02-22 NOTE — Patient Instructions (Signed)

## 2018-02-22 NOTE — Progress Notes (Signed)
   Subjective:    [name], female    DOB: 01/02/1956, 63 y.o.   MRN: [id number]   Chief Complaint: spot on vagina and Discuss labwork   HPI Patient come sin today c/o of lesion in perineal area. Noticed it about 1 week ago. Has gotten smaller and is not as painful as it was when she first noticed it. Wants to go ahead and have PAP today while she is being checked.  Review of Systems  Constitutional: Negative for activity change and appetite change.  HENT: Negative.   Eyes: Negative for pain.  Respiratory: Negative for shortness of breath.   Cardiovascular: Negative for chest pain, palpitations and leg swelling.  Gastrointestinal: Negative for abdominal pain.  Endocrine: Negative for polydipsia.  Genitourinary: Negative.   Skin: Negative for rash.  Neurological: Negative for dizziness, weakness and headaches.  Hematological: Does not bruise/bleed easily.  Psychiatric/Behavioral: Negative.   All other systems reviewed and are negative.      Objective:   Physical Exam Constitutional:      Appearance: She is obese.  Cardiovascular:     Rate and Rhythm: Normal rate and regular rhythm.     Heart sounds: Normal heart sounds.  Pulmonary:     Effort: Pulmonary effort is normal.     Breath sounds: Normal breath sounds.  Genitourinary:    General: Normal vulva.     Comments: Eft external vulva has small scabbed over 1cm lesion Skin:    General: Skin is warm.     Capillary Refill: Capillary refill takes less than 2 seconds.  Neurological:     General: No focal deficit present.     Mental Status: She is alert.  Psychiatric:        Mood and Affect: Mood normal.     BP 118/75   Pulse 76   Temp (!) 97 F (36.1 C) (Oral)   Ht 5\' 5"  (1.651 m)   Wt 259 lb (117.5 kg)   BMI 43.10 kg/m       Assessment & Plan:  Sherri Howard comes in today with chief complaint of spot on vagina and Discuss labwork   Diagnosis and orders addressed:  1. Folliculitis Warm  soaks Do not pick or scratch  2. Gynecologic exam normal - Pap IG (Image Guided)   Labs pending Health Maintenance reviewed Diet and exercise encouraged  Follow up plan: In the next month with PCP for controlled meds   Mary-Margaret Hassell Done, FNP

## 2018-02-25 ENCOUNTER — Ambulatory Visit: Payer: BLUE CROSS/BLUE SHIELD | Admitting: Pediatrics

## 2018-02-25 LAB — PAP IG (IMAGE GUIDED)

## 2018-03-01 ENCOUNTER — Ambulatory Visit: Payer: BLUE CROSS/BLUE SHIELD | Admitting: Family Medicine

## 2018-03-01 ENCOUNTER — Ambulatory Visit: Payer: BLUE CROSS/BLUE SHIELD | Admitting: Physician Assistant

## 2018-03-29 ENCOUNTER — Other Ambulatory Visit: Payer: Self-pay

## 2018-03-29 ENCOUNTER — Telehealth: Payer: Self-pay | Admitting: Family Medicine

## 2018-03-29 ENCOUNTER — Other Ambulatory Visit: Payer: Self-pay | Admitting: Pediatrics

## 2018-03-29 DIAGNOSIS — I10 Essential (primary) hypertension: Secondary | ICD-10-CM

## 2018-03-29 DIAGNOSIS — F411 Generalized anxiety disorder: Secondary | ICD-10-CM

## 2018-03-29 NOTE — Telephone Encounter (Signed)
Last seen 02/22/18

## 2018-03-29 NOTE — Telephone Encounter (Signed)
Pharmacy [facility]   PT has cancelled her apt for 3/18 due to Coronavirus, pt is wanting to know if we can send in an rx for the ALPRAZolam Duanne Moron) 0.5 MG tablet

## 2018-03-29 NOTE — Telephone Encounter (Signed)
Sent to [facility]

## 2018-03-30 MED ORDER — ALPRAZOLAM 0.5 MG PO TABS: 0.5000 mg | ORAL_TABLET | Freq: Every day | ORAL | 0 refills | Status: DC | PRN

## 2018-03-31 ENCOUNTER — Ambulatory Visit: Payer: BLUE CROSS/BLUE SHIELD | Admitting: Family Medicine

## 2018-04-07 ENCOUNTER — Other Ambulatory Visit: Payer: Self-pay | Admitting: Nurse Practitioner

## 2018-04-07 DIAGNOSIS — F411 Generalized anxiety disorder: Secondary | ICD-10-CM

## 2018-04-12 IMAGING — DX DG ABDOMEN 1V
2 series · 2 of 2 positions shown · non-contrast
Comparison: 05/29/2016

CLINICAL DATA: Right flank pain.  History of kidney stones

EXAM:
ABDOMEN - 1 VIEW

[abdomen kub (1 of 2)]
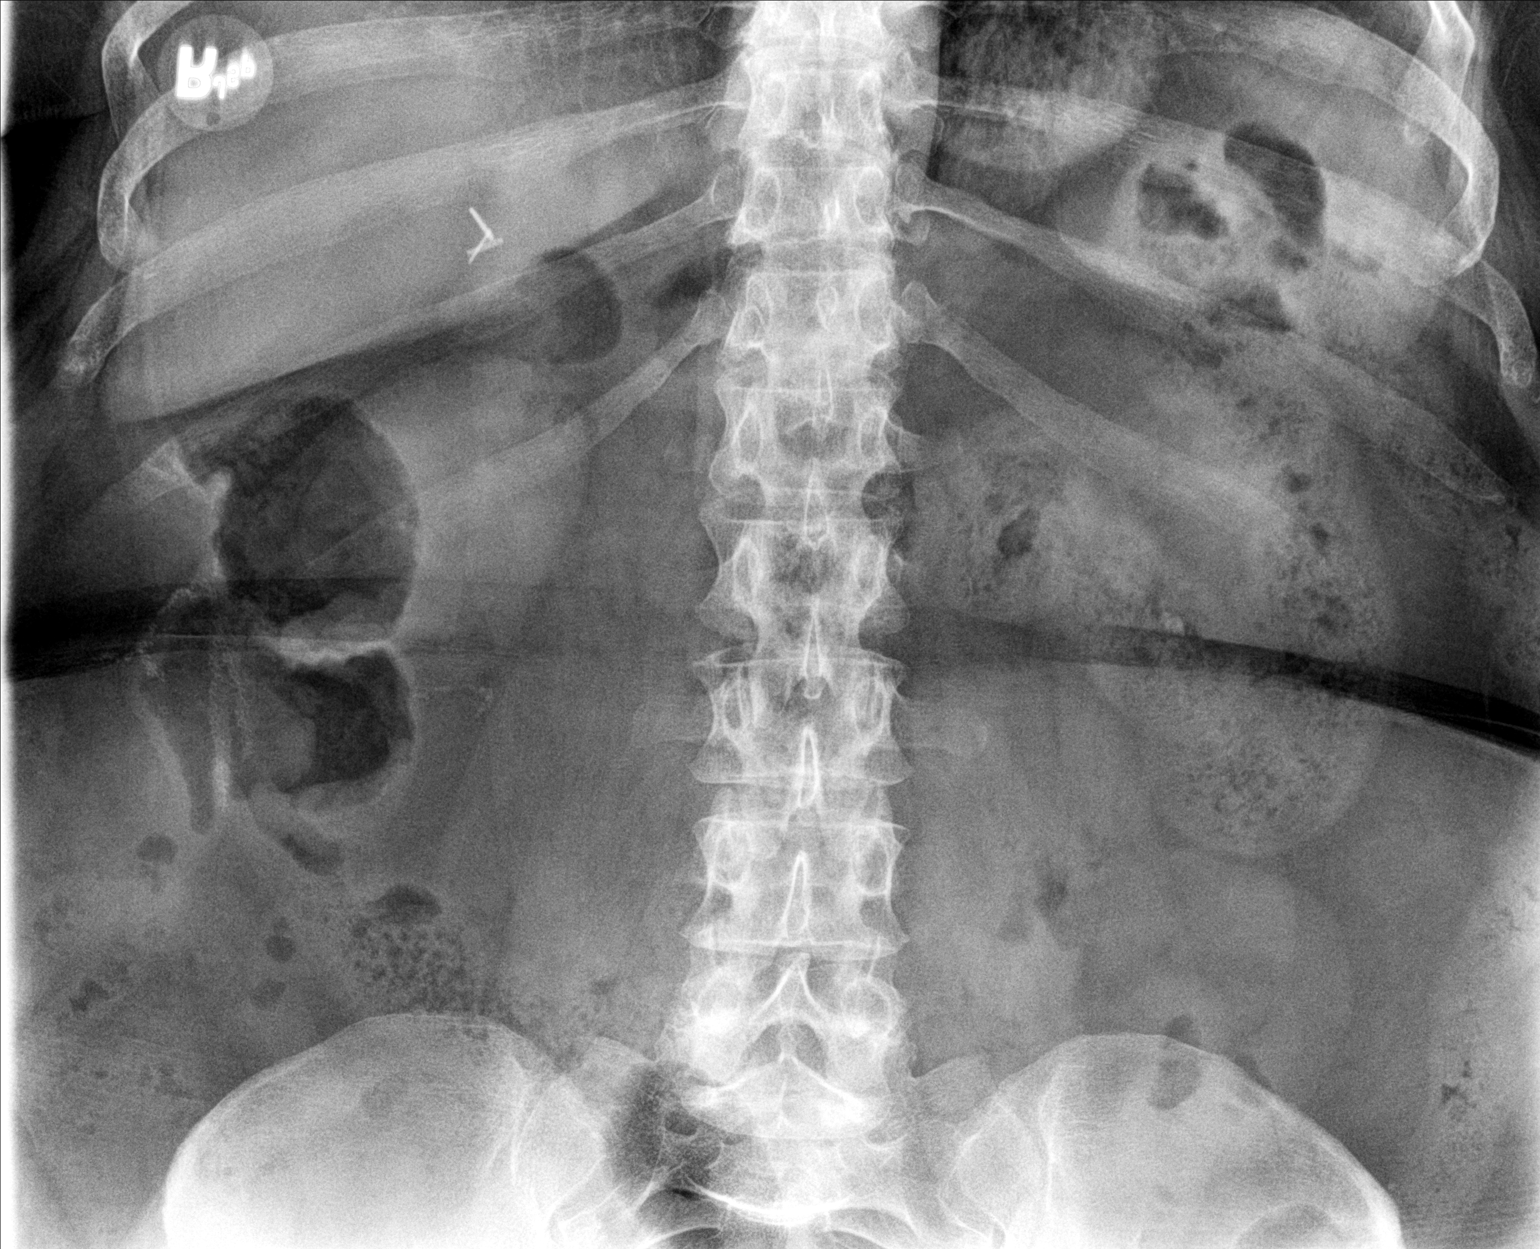

[abdomen kub (2 of 2)]
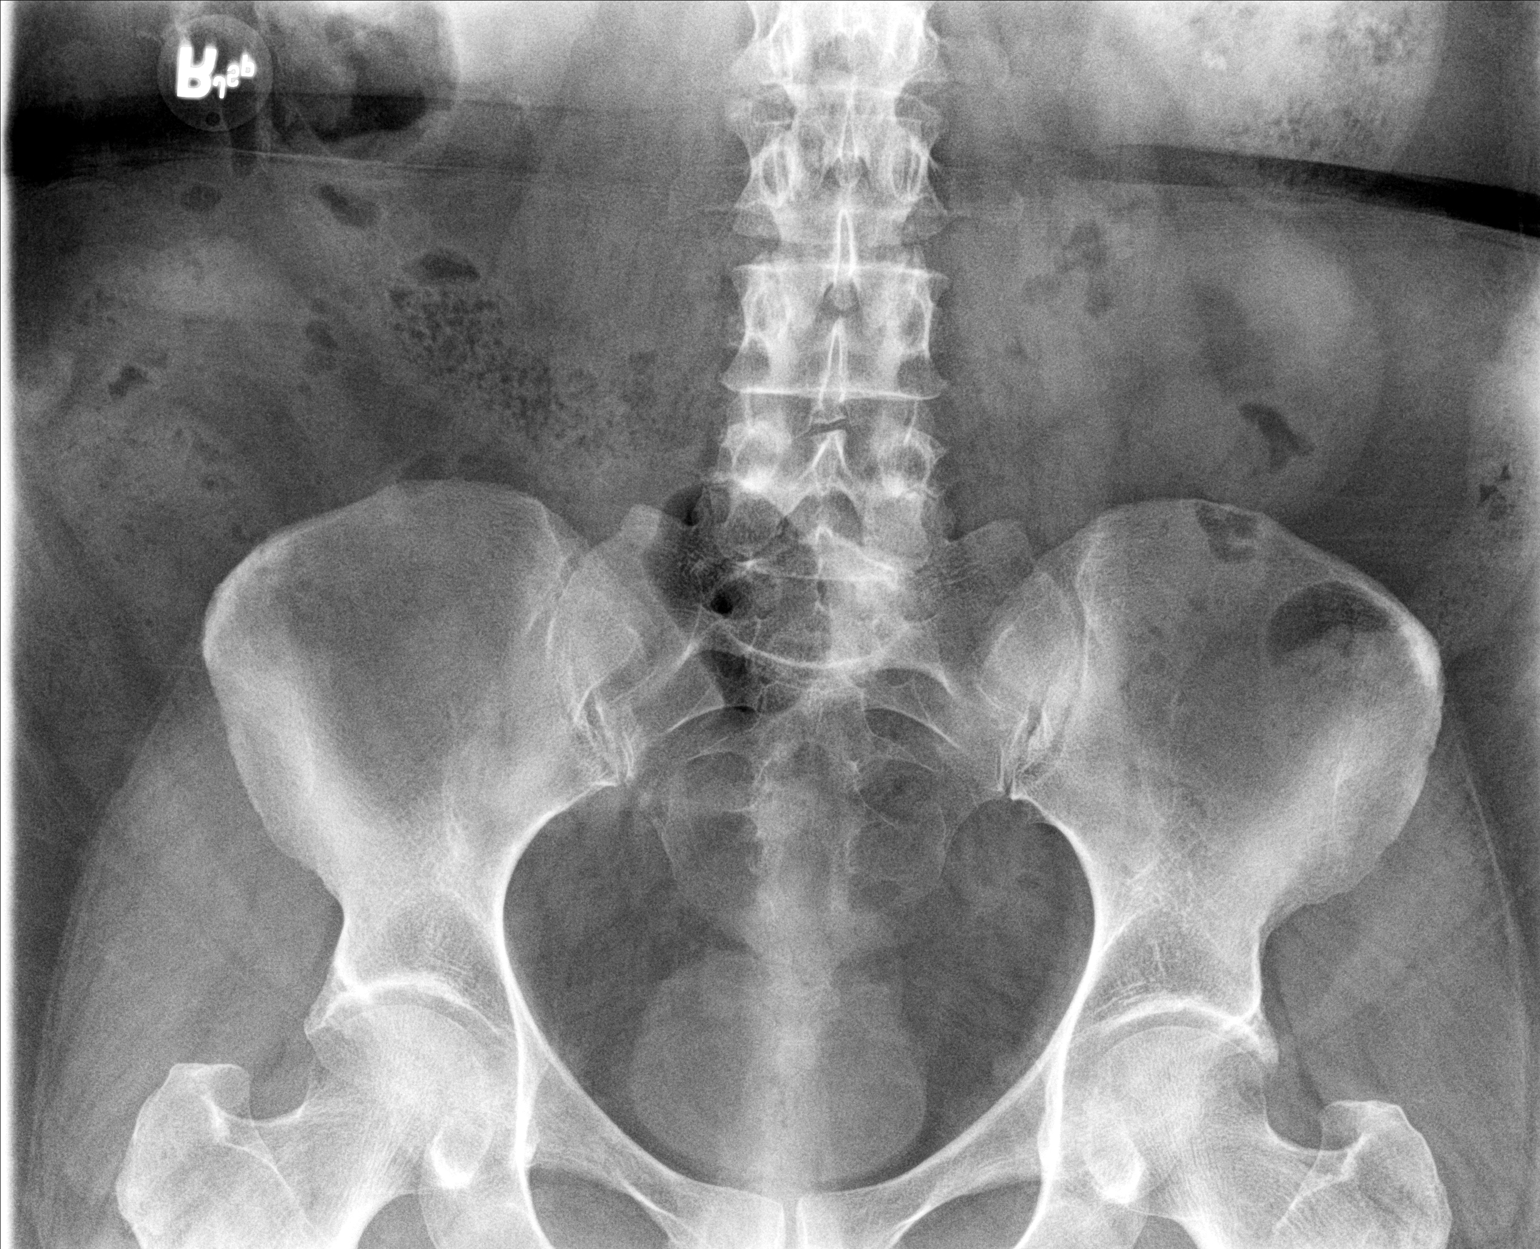

[2 of 2 positions shown; findings below may reference images not displayed]

FINDINGS: Stone conglomerate over the left renal fossa is essentially stable
at 7 mm in total. Punctate stone over the right kidney.
Postoperative bowel in the right abdomen. Normal bowel gas pattern.
Rounded density in the low pelvis is likely within the rectum. This
density matches luminal contents elsewhere.
IMPRESSION: Bilateral nephrolithiasis without definite change from 05/29/2016.

## 2018-04-30 ENCOUNTER — Encounter: Payer: Self-pay | Admitting: Family Medicine

## 2018-05-03 ENCOUNTER — Encounter: Payer: Self-pay | Admitting: Family Medicine

## 2018-05-03 ENCOUNTER — Telehealth: Payer: Self-pay | Admitting: Family Medicine

## 2018-05-04 ENCOUNTER — Ambulatory Visit (INDEPENDENT_AMBULATORY_CARE_PROVIDER_SITE_OTHER): Payer: BLUE CROSS/BLUE SHIELD | Admitting: Family Medicine

## 2018-05-04 ENCOUNTER — Encounter: Payer: Self-pay | Admitting: Family Medicine

## 2018-05-04 ENCOUNTER — Other Ambulatory Visit: Payer: Self-pay

## 2018-05-04 DIAGNOSIS — F339 Major depressive disorder, recurrent, unspecified: Secondary | ICD-10-CM

## 2018-05-04 DIAGNOSIS — I10 Essential (primary) hypertension: Secondary | ICD-10-CM | POA: Diagnosis not present

## 2018-05-04 DIAGNOSIS — F411 Generalized anxiety disorder: Secondary | ICD-10-CM

## 2018-05-04 MED ORDER — VIIBRYD 40 MG PO TABS: 40.0000 mg | ORAL_TABLET | Freq: Every day | ORAL | 1 refills | Status: DC

## 2018-05-04 MED ORDER — VILAZODONE HCL 40 MG PO TABS: 40.0000 mg | ORAL_TABLET | Freq: Every day | ORAL | 0 refills | Status: DC

## 2018-05-04 MED ORDER — BUSPIRONE HCL 10 MG PO TABS: 20.0000 mg | ORAL_TABLET | Freq: Two times a day (BID) | ORAL | 1 refills | Status: DC

## 2018-05-04 MED ORDER — CHLORTHALIDONE 25 MG PO TABS: 12.5000 mg | ORAL_TABLET | Freq: Every day | ORAL | 1 refills | Status: DC

## 2018-05-04 NOTE — Progress Notes (Signed)
Telephone visit  Subjective: CC: Follow-up anxiety/depression, hypertension PCP: [name], DO ZTI:WPYKD Sherri Howard is a 63 y.o. female calls for telephone consult today. Patient provides verbal consent for consult held via phone.  Location of patient: Home Location of provider: Working remotely from home Others present for call: None  1.  Anxiety/depression Patient was last seen for above issues by her previous PCP in December.  She reports that she has been compliant with the Viibryd and that she feels that it controls her symptoms better than any of the previous medication she is tried which includes Zoloft, Prozac, Effexor and Paxil.  All of these medications had given some improvement in symptoms but never any significant impact on her mood.  Previously she had quite a few lows and depressive symptoms and her mood causing crying spells but as of late she rarely has these.  She does take as needed Xanax perhaps a few times per week but uses this very sparingly.  She is prescribed Norco with last Rx in December but states again that she rarely takes this medication and only uses it for severe uncontrolled back pain.  She reports compliance with buspirone 15 mg twice daily.  She is interested in potentially coming off of the Wellbutrin which she is currently compliant with twice daily since she has such great control of her depressive symptoms with the Viibryd.  She would like to reduce the amount of medication she is taking as much as possible.  She goes on to state that she will often have sensation in her legs that is "springlike" she feels like she needs to get up and move around at night and throughout the day.  She is not sure the etiology of this.  No formal diagnosis of restless leg syndrome or previous treatment for restless leg.  2.  Hypertension Patient reports compliance with chlorthalidone, Cozaar, Cartia.  No chest pain or shortness of breath.   ROS: Per HPI  Allergies   Allergen Reactions  . Ciprofloxacin Rash  . Clindamycin Rash    Reaction unknown  . Influenza Vaccines Rash  . Latex Rash  . Other Rash    Bandages,etc.  . Sulfur Rash    Duplicate   . Temazepam Rash  . Enablex [Darifenacin Hydrobromide Er] Swelling    Causes swelling of patient's tongue  . Hydroxyzine Hcl     Anesthetic when had tonsillectomy. Caused convulsions.  . Prednisolone Other (See Comments)    Convulsions per pt  . Sulfa Antibiotics   . Amoxicillin Diarrhea  . Oxycodone-Acetaminophen Rash  . Pneumococcal Vaccine Swelling and Rash    Lymph node swelling   Past Medical History:  Diagnosis Date  . Anxiety   . Depression   . Hypertension   . Kidney stones     Current Outpatient Medications:  .  acetaminophen (TYLENOL) 325 MG tablet, Take 650 mg by mouth every 6 (six) hours as needed for mild pain or moderate pain. , Disp: , Rfl:  .  ALPRAZolam (XANAX) 0.5 MG tablet, Take 1 tablet (0.5 mg total) by mouth daily as needed for anxiety. Needs to be seen for more refills., Disp: 90 tablet, Rfl: 0 .  buPROPion (WELLBUTRIN SR) 150 MG 12 hr tablet, Take 1 tablet (150 mg total) by mouth 2 (two) times daily., Disp: 180 tablet, Rfl: 2 .  busPIRone (BUSPAR) 15 MG tablet, Take 1 tablet (15 mg total) by mouth 2 (two) times daily., Disp: 180 tablet, Rfl: 1 .  cetirizine (  ZYRTEC) 10 MG tablet, Take 1 tablet (10 mg total) by mouth daily., Disp: 30 tablet, Rfl: 11 .  chlorthalidone (HYGROTON) 25 MG tablet, Take 0.5 tablets (12.5 mg total) by mouth daily., Disp: 45 tablet, Rfl: 1 .  diltiazem (CARTIA XT) 240 MG 24 hr capsule, Take 1 capsule (240 mg total) by mouth daily., Disp: 90 capsule, Rfl: 2 .  fluticasone (FLONASE) 50 MCG/ACT nasal spray, Place 2 sprays into both nostrils daily., Disp: 16 g, Rfl: 6 .  HYDROcodone-acetaminophen (NORCO) 5-325 MG tablet, Take 1 tablet by mouth every 6 (six) hours as needed for moderate pain., Disp: 30 tablet, Rfl: 0 .  Hyoscyamine Sulfate SL  (LEVSIN/SL) 0.125 MG SUBL, Place 0.125 mg under the tongue 3 (three) times daily as needed., Disp: 270 each, Rfl: 0 .  losartan (COZAAR) 100 MG tablet, TAKE 1 TABLET DAILY, Disp: 90 tablet, Rfl: 0 .  VIIBRYD 40 MG TABS, Take 1 tablet (40 mg total) by mouth daily., Disp: 90 tablet, Rfl: 1  Depression screen [facility] 2/9 05/04/2018 02/22/2018 12/16/2017  Decreased Interest 2 0 0  Down, Depressed, Hopeless 1 0 0  PHQ - 2 Score 3 0 0  Altered sleeping 2 - -  Tired, decreased energy 2 - -  Change in appetite 1 - -  Feeling bad or failure about yourself  1 - -  Trouble concentrating 2 - -  Moving slowly or fidgety/restless 1 - -  Suicidal thoughts 0 - -  PHQ-9 Score 12 - -  Difficult doing work/chores Not difficult at all - -  Some recent data might be hidden   GAD 7 : Generalized Anxiety Score 05/04/2018 10/27/2016 05/15/2016 09/20/2015  Nervous, Anxious, on Edge 1 3 2 2   Control/stop worrying 1 3 2 2   Worry too much - different things 1 3 2 2   Trouble relaxing 1 3 2 3   Restless 2 1 0 2  Easily annoyed or irritable 1 1 0 1  Afraid - awful might happen 0 0 0 1  Total GAD 7 Score 7 14 8 13   Anxiety Difficulty Somewhat difficult Somewhat difficult Somewhat difficult Somewhat difficult   Assessment/ Plan: 63 y.o. female   1. Generalized anxiety disorder Her anxiety score has improved since check 2 years ago though is still somewhat elevated and subjectively she is feeling like anxiety is uncontrolled.  We discussed consideration for increase in the buspirone dosing and perhaps this will allow for her to come off of the benzodiazepine totally.  I have advised her to increase by 5 mg initially for 3 days before proceeding to 20 mg twice daily dosing.  This was reiterated in her AVS and verbally.  Continue Viibryd, this is been refilled.  Patient has had insufficient efficacy of previous SSRIs including Zoloft, Prozac, Effexor and Paxil.  We are attempting to wean Wellbutrin as below - VIIBRYD 40 MG TABS;  Take 1 tablet (40 mg total) by mouth daily.  Dispense: 90 tablet; Refill: 1 - busPIRone (BUSPAR) 10 MG tablet; Take 2 tablets (20 mg total) by mouth 2 (two) times daily.  Dispense: 360 tablet; Refill: 1  2. Depression, recurrent ([place]) Plan for continued use of Viibryd given refractory symptoms to other SSRIs as above.  Plan for weaning of Wellbutrin if possible to reduce polypharmacy.  Instructions for wean were discussed with the patient and reiterated on her after visit summary.  3. Essential hypertension Per her report controlled.  I have refilled her high [place].  No other refills needed  at this time. - chlorthalidone (HYGROTON) 25 MG tablet; Take 0.5 tablets (12.5 mg total) by mouth daily.  Dispense: 45 tablet; Refill: 1   Start time: 9:02am End time: 9:22am  Total time spent on patient care (including telephone call/ virtual visit): 25 minutes  [place], [place] [phone]

## 2018-05-04 NOTE — Patient Instructions (Addendum)
We seem to be having difficulty keeping our phone call connected.  Luckily, we reviewed everything.  To summarize:  We have renewed your Viibryd.  I have sent a 30-day supply to your local pharmacy in addition to the 90-day supply sent to your mail order.  We are going to taper your Wellbutrin (bupropion).  You will take 1 tablet daily for 1 week, then take 1 tablet every other day for 1 week, then stop.  If you find that your depressive symptoms worsen you may resume previous dosing of Wellbutrin.  We have increased your buspirone in efforts to control your anxiety better.  You will take 15 mg every morning and 20 mg every evening for 3 days.  You may then increase to 20 mg twice daily.  You may consider waiting until you are off the Wellbutrin before making this increase as your anxiety may improve with coming off of the Wellbutrin.  We discussed that if your leg sensation continues despite changes in medication as above, we may need to consider evaluation and treatment for restless leg syndrome.  Be well! We'll try and follow up in about 2 months, sooner if you need me!

## 2018-06-10 ENCOUNTER — Other Ambulatory Visit: Payer: Self-pay | Admitting: Nurse Practitioner

## 2018-06-10 DIAGNOSIS — F411 Generalized anxiety disorder: Secondary | ICD-10-CM

## 2018-06-10 NOTE — Telephone Encounter (Signed)
Last seen 05/04/2018 last filled 03/30/18

## 2018-06-10 NOTE — Telephone Encounter (Signed)
Since I have not prescribed this controlled substance for her before, we have to have an in person visit.  Can she schedule a time to pop in and see me?  When I last spoke to her she reported using this very sparingly, so I suspect she should not be out yet.

## 2018-06-11 NOTE — Telephone Encounter (Signed)
Patient aware and states she will call us to schedule appointment.

## 2018-06-14 ENCOUNTER — Encounter: Payer: Self-pay | Admitting: Family Medicine

## 2018-06-14 ENCOUNTER — Telehealth: Payer: Self-pay | Admitting: Family Medicine

## 2018-06-14 ENCOUNTER — Encounter: Payer: Self-pay | Admitting: Nurse Practitioner

## 2018-06-14 ENCOUNTER — Other Ambulatory Visit: Payer: Self-pay

## 2018-06-14 ENCOUNTER — Ambulatory Visit (INDEPENDENT_AMBULATORY_CARE_PROVIDER_SITE_OTHER): Payer: BLUE CROSS/BLUE SHIELD | Admitting: Nurse Practitioner

## 2018-06-14 DIAGNOSIS — R0602 Shortness of breath: Secondary | ICD-10-CM | POA: Diagnosis not present

## 2018-06-14 DIAGNOSIS — R1010 Upper abdominal pain, unspecified: Secondary | ICD-10-CM

## 2018-06-14 DIAGNOSIS — I10 Essential (primary) hypertension: Secondary | ICD-10-CM | POA: Diagnosis not present

## 2018-06-14 DIAGNOSIS — R110 Nausea: Secondary | ICD-10-CM

## 2018-06-14 DIAGNOSIS — R000 Tachycardia, unspecified: Secondary | ICD-10-CM | POA: Diagnosis not present

## 2018-06-14 NOTE — Telephone Encounter (Signed)
appt scheduled Pt notified 

## 2018-06-14 NOTE — Progress Notes (Signed)
[name], female   DOB: Nov 04, 1955, 63 y.o.   MRN: [id number]    Virtual Visit via telephone Note  I connected with Sherri Howard on 06/14/18 at 4:45PM by telephone and verified that I am speaking with the correct person using two identifiers. Sherri Howard is currently located at home and no one is currently with her during visit. The provider, Mary-Margaret Hassell Done, FNP is located in their office at time of visit.  I discussed the limitations, risks, security and privacy concerns of performing an evaluation and management service by telephone and the availability of in person appointments. I also discussed with the patient that there may be a patient responsible charge related to this service. The patient expressed understanding and agreed to proceed.   History and Present Illness:   Chief Complaint: Abdominal Pain   HPI Patient calls in today to discuss abdominal pain. Patient said it started last Wednesday and kept her up all night. The pain is located in center where ribs meet. Rates pain 7-8/10, and takes her breath away. Describes pain as achy sort of pain. Eating does not help. Pain is intermittent and last from 1-8 hours. She has tried OTC acid reflux meds which has not helped. She has some nausea but no vomiting. She says that this is not chest pain. Has occasional SOB.She was recently changed to chlothalidone and she has had increasing heart rate and low blood pressures. She was recently put on it .    Review of Systems  Constitutional: Negative.   Respiratory: Positive for shortness of breath (occasional).   Cardiovascular: Positive for palpitations.  Gastrointestinal: Positive for abdominal pain (?).  Neurological: Negative.   Psychiatric/Behavioral: Negative.   All other systems reviewed and are negative.    Observations/Objective: Alert and oriented Answers all questions appropriately Mild distress noted in voice  Assessment and Plan: Sherri Howard  in today with chief complaint of Abdominal Pain   1. Tachycardia  2. Essential hypertension  3. Pain of upper abdomen  4. SOB (shortness of breath)  5. Nausea  This was to complicated in an evisit. I could not rule out that she was not having chest pain rather than epigastric pain. Also needs to tweak her blood pressure meds because she sya blood pressure has been low. Also wanted her tachycardia evaluated.  * if develops chest pain tonight and will not resolve, may need to go to the ER  Follow Up Instructions: Tomorrow with Dr. Lajuana Ripple   I discussed the assessment and treatment plan with the patient. The patient was provided an opportunity to ask questions and all were answered. The patient agreed with the plan and demonstrated an understanding of the instructions.   The patient was advised to call back or seek an in-person evaluation if the symptoms worsen or if the condition fails to improve as anticipated.  The above assessment and management plan was discussed with the patient. The patient verbalized understanding of and has agreed to the management plan. Patient is aware to call the clinic if symptoms persist or worsen. Patient is aware when to return to the clinic for a follow-up visit. Patient educated on when it is appropriate to go to the emergency department.   Time call ended:  5:00  I provided 15 minutes of non-face-to-face time during this encounter.    Mary-Margaret Hassell Done, FNP

## 2018-06-15 ENCOUNTER — Other Ambulatory Visit: Payer: Self-pay

## 2018-06-15 ENCOUNTER — Ambulatory Visit (INDEPENDENT_AMBULATORY_CARE_PROVIDER_SITE_OTHER): Payer: BC Managed Care – PPO | Admitting: Family Medicine

## 2018-06-15 ENCOUNTER — Encounter: Payer: Self-pay | Admitting: Family Medicine

## 2018-06-15 DIAGNOSIS — R1013 Epigastric pain: Secondary | ICD-10-CM | POA: Diagnosis not present

## 2018-06-15 DIAGNOSIS — R000 Tachycardia, unspecified: Secondary | ICD-10-CM

## 2018-06-15 DIAGNOSIS — I10 Essential (primary) hypertension: Secondary | ICD-10-CM | POA: Diagnosis not present

## 2018-06-15 MED ORDER — PANTOPRAZOLE SODIUM 40 MG PO TBEC: 40.0000 mg | DELAYED_RELEASE_TABLET | Freq: Every day | ORAL | 3 refills | Status: DC

## 2018-06-15 NOTE — Progress Notes (Signed)
Telephone visit  Subjective: CC:f/u BP, abdominal pain PCP: [name], DO Sherri Howard is a 63 y.o. female calls for telephone consult today. Patient provides verbal consent for consult held via phone.  Location of patient: home Location of provider: [facility] Others present for call: husband  1. Epigastric pain/ tachycardia Patient reports that she had episodes of epigastric pain and associated tachycardia over the last couple of days.  She had some associated nausea but no vomiting.  No hematochezia or melena.  Bowel movements are normal.  She has had some chills but no fevers.  She is tolerating p.o. intake without difficulty.  She used a couple of doses of OTC acid reflux medicine.  She also took her Xanax and Tylenol in efforts to improve tachycardia and abdominal pain.  She of note states that symptoms seem to correlate with initiation of chlorthalidone.  This was actually previously prescribed by her PCP but she notes that she never was taking it and only was taking the diltiazem and losartan.  She is had blood pressures as low as 94/64 and heart rate as high as 160.  She is not checked her blood pressure today and notes that she is feeling somewhat better.  Heart rate is 96.   ROS: Per HPI  Allergies  Allergen Reactions  . Ciprofloxacin Rash  . Clindamycin Rash    Reaction unknown  . Influenza Vaccines Rash  . Latex Rash  . Other Rash    Bandages,etc.  . Sulfur Rash    Duplicate   . Temazepam Rash  . Enablex [Darifenacin Hydrobromide Er] Swelling    Causes swelling of patient's tongue  . Hydroxyzine Hcl     Anesthetic when had tonsillectomy. Caused convulsions.  . Prednisolone Other (See Comments)    Convulsions per pt  . Sulfa Antibiotics   . Amoxicillin Diarrhea  . Oxycodone-Acetaminophen Rash  . Pneumococcal Vaccine Swelling and Rash    Lymph node swelling   Past Medical History:  Diagnosis Date  . Anxiety   . Depression   . Hypertension   . Kidney  stones     Current Outpatient Medications:  .  acetaminophen (TYLENOL) 325 MG tablet, Take 650 mg by mouth every 6 (six) hours as needed for mild pain or moderate pain. , Disp: , Rfl:  .  ALPRAZolam (XANAX) 0.5 MG tablet, Take 1 tablet (0.5 mg total) by mouth daily as needed for anxiety. Needs to be seen for more refills., Disp: 90 tablet, Rfl: 0 .  busPIRone (BUSPAR) 10 MG tablet, Take 2 tablets (20 mg total) by mouth 2 (two) times daily., Disp: 360 tablet, Rfl: 1 .  cetirizine (ZYRTEC) 10 MG tablet, Take 1 tablet (10 mg total) by mouth daily., Disp: 30 tablet, Rfl: 11 .  chlorthalidone (HYGROTON) 25 MG tablet, Take 0.5 tablets (12.5 mg total) by mouth daily., Disp: 45 tablet, Rfl: 1 .  diltiazem (CARTIA XT) 240 MG 24 hr capsule, Take 1 capsule (240 mg total) by mouth daily., Disp: 90 capsule, Rfl: 2 .  fluticasone (FLONASE) 50 MCG/ACT nasal spray, Place 2 sprays into both nostrils daily., Disp: 16 g, Rfl: 6 .  HYDROcodone-acetaminophen (NORCO) 5-325 MG tablet, Take 1 tablet by mouth every 6 (six) hours as needed for moderate pain., Disp: 30 tablet, Rfl: 0 .  Hyoscyamine Sulfate SL (LEVSIN/SL) 0.125 MG SUBL, Place 0.125 mg under the tongue 3 (three) times daily as needed., Disp: 270 each, Rfl: 0 .  losartan (COZAAR) 100 MG tablet, TAKE 1  TABLET DAILY, Disp: 90 tablet, Rfl: 0 .  VIIBRYD 40 MG TABS, Take 1 tablet (40 mg total) by mouth daily., Disp: 90 tablet, Rfl: 1 .  Vilazodone HCl (VIIBRYD) 40 MG TABS, Take 1 tablet (40 mg total) by mouth daily., Disp: 30 tablet, Rfl: 0  Assessment/ Plan: 63 y.o. female   1. Benign essential hypertension Having episodes of hypotension.  Appears that she had actually not been taking the chlorthalidone, which was prescribed her previous PCP up until recently.  She is also having tachycardia which I suspect again related to the episodes of hypotension.  I have asked that she discontinue chlorthalidone.  Okay to continue other medications as directed.  She is to  monitor both blood pressure and heart rate.  If she continues to have low blood pressures or elevated heart rate, low threshold to be evaluated.  2. Tachycardia As above.  We discussed reasons to seek immediate emergent medical attention  3. Epigastric abdominal pain Epigastric pain suggestive of uncontrolled GERD.  Empiric treatment with PPI. - pantoprazole (PROTONIX) 40 MG tablet; Take 1 tablet (40 mg total) by mouth daily.  Dispense: 30 tablet; Refill: 3   Start time: 12:17pm End time: 12:31pm  Total time spent on patient care (including telephone call/ virtual visit): 18 minutes  [place], [place] [phone]

## 2018-06-19 ENCOUNTER — Other Ambulatory Visit: Payer: Self-pay | Admitting: Family Medicine

## 2018-06-19 DIAGNOSIS — I10 Essential (primary) hypertension: Secondary | ICD-10-CM

## 2018-07-06 ENCOUNTER — Encounter: Payer: Self-pay | Admitting: Family Medicine

## 2018-07-09 ENCOUNTER — Encounter: Payer: Self-pay | Admitting: Family Medicine

## 2018-07-09 ENCOUNTER — Telehealth: Payer: Self-pay | Admitting: Family Medicine

## 2018-07-11 ENCOUNTER — Encounter: Payer: Self-pay | Admitting: Family Medicine

## 2018-07-12 NOTE — Telephone Encounter (Signed)
Patient aware we can not fill controlled substances outside of an appointment. Patient given an appointment for 07/14/2018.

## 2018-07-13 ENCOUNTER — Telehealth: Payer: Self-pay | Admitting: Family Medicine

## 2018-07-13 NOTE — Telephone Encounter (Signed)
Apt scheduled.  

## 2018-07-13 NOTE — Telephone Encounter (Signed)
It cannot be a televisit because she needs a controlled substance that I have never prescribed.  She has to be seen in office.

## 2018-07-14 ENCOUNTER — Ambulatory Visit: Payer: BC Managed Care – PPO | Admitting: Family Medicine

## 2018-07-16 ENCOUNTER — Other Ambulatory Visit: Payer: Self-pay

## 2018-07-19 ENCOUNTER — Encounter: Payer: Self-pay | Admitting: Family Medicine

## 2018-07-19 ENCOUNTER — Ambulatory Visit (INDEPENDENT_AMBULATORY_CARE_PROVIDER_SITE_OTHER): Payer: BC Managed Care – PPO | Admitting: Family Medicine

## 2018-07-19 ENCOUNTER — Other Ambulatory Visit: Payer: Self-pay

## 2018-07-19 VITALS — BP 138/71 | HR 80 | Ht 65.0 in | Wt 276.0 lb

## 2018-07-19 DIAGNOSIS — F5104 Psychophysiologic insomnia: Secondary | ICD-10-CM

## 2018-07-19 DIAGNOSIS — F411 Generalized anxiety disorder: Secondary | ICD-10-CM

## 2018-07-19 DIAGNOSIS — F339 Major depressive disorder, recurrent, unspecified: Secondary | ICD-10-CM

## 2018-07-19 MED ORDER — ALPRAZOLAM 0.5 MG PO TABS: 0.2500 mg | ORAL_TABLET | Freq: Every day | ORAL | 2 refills | Status: DC | PRN

## 2018-07-19 NOTE — Patient Instructions (Signed)
We discussed Trazodone as a potential for sleep but your Viibryd interacts with it.  I have given you a few extra tablets of the Xanax.  We discussed the addictive potential and life threatening side effects of this medication.  We can consider Ambien or Belsomra at some point for sleep but you would have to come off the Xanax.  Additionally, your Buspirone tablets are written such that you can take 2 tablets twice daily for anxiety.  Please try increasing to 2 tablets twice daily for better control of your anxiety.  If your symptoms continue to be uncontrolled, you may need to consider referral to psychiatry for assistance with medications.

## 2018-07-19 NOTE — Progress Notes (Signed)
Subjective: CC: Generalized anxiety disorder PCP: [name], DO PTW:SFKCL Sherri Howard is a 63 y.o. female presenting to clinic today for:  1.  Generalized anxiety disorder Patient here for interval follow-up of generalized anxiety disorder.  She notes that she has quite a bit of anxiety and panic during the day.  She does not always take Xanax every day but sometimes will need to take up to 2 daily.  She is asking for increased quantity of this as a result of increased need.  She does report compliance with buspirone twice daily but thinks that she is only taking 1 tablet twice a day.  She also takes Viibryd, which is done the best for her thus far.  While is expensive, she does not want to discontinue this medicine.  She does report chronic diarrhea has not noticed a great deal of increased diarrhea with the BuSpar.  She does report difficulty with sleep.  She is wanting to know if there is a sleep medication that we could potentially start for her.   ROS: Per HPI  Allergies  Allergen Reactions  . Ciprofloxacin Rash  . Clindamycin Rash    Reaction unknown  . Influenza Vaccines Rash  . Latex Rash  . Other Rash    Bandages,etc.  . Sulfur Rash    Duplicate   . Temazepam Rash  . Enablex [Darifenacin Hydrobromide Er] Swelling    Causes swelling of patient's tongue  . Hydroxyzine Hcl     Anesthetic when had tonsillectomy. Caused convulsions.  . Prednisolone Other (See Comments)    Convulsions per pt  . Sulfa Antibiotics   . Amoxicillin Diarrhea  . Oxycodone-Acetaminophen Rash  . Pneumococcal Vaccine Swelling and Rash    Lymph node swelling   Past Medical History:  Diagnosis Date  . Anxiety   . Depression   . Hypertension   . Kidney stones     Current Outpatient Medications:  .  acetaminophen (TYLENOL) 325 MG tablet, Take 650 mg by mouth every 6 (six) hours as needed for mild pain or moderate pain. , Disp: , Rfl:  .  ALPRAZolam (XANAX) 0.5 MG tablet, Take 1 tablet  (0.5 mg total) by mouth daily as needed for anxiety. Needs to be seen for more refills., Disp: 90 tablet, Rfl: 0 .  busPIRone (BUSPAR) 10 MG tablet, Take 2 tablets (20 mg total) by mouth 2 (two) times daily., Disp: 360 tablet, Rfl: 1 .  diltiazem (CARTIA XT) 240 MG 24 hr capsule, Take 1 capsule (240 mg total) by mouth daily., Disp: 90 capsule, Rfl: 2 .  HYDROcodone-acetaminophen (NORCO) 5-325 MG tablet, Take 1 tablet by mouth every 6 (six) hours as needed for moderate pain., Disp: 30 tablet, Rfl: 0 .  losartan (COZAAR) 100 MG tablet, TAKE 1 TABLET DAILY, Disp: 90 tablet, Rfl: 3 .  pantoprazole (PROTONIX) 40 MG tablet, Take 1 tablet (40 mg total) by mouth daily., Disp: 30 tablet, Rfl: 3 .  VIIBRYD 40 MG TABS, Take 1 tablet (40 mg total) by mouth daily., Disp: 90 tablet, Rfl: 1 Social History   Socioeconomic History  . Marital status: Married    Spouse name: Not on file  . Number of children: Not on file  . Years of education: Not on file  . Highest education level: Not on file  Occupational History  . Not on file  Social Needs  . Financial resource strain: Not on file  . Food insecurity    Worry: Not on file  Inability: Not on file  . Transportation needs    Medical: Not on file    Non-medical: Not on file  Tobacco Use  . Smoking status: Never Smoker  . Smokeless tobacco: Never Used  Substance and Sexual Activity  . Alcohol use: No  . Drug use: No  . Sexual activity: Not on file  Lifestyle  . Physical activity    Days per week: Not on file    Minutes per session: Not on file  . Stress: Not on file  Relationships  . Social Herbalist on phone: Not on file    Gets together: Not on file    Attends religious service: Not on file    Active member of club or organization: Not on file    Attends meetings of clubs or organizations: Not on file    Relationship status: Not on file  . Intimate partner violence    Fear of current or ex partner: Not on file    Emotionally  abused: Not on file    Physically abused: Not on file    Forced sexual activity: Not on file  Other Topics Concern  . Not on file  Social History Narrative  . Not on file   Family History  Problem Relation Age of Onset  . Cancer Mother   . Aneurysm Mother   . Cancer Father   . Hypertension Father   . Alzheimer's disease Father     Objective: Office vital signs reviewed. BP 138/71   Pulse 80   Ht 5\' 5"  (1.651 m)   Wt 276 lb (125.2 kg)   BMI 45.93 kg/m   Physical Examination:  General: Awake, alert, obese, No acute distress HEENT: Normal, sclera white Psych: mood stable, speech normal, affect flat. Does not seem to be responding to internal stimuli.  Depression screen [facility] 2/9 07/19/2018 05/04/2018 02/22/2018  Decreased Interest 1 2 0  Down, Depressed, Hopeless 1 1 0  PHQ - 2 Score 2 3 0  Altered sleeping 1 2 -  Tired, decreased energy 1 2 -  Change in appetite 0 1 -  Feeling bad or failure about yourself  0 1 -  Trouble concentrating 0 2 -  Moving slowly or fidgety/restless 0 1 -  Suicidal thoughts - 0 -  PHQ-9 Score 4 12 -  Difficult doing work/chores - Not difficult at all -  Some recent data might be hidden   GAD 7 : Generalized Anxiety Score 05/04/2018 10/27/2016 05/15/2016 09/20/2015  Nervous, Anxious, on Edge 1 3 2 2   Control/stop worrying 1 3 2 2   Worry too much - different things 1 3 2 2   Trouble relaxing 1 3 2 3   Restless 2 1 0 2  Easily annoyed or irritable 1 1 0 1  Afraid - awful might happen 0 0 0 1  Total GAD 7 Score 7 14 8 13   Anxiety Difficulty Somewhat difficult Somewhat difficult Somewhat difficult Somewhat difficult   Assessment/ Plan: 63 y.o. female   1. Generalized anxiety disorder Not controlled.  We discussed the risks of benzodiazepine use and ultimate need for titration of the medication, particularly as she approaches geriatric age.  At this time, she is very reluctant to discontinue benzo.  I advised her to increase Buspar to 20mg  BID.  The  Narcotic Database has been reviewed.  There were no red flags.  Controlled substance contract signed. - ALPRAZolam (XANAX) 0.5 MG tablet; Take 0.5-1 tablets (0.25-0.5 mg total) by mouth  daily as needed for anxiety. May take extra tablet during the day prn panic.  Dispense: 45 tablet; Refill: 2  2. Psychophysiological insomnia Discussed Trazodone but I think that the risk of Serotonin syndrome with concomitant use of Viibryd outweighs the benefit at this time. Could consider Belsomra at some point but patient would need to come off benzo first.  Lastly, could consider referral to psych for medication recommendations. - ALPRAZolam (XANAX) 0.5 MG tablet; Take 0.5-1 tablets (0.25-0.5 mg total) by mouth daily as needed for anxiety. May take extra tablet during the day prn panic.  Dispense: 45 tablet; Refill: 2  3. Depression, recurrent ([place]) As above.   No orders of the defined types were placed in this encounter.  No orders of the defined types were placed in this encounter.    [name], DO [place] [phone]

## 2018-07-20 ENCOUNTER — Ambulatory Visit: Payer: BC Managed Care – PPO | Admitting: Family Medicine

## 2018-08-09 ENCOUNTER — Encounter: Payer: Self-pay | Admitting: Family Medicine

## 2018-09-01 ENCOUNTER — Other Ambulatory Visit: Payer: Self-pay | Admitting: Family Medicine

## 2018-09-01 DIAGNOSIS — R1013 Epigastric pain: Secondary | ICD-10-CM

## 2018-09-15 ENCOUNTER — Other Ambulatory Visit: Payer: Self-pay | Admitting: *Deleted

## 2018-09-15 DIAGNOSIS — R1013 Epigastric pain: Secondary | ICD-10-CM

## 2018-09-15 MED ORDER — PANTOPRAZOLE SODIUM 40 MG PO TBEC: DELAYED_RELEASE_TABLET | ORAL | 1 refills | Status: DC

## 2018-09-24 ENCOUNTER — Other Ambulatory Visit: Payer: Self-pay | Admitting: Family Medicine

## 2018-09-24 DIAGNOSIS — F411 Generalized anxiety disorder: Secondary | ICD-10-CM

## 2018-09-24 DIAGNOSIS — F5104 Psychophysiologic insomnia: Secondary | ICD-10-CM

## 2018-10-06 ENCOUNTER — Encounter: Payer: Self-pay | Admitting: Family Medicine

## 2018-10-06 ENCOUNTER — Other Ambulatory Visit: Payer: Self-pay | Admitting: Family Medicine

## 2018-10-06 DIAGNOSIS — F411 Generalized anxiety disorder: Secondary | ICD-10-CM

## 2018-10-06 MED ORDER — BUSPIRONE HCL 10 MG PO TABS: 20.0000 mg | ORAL_TABLET | Freq: Every day | ORAL | 1 refills | Status: DC

## 2018-10-19 ENCOUNTER — Encounter: Payer: Self-pay | Admitting: Family Medicine

## 2018-10-20 ENCOUNTER — Other Ambulatory Visit: Payer: Self-pay

## 2018-10-20 DIAGNOSIS — I10 Essential (primary) hypertension: Secondary | ICD-10-CM

## 2018-10-20 MED ORDER — DILTIAZEM HCL ER COATED BEADS 240 MG PO CP24: 240.0000 mg | ORAL_CAPSULE | Freq: Every day | ORAL | 0 refills | Status: DC

## 2018-10-22 ENCOUNTER — Encounter: Payer: Self-pay | Admitting: Family Medicine

## 2018-10-22 ENCOUNTER — Other Ambulatory Visit: Payer: Self-pay

## 2018-10-22 ENCOUNTER — Ambulatory Visit (INDEPENDENT_AMBULATORY_CARE_PROVIDER_SITE_OTHER): Payer: BC Managed Care – PPO | Admitting: Family Medicine

## 2018-10-22 VITALS — BP 154/83 | HR 80 | Temp 98.9°F | Resp 16 | Ht 65.0 in | Wt 287.0 lb

## 2018-10-22 DIAGNOSIS — R350 Frequency of micturition: Secondary | ICD-10-CM | POA: Diagnosis not present

## 2018-10-22 LAB — URINALYSIS
Bilirubin, UA: NEGATIVE
Glucose, UA: NEGATIVE
Ketones, UA: NEGATIVE
Nitrite, UA: NEGATIVE
Protein,UA: NEGATIVE
Specific Gravity, UA: 1.015 (ref 1.005–1.030)
Urobilinogen, Ur: 0.2 mg/dL (ref 0.2–1.0)
pH, UA: 7 (ref 5.0–7.5)

## 2018-10-22 MED ORDER — TRIAMCINOLONE ACETONIDE 0.1 % EX CREA: 1.0000 "application " | TOPICAL_CREAM | Freq: Three times a day (TID) | CUTANEOUS | 0 refills | Status: DC

## 2018-10-22 MED ORDER — DOXYCYCLINE HYCLATE 100 MG PO CAPS: 100.0000 mg | ORAL_CAPSULE | Freq: Two times a day (BID) | ORAL | 0 refills | Status: DC

## 2018-10-22 NOTE — Progress Notes (Signed)
Chief Complaint  Patient presents with  . Urinary Frequency    HPI  Patient presents today forurinary frequency for several weeks. Increasing nocturia. Had to go every hour last night.Gets urgency as well. Denies fever . No flank pain. No nausea, vomiting. Also sore on legs from scratching an itching area.   PMH: Smoking status noted ROS: Per HPI  Objective: Ht 5\' 5"  (1.651 m)   Wt 287 lb (130.2 kg)   BMI 47.76 kg/m  Gen: NAD, alert, cooperative with exam HEENT: NCAT, EOMI, PERRL CV: RRR, good S1/S2, no murmur Resp: CTABL, no wheezes, non-labored Abd: SNTND, BS present, no guarding or organomegaly Ext: No edema, warm. 1.5 cm superficial ulceration at left medial malleolus.    Neuro: Alert and oriented, No gross deficits  Assessment and plan:  No diagnosis found.  No orders of the defined types were placed in this encounter.   No orders of the defined types were placed in this encounter.   Follow up as needed.  Claretta Fraise, MD

## 2018-10-24 LAB — URINE CULTURE

## 2018-10-25 ENCOUNTER — Encounter: Payer: Self-pay | Admitting: Family Medicine

## 2018-10-25 ENCOUNTER — Other Ambulatory Visit: Payer: Self-pay | Admitting: Family Medicine

## 2018-10-25 ENCOUNTER — Telehealth: Payer: Self-pay | Admitting: Family Medicine

## 2018-10-25 DIAGNOSIS — R350 Frequency of micturition: Secondary | ICD-10-CM

## 2018-10-25 MED ORDER — CEFDINIR 300 MG PO CAPS: 300.0000 mg | ORAL_CAPSULE | Freq: Two times a day (BID) | ORAL | 0 refills | Status: DC

## 2018-10-25 MED ORDER — PREDNISONE 10 MG PO TABS: 10.0000 mg | ORAL_TABLET | Freq: Every day | ORAL | 0 refills | Status: DC

## 2018-10-25 MED ORDER — CIPROFLOXACIN HCL 500 MG PO TABS: 500.0000 mg | ORAL_TABLET | Freq: Two times a day (BID) | ORAL | 0 refills | Status: DC

## 2018-10-25 NOTE — Telephone Encounter (Signed)
If patient is concerned about the Cipro it looks like Ceftin ear should cover well, have her take that, it is not on her allergy list but it will give her diarrhea it is a potent antibiotic but have her finish the full course if she does not want the infection to continue to worsen and cause more back pain

## 2018-10-25 NOTE — Telephone Encounter (Signed)
Pt aware of recommendation, or Dr. Warrick Parisian also stated that pt can get a shot of Rocephin

## 2018-10-26 ENCOUNTER — Telehealth: Payer: Self-pay | Admitting: Family Medicine

## 2018-10-26 ENCOUNTER — Encounter: Payer: Self-pay | Admitting: Family Medicine

## 2018-10-26 NOTE — Telephone Encounter (Signed)
No. Dr Dettinger called in [place] yesterday.  She should be taking that.

## 2018-10-26 NOTE — Telephone Encounter (Signed)
Is this ok to do? Please advise

## 2018-10-27 ENCOUNTER — Ambulatory Visit (INDEPENDENT_AMBULATORY_CARE_PROVIDER_SITE_OTHER): Payer: BC Managed Care – PPO | Admitting: Family Medicine

## 2018-10-27 ENCOUNTER — Other Ambulatory Visit: Payer: Self-pay | Admitting: Family Medicine

## 2018-10-27 DIAGNOSIS — L299 Pruritus, unspecified: Secondary | ICD-10-CM | POA: Diagnosis not present

## 2018-10-27 NOTE — Telephone Encounter (Signed)
Called patient

## 2018-10-27 NOTE — Progress Notes (Signed)
Telephone visit  Subjective: [name] itching PCP: [name], DO [name] Sherri Howard is a 63 y.o. female calls for telephone consult today. Patient provides verbal consent for consult held via phone.  Location of patient: home Location of provider: [facility] Others present for call: none  1. Itchy feet Patient reports onset of itchy feet for several days now.  She feels like there is blisters underneath the skin.  She notes it is from ankles down.  She has been applying the triamcinolone as prescribed to the sore that she had and this does seem to be helping.  She is taking Benadryl several times per day and this does help some with the itching.  She is not on a 24-hour antihistamine.  She cannot tolerate prednisone due to convulsions in the past.   ROS: Per HPI  Allergies  Allergen Reactions  . Ciprofloxacin Rash  . Clindamycin Rash    Reaction unknown  . Influenza Vaccines Rash  . Latex Rash  . Other Rash    Bandages,etc.  . Sulfur Rash    Duplicate   . Temazepam Rash  . Enablex [Darifenacin Hydrobromide Er] Swelling    Causes swelling of patient's tongue  . Hydroxyzine Hcl     Anesthetic when had tonsillectomy. Caused convulsions.  . Prednisolone Other (See Comments)    Convulsions per pt  . Sulfa Antibiotics   . Amoxicillin Diarrhea  . Oxycodone-Acetaminophen Rash  . Pneumococcal Vaccine Swelling and Rash    Lymph node swelling   Past Medical History:  Diagnosis Date  . Anxiety   . Depression   . Hypertension   . Kidney stones     Current Outpatient Medications:  .  acetaminophen (TYLENOL) 325 MG tablet, Take 650 mg by mouth every 6 (six) hours as needed for mild pain or moderate pain. , Disp: , Rfl:  .  ALPRAZolam (XANAX) 0.5 MG tablet, Take 0.5-1 tablets (0.25-0.5 mg total) by mouth daily as needed for anxiety. May take extra tablet during the day prn panic., Disp: 45 tablet, Rfl: 2 .  busPIRone (BUSPAR) 10 MG tablet, Take 2 tablets (20 mg total) by mouth  daily., Disp: 360 tablet, Rfl: 1 .  cefdinir (OMNICEF) 300 MG capsule, Take 1 capsule (300 mg total) by mouth 2 (two) times daily. 1 po BID, Disp: 20 capsule, Rfl: 0 .  ciprofloxacin (CIPRO) 500 MG tablet, Take 1 tablet (500 mg total) by mouth 2 (two) times daily., Disp: 20 tablet, Rfl: 0 .  diltiazem (CARTIA XT) 240 MG 24 hr capsule, Take 1 capsule (240 mg total) by mouth daily., Disp: 30 capsule, Rfl: 0 .  losartan (COZAAR) 100 MG tablet, TAKE 1 TABLET DAILY, Disp: 90 tablet, Rfl: 3 .  predniSONE (DELTASONE) 10 MG tablet, Take 1 tablet (10 mg total) by mouth daily with breakfast., Disp: 14 tablet, Rfl: 0 .  triamcinolone cream (KENALOG) 0.1 %, Apply 1 application topically 3 (three) times daily. Avoid face and genitalia, Disp: 45 g, Rfl: 0 .  VIIBRYD 40 MG TABS, Take 1 tablet (40 mg total) by mouth daily., Disp: 90 tablet, Rfl: 1  Assessment/ Plan: 63 y.o. female   1. Pruritus Advised to use either Zyrtec or Claritin daily.  May use Benadryl every 6 hours if needed in addition to this.  Unfortunately, she is unable to tolerate Atarax and so this was not prescribed.  She can also not take oral corticosteroids.  I advised her to continue the topical triamcinolone twice daily for up to 7  to 10 days.  If symptoms do not improve, we can consider advancing the potency of the steroid.  Okay to proceed with Omnicef.  She is tolerated cephalosporins without difficulty.   Start time: 11:44am End time: 11:54am  Total time spent on patient care (including telephone call/ virtual visit): 15 minutes  Sherri Howard, [place] [phone]

## 2018-10-27 NOTE — Patient Instructions (Signed)
Pruritus Pruritus is an itchy feeling on the skin. One of the most common causes is dry skin, but many different things can cause itching. Most cases of itching do not require medical attention. Sometimes itchy skin can turn into a rash. Follow these instructions at home: Skin care   Apply moisturizing lotion to your skin as needed. Lotion that contains petroleum jelly is best.  Take medicines or apply medicated creams only as told by your health care provider. This may include: ? Corticosteroid cream. ? Anti-itch lotions. ? Oral antihistamines.  Apply a cool, wet cloth (cool compress) to the affected areas.  Take baths with one of the following: ? Epsom salts. You can get these at your local pharmacy or grocery store. Follow the instructions on the packaging. ? Baking soda. Pour a small amount into the bath as told by your health care provider. ? Colloidal oatmeal. You can get this at your local pharmacy or grocery store. Follow the instructions on the packaging.  Apply baking soda paste to your skin. To make the paste, stir water into a small amount of baking soda until it reaches a paste-like consistency.  Do not scratch your skin.  Do not take hot showers or baths, which can make itching worse. A cool shower may help with itching as long as you apply moisturizing lotion after the shower.  Do not use scented soaps, detergents, perfumes, and cosmetic products. Instead, use gentle, unscented versions of these items. General instructions  Avoid wearing tight clothes.  Keep a journal to help find out what is causing your itching. Write down: ? What you eat and drink. ? What cosmetic products you use. ? What soaps or detergents you use. ? What you wear, including jewelry.  Use a humidifier. This keeps the air moist, which helps to prevent dry skin.  Be aware of any changes in your itchiness. Contact a health care provider if:  The itching does not go away after several days.   You are unusually thirsty or urinating more than normal.  Your skin tingles or feels numb.  Your skin or the white parts of your eyes turn yellow (jaundice).  You feel weak.  You have any of the following: ? Night sweats. ? Tiredness (fatigue). ? Weight loss. ? Abdominal pain. Summary  Pruritus is an itchy feeling on the skin. One of the most common causes is dry skin, but many different conditions and factors can cause itching.  Apply moisturizing lotion to your skin as needed. Lotion that contains petroleum jelly is best.  Take medicines or apply medicated creams only as told by your health care provider.  Do not take hot showers or baths. Do not use scented soaps, detergents, perfumes, or cosmetic products. This information is not intended to replace advice given to you by your health care provider. Make sure you discuss any questions you have with your health care provider. Document Released: 09/11/2010 Document Revised: 01/13/2017 Document Reviewed: 01/13/2017 Elsevier Patient Education  2020 [employer]  

## 2018-10-27 NOTE — Telephone Encounter (Signed)
Patient advised that cefdinir was called in on the 12th. Patient will try this antibiotic.

## 2018-11-01 ENCOUNTER — Encounter: Payer: Self-pay | Admitting: Family Medicine

## 2018-11-01 MED ORDER — BETAMETHASONE VALERATE 0.1 % EX OINT: 1.0000 "application " | TOPICAL_OINTMENT | Freq: Two times a day (BID) | CUTANEOUS | 0 refills | Status: DC

## 2018-11-06 ENCOUNTER — Other Ambulatory Visit: Payer: Self-pay | Admitting: Physician Assistant

## 2018-11-06 DIAGNOSIS — R350 Frequency of micturition: Secondary | ICD-10-CM

## 2018-11-06 MED ORDER — CEFDINIR 300 MG PO CAPS: 300.0000 mg | ORAL_CAPSULE | Freq: Two times a day (BID) | ORAL | 0 refills | Status: DC

## 2018-11-06 NOTE — Progress Notes (Signed)
   In reviewing the chart the patient has had some urinary tract infection.  The culture was positive for pseudoillness.  She was usually on Cipro, but developed a rash.  She was changed over to cefdinir.  She has completed that medication.  She states that most of the symptoms are gone but last night when she voided she had blood in her urine.  It was not a copious amount.  She did not have any today that she notices.  She denies any fever or chills.  1 we will refill the cefdinir for now.  I have instructed the patient to try to have an appointment in the next 10 days to follow-up on this urinary tract infection.  She also reports that her blood pressures have been slightly elevated.  And she can keep a log of this and bring it with her when she comes for her appointment with Dr. Lajuana Ripple.  Someone had discussed an option of her having IV antibiotics, due to how many think she is allergic to.  I will let her discuss this with her PCP.  Terald Sleeper PA-C [place] [address]  [place], [place] [address] [phone]

## 2018-11-22 NOTE — Progress Notes (Signed)
Pt has appt to recheck urine and other issues on 11/24/18

## 2018-11-24 ENCOUNTER — Other Ambulatory Visit: Payer: Self-pay

## 2018-11-25 ENCOUNTER — Encounter: Payer: Self-pay | Admitting: Family Medicine

## 2018-11-25 ENCOUNTER — Ambulatory Visit (INDEPENDENT_AMBULATORY_CARE_PROVIDER_SITE_OTHER): Payer: BC Managed Care – PPO | Admitting: Family Medicine

## 2018-11-25 VITALS — BP 142/76 | HR 82 | Temp 98.6°F | Ht 65.0 in | Wt 285.0 lb

## 2018-11-25 DIAGNOSIS — N3090 Cystitis, unspecified without hematuria: Secondary | ICD-10-CM | POA: Diagnosis not present

## 2018-11-25 DIAGNOSIS — I10 Essential (primary) hypertension: Secondary | ICD-10-CM | POA: Diagnosis not present

## 2018-11-25 DIAGNOSIS — R21 Rash and other nonspecific skin eruption: Secondary | ICD-10-CM

## 2018-11-25 DIAGNOSIS — F411 Generalized anxiety disorder: Secondary | ICD-10-CM

## 2018-11-25 DIAGNOSIS — F5104 Psychophysiologic insomnia: Secondary | ICD-10-CM | POA: Diagnosis not present

## 2018-11-25 LAB — URINALYSIS, COMPLETE
Bilirubin, UA: NEGATIVE
Glucose, UA: NEGATIVE
Ketones, UA: NEGATIVE
Nitrite, UA: NEGATIVE
Protein,UA: NEGATIVE
Specific Gravity, UA: 1.025 (ref 1.005–1.030)
Urobilinogen, Ur: 0.2 mg/dL (ref 0.2–1.0)
pH, UA: 6 (ref 5.0–7.5)

## 2018-11-25 LAB — MICROSCOPIC EXAMINATION: WBC, UA: >30 /hpf — AB (ref 0–5)

## 2018-11-25 MED ORDER — CEPHALEXIN 500 MG PO CAPS: 500.0000 mg | ORAL_CAPSULE | Freq: Two times a day (BID) | ORAL | 0 refills | Status: AC

## 2018-11-25 MED ORDER — DILTIAZEM HCL ER COATED BEADS 240 MG PO CP24: 240.0000 mg | ORAL_CAPSULE | Freq: Every day | ORAL | 3 refills | Status: DC

## 2018-11-25 MED ORDER — ALPRAZOLAM 0.5 MG PO TABS: 0.2500 mg | ORAL_TABLET | Freq: Every day | ORAL | 2 refills | Status: DC | PRN

## 2018-11-25 MED ORDER — BUSPIRONE HCL 10 MG PO TABS: 20.0000 mg | ORAL_TABLET | Freq: Every day | ORAL | 1 refills | Status: DC

## 2018-11-25 MED ORDER — VIIBRYD 40 MG PO TABS: 40.0000 mg | ORAL_TABLET | Freq: Every day | ORAL | 1 refills | Status: DC

## 2018-11-25 MED ORDER — CEFTRIAXONE SODIUM 1 G IJ SOLR
1.0000 g | Freq: Once | INTRAMUSCULAR | Status: AC
  Administered 2018-11-25: 1 g via INTRAMUSCULAR

## 2018-11-25 NOTE — Progress Notes (Signed)
Subjective: CC: Follow-up urinary tract infection, rash, anxiety PCP: [name], DO [name] Sherri Howard is a 63 y.o. female presenting to clinic today for:  1.  Urinary tract infection Patient with recurrent urinary tract infection.  She brings urine sample in today to recheck urine.  She has ongoing urinary urgency and frequency.  She is status post treatment with Omnicef in October.  She has a history of renal stones which required lithotripsy, surgical removal and stent previously.  She cannot remember who her urologist was but notes that he has since moved out of the area.  She has not seen a urologist locally.  No nausea, vomiting, fevers or new back pain.  2.  Rash Patient reports ongoing lower extremity rash.  She has a couple of lesions along the stomach and arms.  Denies any insects biting her.  No family members with similar.  She notes that the rash initially appears to have a little bit of fluid within the lesions but that she scratches them.  She has been treated with oral antihistamines and is currently taking a 24-hour antihistamine in the morning and then following it with Benadryl at nighttime.  Unfortunately, she is unable to take oral steroids secondary to convulsions as a side effect previously.  She was prescribed betamethasone topically in mid March but notes that she never used it.  No known tick bites.  No intolerance to meats.  No new topicals including lotions, soaps, detergents.  3.  Anxiety disorder Patient reports compliance with Viibryd, BuSpar.  She uses alprazolam intermittently, perhaps a few days per week.  Some days she does not use it all but some days she uses it a couple of times in a day.  Denies excessive sedation, memory loss, falls.  ROS: Per HPI  Allergies  Allergen Reactions  . Ciprofloxacin Rash  . Clindamycin Rash    Reaction unknown  . Influenza Vaccines Rash  . Latex Rash  . Other Rash    Bandages,etc.  . Sulfur Rash    Duplicate   . Temazepam Rash  . Enablex [Darifenacin Hydrobromide Er] Swelling    Causes swelling of patient's tongue  . Hydroxyzine Hcl     Anesthetic when had tonsillectomy. Caused convulsions.  . Prednisolone Other (See Comments)    Convulsions per pt  . Sulfa Antibiotics   . Amoxicillin Diarrhea  . Oxycodone-Acetaminophen Rash  . Pneumococcal Vaccine Swelling and Rash    Lymph node swelling   Past Medical History:  Diagnosis Date  . Anxiety   . Depression   . Hypertension   . Kidney stones     Current Outpatient Medications:  .  acetaminophen (TYLENOL) 325 MG tablet, Take 650 mg by mouth every 6 (six) hours as needed for mild pain or moderate pain. , Disp: , Rfl:  .  ALPRAZolam (XANAX) 0.5 MG tablet, Take 0.5-1 tablets (0.25-0.5 mg total) by mouth daily as needed for anxiety. May take extra tablet during the day prn panic., Disp: 45 tablet, Rfl: 2 .  betamethasone valerate ointment (VALISONE) 0.1 %, Apply 1 application topically 2 (two) times daily., Disp: 30 g, Rfl: 0 .  busPIRone (BUSPAR) 10 MG tablet, Take 2 tablets (20 mg total) by mouth daily., Disp: 360 tablet, Rfl: 1 .  cetirizine (ZYRTEC) 10 MG chewable tablet, Chew 10 mg by mouth daily., Disp: , Rfl:  .  diltiazem (CARTIA XT) 240 MG 24 hr capsule, Take 1 capsule (240 mg total) by mouth daily., Disp: 30 capsule,  Rfl: 0 .  losartan (COZAAR) 100 MG tablet, TAKE 1 TABLET DAILY, Disp: 90 tablet, Rfl: 3 .  triamcinolone cream (KENALOG) 0.1 %, Apply 1 application topically 3 (three) times daily. Avoid face and genitalia, Disp: 45 g, Rfl: 0 .  VIIBRYD 40 MG TABS, Take 1 tablet (40 mg total) by mouth daily., Disp: 90 tablet, Rfl: 1 Social History   Socioeconomic History  . Marital status: Married    Spouse name: Not on file  . Number of children: Not on file  . Years of education: Not on file  . Highest education level: Not on file  Occupational History  . Not on file  Social Needs  . Financial resource strain: Not on file  .  Food insecurity    Worry: Not on file    Inability: Not on file  . Transportation needs    Medical: Not on file    Non-medical: Not on file  Tobacco Use  . Smoking status: Never Smoker  . Smokeless tobacco: Never Used  Substance and Sexual Activity  . Alcohol use: No  . Drug use: No  . Sexual activity: Not on file  Lifestyle  . Physical activity    Days per week: Not on file    Minutes per session: Not on file  . Stress: Not on file  Relationships  . Social Herbalist on phone: Not on file    Gets together: Not on file    Attends religious service: Not on file    Active member of club or organization: Not on file    Attends meetings of clubs or organizations: Not on file    Relationship status: Not on file  . Intimate partner violence    Fear of current or ex partner: Not on file    Emotionally abused: Not on file    Physically abused: Not on file    Forced sexual activity: Not on file  Other Topics Concern  . Not on file  Social History Narrative  . Not on file   Family History  Problem Relation Age of Onset  . Cancer Mother   . Aneurysm Mother   . Cancer Father   . Hypertension Father   . Alzheimer's disease Father     Objective: Office vital signs reviewed. BP (!) 142/76   Pulse 82   Temp 98.6 F (37 C) (Temporal)   Ht 5\' 5"  (1.651 m)   Wt 285 lb (129.3 kg)   BMI 47.43 kg/m   Physical Examination:  General: Awake, alert, obese, No acute distress Cardio: regular rate and rhythm, S1S2 heard, no murmurs appreciated Pulm: clear to auscultation bilaterally, no wheezes, rhonchi or rales; normal work of breathing on room air GU: no CVA TTP Extremities: warm, well perfused, No edema, cyanosis or clubbing; +2 pulses bilaterally MSK: normal gait and station Skin: Several excoriated lesions that appear to have been maculopapular in nature along bilateral ankles and lower extremities.  No evidence of secondary infection.  No active pustules or vesicles  appreciated  Assessment/ Plan: 63 y.o. female   1. Recurrent cystitis Her urinalysis today with greater than 30 white blood cells, 3-10 red blood cells and moderate amounts of bacteria.  I sent this for culture.  Have given her a dose of Rocephin and prescribed her Keflex twice daily for 7 days.  Her urine culture from October 9 showed pansensitive Pseudomonas.  She unfortunately has severe allergy to Cipro and sulfa.  Therefore we will treat  with Rocephin and Keflex as above.  I have placed a referral to urology for further evaluation. - urinalysis- dip and micro - Urine culture - cefTRIAXone (ROCEPHIN) injection 1 g - cephALEXin (KEFLEX) 500 MG capsule; Take 1 capsule (500 mg total) by mouth 2 (two) times daily for 7 days.  Dispense: 14 capsule; Refill: 0 - Ambulatory referral to Urology  2. Generalized anxiety disorder Stable.  The national narcotic database was reviewed and there were no red flags.  Renewals of medicine sent - ALPRAZolam (XANAX) 0.5 MG tablet; Take 0.5-1 tablets (0.25-0.5 mg total) by mouth daily as needed for anxiety. May take extra tablet during the day prn panic.  Dispense: 45 tablet; Refill: 2 - busPIRone (BUSPAR) 10 MG tablet; Take 2 tablets (20 mg total) by mouth daily.  Dispense: 360 tablet; Refill: 1 - VIIBRYD 40 MG TABS; Take 1 tablet (40 mg total) by mouth daily.  Dispense: 90 tablet; Refill: 1  3. Psychophysiological insomnia - ALPRAZolam (XANAX) 0.5 MG tablet; Take 0.5-1 tablets (0.25-0.5 mg total) by mouth daily as needed for anxiety. May take extra tablet during the day prn panic.  Dispense: 45 tablet; Refill: 2  4. Essential hypertension Controlled - diltiazem (CARTIA XT) 240 MG 24 hr capsule; Take 1 capsule (240 mg total) by mouth daily.  Dispense: 90 capsule; Refill: 3  5. Rash and nonspecific skin eruption Uncertain etiology.  It does not appear to be a typical scabies rash, as it does not involve the interdigital spaces.  It does not have a pattern  consistent with bedbugs.  It also does not appear to be a typical eczema rash.  I have advised her to continue the oral antihistamines and I placed a referral to dermatology for further evaluation and management. - Ambulatory referral to Dermatology   Orders Placed This Encounter  Procedures  . urinalysis- dip and micro   No orders of the defined types were placed in this encounter.    [name], DO [place] [phone]

## 2018-11-25 NOTE — Patient Instructions (Signed)
Continue antihistamines.  Referral placed to dermatology and urology today. Please send me the name of your old urologist and I will forward to the new one. You were given a dose of antibiotics today. Start the oral version tomorrow.

## 2018-11-26 ENCOUNTER — Other Ambulatory Visit: Payer: Self-pay | Admitting: Physician Assistant

## 2018-11-26 DIAGNOSIS — F411 Generalized anxiety disorder: Secondary | ICD-10-CM

## 2018-11-26 MED ORDER — BUSPIRONE HCL 10 MG PO TABS: 20.0000 mg | ORAL_TABLET | Freq: Every day | ORAL | 1 refills | Status: DC

## 2018-11-27 LAB — URINE CULTURE

## 2018-11-29 DIAGNOSIS — B86 Scabies: Secondary | ICD-10-CM | POA: Diagnosis not present

## 2019-01-03 ENCOUNTER — Other Ambulatory Visit: Payer: Self-pay | Admitting: Family Medicine

## 2019-01-03 DIAGNOSIS — I10 Essential (primary) hypertension: Secondary | ICD-10-CM

## 2019-01-25 ENCOUNTER — Other Ambulatory Visit: Payer: Self-pay | Admitting: Family Medicine

## 2019-01-25 DIAGNOSIS — F5104 Psychophysiologic insomnia: Secondary | ICD-10-CM

## 2019-01-25 DIAGNOSIS — F411 Generalized anxiety disorder: Secondary | ICD-10-CM

## 2019-03-07 ENCOUNTER — Telehealth: Payer: Self-pay | Admitting: Family Medicine

## 2019-03-07 NOTE — Telephone Encounter (Signed)
Patient reports episodes of swelling in her forehead and along bridge of nose ongoing for about the past month.  Unsure of what could be causing it.  No changes to make up, cleansers, etc.  Appointment scheduled with Hendricks Limes on 03/08/2019.

## 2019-03-08 ENCOUNTER — Ambulatory Visit (INDEPENDENT_AMBULATORY_CARE_PROVIDER_SITE_OTHER): Payer: BC Managed Care – PPO | Admitting: Family Medicine

## 2019-03-08 ENCOUNTER — Other Ambulatory Visit: Payer: Self-pay

## 2019-03-08 ENCOUNTER — Other Ambulatory Visit (HOSPITAL_COMMUNITY)
Admission: AD | Admit: 2019-03-08 | Discharge: 2019-03-08 | Disposition: A | Discharge status: Home / Self Care | Payer: BC Managed Care – PPO | Source: Other Acute Inpatient Hospital | Attending: Urology | Admitting: Urology

## 2019-03-08 ENCOUNTER — Encounter: Payer: Self-pay | Admitting: Family Medicine

## 2019-03-08 ENCOUNTER — Encounter: Payer: Self-pay | Admitting: Urology

## 2019-03-08 ENCOUNTER — Other Ambulatory Visit: Payer: Self-pay | Admitting: Urology

## 2019-03-08 ENCOUNTER — Ambulatory Visit (INDEPENDENT_AMBULATORY_CARE_PROVIDER_SITE_OTHER): Payer: BC Managed Care – PPO | Admitting: Urology

## 2019-03-08 VITALS — BP 133/85 | HR 99 | Temp 97.7°F | Ht 65.0 in | Wt 290.0 lb

## 2019-03-08 VITALS — BP 151/84 | HR 93 | Temp 99.1°F | Ht 65.0 in | Wt 295.4 lb

## 2019-03-08 DIAGNOSIS — L03211 Cellulitis of face: Secondary | ICD-10-CM | POA: Diagnosis not present

## 2019-03-08 DIAGNOSIS — Z87442 Personal history of urinary calculi: Secondary | ICD-10-CM

## 2019-03-08 DIAGNOSIS — R350 Frequency of micturition: Secondary | ICD-10-CM | POA: Diagnosis not present

## 2019-03-08 LAB — POCT URINALYSIS DIPSTICK
Bilirubin, UA: NEGATIVE
Glucose, UA: NEGATIVE
Nitrite, UA: NEGATIVE
Protein, UA: POSITIVE — AB
Spec Grav, UA: >=1.03 — AB (ref 1.010–1.025)
Urobilinogen, UA: NEGATIVE E.U./dL — AB
pH, UA: 5 (ref 5.0–8.0)

## 2019-03-08 LAB — BASIC METABOLIC PANEL WITHOUT GFR
BUN/Creatinine Ratio: 14 (calc) (ref 6–22)
BUN: 15 mg/dL (ref 7–25)
CO2: 26 mmol/L (ref 20–32)
Calcium: 9.6 mg/dL (ref 8.6–10.4)
Chloride: 106 mmol/L (ref 98–110)
Creat: 1.07 mg/dL — ABNORMAL HIGH (ref 0.50–0.99)
GFR, Est African American: 65 mL/min/1.73m2
GFR, Est Non African American: 56 mL/min/1.73m2 — ABNORMAL LOW
Glucose, Bld: 91 mg/dL (ref 65–139)
Potassium: 4.5 mmol/L (ref 3.5–5.3)
Sodium: 141 mmol/L (ref 135–146)

## 2019-03-08 MED ORDER — DOXYCYCLINE HYCLATE 100 MG PO TABS: 100.0000 mg | ORAL_TABLET | Freq: Two times a day (BID) | ORAL | 0 refills | Status: AC

## 2019-03-08 NOTE — Progress Notes (Signed)
Urological Symptom Review  Patient is experiencing the following symptoms: Frequent urination Hard to postpone urination Get up at night to urinate Leakage of urine Stream starts and stops Trouble starting stream   Review of Systems  Gastrointestinal (upper)  : Negative for upper GI symptoms  Gastrointestinal (lower) : Diarrhea  Constitutional : Fatigue  Skin: Skin rash/lesion Itching  Eyes: Negative for eye symptoms  Ear/Nose/Throat : Sore throat Sinus problems  Hematologic/Lymphatic: Negative for Hematologic/Lymphatic symptoms  Cardiovascular : Negative for cardiovascular symptoms  Respiratory : Shortness of breath  Endocrine: Negative for endocrine symptoms  Musculoskeletal: Back pain  Neurological: Negative for neurological symptoms  Psychologic: Depression Anxiety

## 2019-03-08 NOTE — Progress Notes (Signed)
Assessment & Plan:  1. Cellulitis of face - Education provided on cellulitis.  - doxycycline (VIBRA-TABS) 100 MG tablet; Take 1 tablet (100 mg total) by mouth 2 (two) times daily for 7 days. 1 po bid  Dispense: 14 tablet; Refill: 0   Follow up plan: Return if symptoms worsen or fail to improve.  Hendricks Limes, MSN, APRN, FNP-C [facility]  Subjective:   [name], female    DOB: [birth year], 64 y.o.   MRN: [id number]  HPI: Sherri Howard is a 64 y.o. female presenting on 03/08/2019 for Facial Swelling (on and off x 3 months - swelling from nose up.  Patient states no changes in products )  Patient reports swelling from her nose up through her forehead.  Reports this started with a itchy rash of her forehead a month ago.  She did start taking Zyrtec a week ago which has not helped with any of the swelling.  She reports the swelling has recently got worse yesterday.  Denies any pain with movement of her eyes or change in vision.   ROS: Negative unless specifically indicated above in HPI.   Relevant past medical history reviewed and updated as indicated.   Allergies and medications reviewed and updated.   Current Outpatient Medications:  .  acetaminophen (TYLENOL) 325 MG tablet, Take 650 mg by mouth every 6 (six) hours as needed for mild pain or moderate pain. , Disp: , Rfl:  .  ALPRAZolam (XANAX) 0.5 MG tablet, Take 0.5-1 tablets (0.25-0.5 mg total) by mouth daily as needed for anxiety. May take extra tablet during the day prn panic., Disp: 45 tablet, Rfl: 2 .  betamethasone valerate ointment (VALISONE) 0.1 %, Apply 1 application topically 2 (two) times daily., Disp: 30 g, Rfl: 0 .  busPIRone (BUSPAR) 10 MG tablet, Take 2 tablets (20 mg total) by mouth daily., Disp: 360 tablet, Rfl: 1 .  CARTIA XT 240 MG 24 hr capsule, TAKE 1 CAPSULE DAILY, Disp: 90 capsule, Rfl: 3 .  cetirizine (ZYRTEC) 10 MG chewable tablet, Chew 10 mg by mouth daily., Disp: ,  Rfl:  .  losartan (COZAAR) 100 MG tablet, TAKE 1 TABLET DAILY, Disp: 90 tablet, Rfl: 3 .  triamcinolone cream (KENALOG) 0.1 %, Apply 1 application topically 3 (three) times daily. Avoid face and genitalia, Disp: 45 g, Rfl: 0 .  VIIBRYD 40 MG TABS, Take 1 tablet (40 mg total) by mouth daily., Disp: 90 tablet, Rfl: 1 .  doxycycline (VIBRA-TABS) 100 MG tablet, Take 1 tablet (100 mg total) by mouth 2 (two) times daily for 7 days. 1 po bid, Disp: 14 tablet, Rfl: 0  Allergies  Allergen Reactions  . Ciprofloxacin Rash  . Clindamycin Rash    Reaction unknown  . Influenza Vaccines Rash  . Latex Rash  . Other Rash    Bandages,etc.  . Sulfur Rash    Duplicate   . Temazepam Rash  . Enablex [Darifenacin Hydrobromide Er] Swelling    Causes swelling of patient's tongue  . Hydroxyzine Hcl     Anesthetic when had tonsillectomy. Caused convulsions.  . Prednisolone Other (See Comments)    Convulsions per pt  . Sulfa Antibiotics   . Amoxicillin Diarrhea  . Oxycodone-Acetaminophen Rash  . Pneumococcal Vaccine Swelling and Rash    Lymph node swelling    Objective:   BP (!) 151/84   Pulse 93   Temp 99.1 F (37.3 C) (Temporal)   Ht 5\' 5"  (1.651 m)  Wt 295 lb 6.4 oz (134 kg)   SpO2 97%   BMI 49.16 kg/m    Physical Exam Vitals reviewed.  Constitutional:      General: She is not in acute distress.    Appearance: Normal appearance. She is morbidly obese. She is not ill-appearing, toxic-appearing or diaphoretic.  HENT:     Head: Normocephalic and atraumatic.  Eyes:     General: No scleral icterus.       Right eye: No discharge.        Left eye: No discharge.     Conjunctiva/sclera: Conjunctivae normal.  Cardiovascular:     Rate and Rhythm: Normal rate.  Pulmonary:     Effort: Pulmonary effort is normal. No respiratory distress.  Musculoskeletal:        General: Normal range of motion.     Cervical back: Normal range of motion.  Skin:    General: Skin is warm and dry.      Capillary Refill: Capillary refill takes less than 2 seconds.     Findings: Erythema (nose, forehead and around eyes) present.     Comments: Swelling of nose, forehead, and around eyes as well.   Neurological:     General: No focal deficit present.     Mental Status: She is alert and oriented to person, place, and time. Mental status is at baseline.  Psychiatric:        Mood and Affect: Mood normal.        Behavior: Behavior normal.        Thought Content: Thought content normal.        Judgment: Judgment normal.

## 2019-03-08 NOTE — Patient Instructions (Signed)

## 2019-03-08 NOTE — Progress Notes (Signed)
H&P  Chief Complaint: Acute Cystitis + Hx of Kidney Stones  History of Present Illness: Sherri Howard is a 64 y.o. year old female  2.23.2021: Here today referred by her primary care provider following recent urinary tract infection and to establish care given her hx of kidney stones. She reports that a few months ago she had an episode of increased urinary frequency w/ nocturia (small voided volumes) accompanied by a sense of incomplete emptying. She denies having had dysuria, blood per urine, or cloudy urine. She was initially concerned that these sx's could have been secondary to a passing stone. She presented to her PCP for evaluation and was diagnosed and treated for UTI. In the interim, she feels as though she is still having elevated urinary freq.  She reports that her last stone episode was at least a few years ago. She has had multiple stones that have required surgical interventions (ESWL, ureteroscopy, etc). She believes that she has been treated for around 4-5 stones in total. She has not been practicing any specific stone prevention diet. Today, she is also concerned about her kidney function -- her medical records show that she has had stable, appropriate creatinine levels and GFR.  She is currently on dicyclomine for her facial swelling (unkown etiology, currently being evaluated).    (below noted in her old medical records):  Urine cultures from 10.2020 and 11.2020 both positive for pseudomonas.  Past Medical History:  Diagnosis Date  . Anxiety   . Depression   . Hyperlipemia   . Hypertension   . Kidney stones   . Seizure ([place])   . Sleep apnea     Past Surgical History:  Procedure Laterality Date  . APPENDECTOMY    . CHOLECYSTECTOMY    . HERNIA REPAIR    . OVARIAN CYST SURGERY    . TONSILLECTOMY  05/1973    Home Medications:  (Not in a hospital admission)   Allergies:  Allergies  Allergen Reactions  . Ciprofloxacin Rash  . Clindamycin Rash    Reaction  unknown  . Influenza Vaccines Rash  . Latex Rash  . Other Rash    Bandages,etc.  . Sulfur Rash    Duplicate   . Temazepam Rash  . Enablex [Darifenacin Hydrobromide Er] Swelling    Causes swelling of patient's tongue  . Hydroxyzine Hcl     Anesthetic when had tonsillectomy. Caused convulsions.  . Prednisolone Other (See Comments)    Convulsions per pt  . Sulfa Antibiotics   . Amoxicillin Diarrhea  . Oxycodone-Acetaminophen Rash  . Pneumococcal Vaccine Swelling and Rash    Lymph node swelling    Family History  Problem Relation Age of Onset  . Cancer Mother   . Aneurysm Mother   . Cancer Father   . Hypertension Father   . Alzheimer's disease Father     Social History:  reports that she has never smoked. She has never used smokeless tobacco. She reports that she does not drink alcohol or use drugs.  ROS: A complete review of systems was performed.  All systems are negative except for pertinent findings as noted.  Physical Exam:  Vital signs in last 24 hours: Temp:  [97.7 F (36.5 C)-99.1 F (37.3 C)] 97.7 F (36.5 C) (02/23 1442) Pulse Rate:  [93-99] 99 (02/23 1442) BP: (133-151)/(84-87) 133/85 (02/23 1442) SpO2:  [97 %] 97 % (02/23 1056) Weight:  [290 lb (131.5 kg)-295 lb 6.4 oz (134 kg)] 290 lb (131.5 kg) (02/23 1442) General:  Alert  and oriented, No acute distress, Obese HEENT: Normocephalic, atraumatic Neck: No JVD or lymphadenopathy Cardiovascular: Regular rate and rhythm Lungs: Nml insp/exp excursion Abdomen: Obese Extremities: No edema Neurologic: Grossly intact  Laboratory Data:  Results for orders placed or performed in visit on 03/08/19 (from the past 24 hour(s))  POCT urinalysis dipstick     Status: Abnormal   Collection Time: 03/08/19  3:04 PM  Result Value Ref Range   Color, UA yellow    Clarity, UA cloudy    Glucose, UA Negative Negative   Bilirubin, UA neg    Ketones, UA moderate    Spec Grav, UA >=1.030 (A) 1.010 - 1.025   Blood, UA small     pH, UA 5.0 5.0 - 8.0   Protein, UA Positive (A) Negative   Urobilinogen, UA negative (A) 0.2 or 1.0 E.U./dL   Nitrite, UA neg    Leukocytes, UA Large (3+) (A) Negative   Appearance cloudy    Odor     I have reviewed prior pt notes  I have reviewed notes from referring/previous physicians  I have reviewed urinalysis results  I have reviewed prior urine culture  Impression/Assessment:  UA today is clear. It is likely that the bacteria in her urine is secondary to benign colonization that does not necessarily require treatment. I believe that most of her urinary sx's are more than likely related to an OAB -- will start w/ non-medical management before consulting on initiating medical therapy.   She does have extensive h/o nephrolithiasis  Plan:  1. 24 hr urine collection -- will forward results and specific recommendations for stone prevention.   2. Information given on OAB management. She was advised to use this information to plan behavioral and dietary modifications that may help to alleviate her sx's non-medically.    3. BMP today -- will forward these results when available.   4. Return for follow-up in 6 mo's w/ KUB prior.  Dena Billet 03/08/2019, 3:24 PM  Lillette Boxer. Kasyn Stouffer MD

## 2019-03-10 ENCOUNTER — Encounter: Payer: Self-pay | Admitting: Family Medicine

## 2019-03-10 LAB — URINE CULTURE: Culture: >=100000 — AB

## 2019-03-11 NOTE — Progress Notes (Signed)
Labs sent via my chart per Dr. Diona Fanti- labs are normal.

## 2019-03-14 ENCOUNTER — Telehealth: Payer: Self-pay

## 2019-03-14 ENCOUNTER — Other Ambulatory Visit: Payer: Self-pay

## 2019-03-14 NOTE — Telephone Encounter (Signed)
-----   Message from Dorisann Frames, RN sent at 03/14/2019  9:22 AM EST ----- Left message to return call ----- Message ----- From: Franchot Gallo, MD Sent: 03/12/2019   7:25 AM EST To: Dorisann Frames, RN  Notify pt--culture shows bacteria that is probably nml in her--no need to treat ----- Message ----- From: Dorisann Frames, RN Sent: 03/11/2019   2:14 PM EST To: Franchot Gallo, MD  Urine culture

## 2019-03-14 NOTE — Telephone Encounter (Signed)
Pt returned my call. Results of culture given per Dr. Diona Fanti

## 2019-03-15 ENCOUNTER — Encounter: Payer: Self-pay | Admitting: Family Medicine

## 2019-03-15 ENCOUNTER — Ambulatory Visit (INDEPENDENT_AMBULATORY_CARE_PROVIDER_SITE_OTHER): Payer: BC Managed Care – PPO | Admitting: Family Medicine

## 2019-03-15 VITALS — BP 147/98 | HR 92 | Temp 99.2°F | Ht 65.0 in | Wt 294.0 lb

## 2019-03-15 DIAGNOSIS — F411 Generalized anxiety disorder: Secondary | ICD-10-CM

## 2019-03-15 DIAGNOSIS — F5104 Psychophysiologic insomnia: Secondary | ICD-10-CM | POA: Diagnosis not present

## 2019-03-15 DIAGNOSIS — K580 Irritable bowel syndrome with diarrhea: Secondary | ICD-10-CM

## 2019-03-15 DIAGNOSIS — R21 Rash and other nonspecific skin eruption: Secondary | ICD-10-CM

## 2019-03-15 DIAGNOSIS — Z79899 Other long term (current) drug therapy: Secondary | ICD-10-CM | POA: Diagnosis not present

## 2019-03-15 DIAGNOSIS — F339 Major depressive disorder, recurrent, unspecified: Secondary | ICD-10-CM | POA: Diagnosis not present

## 2019-03-15 MED ORDER — RIFAXIMIN 550 MG PO TABS: 550.0000 mg | ORAL_TABLET | Freq: Three times a day (TID) | ORAL | 0 refills | Status: AC

## 2019-03-15 MED ORDER — ALPRAZOLAM 0.5 MG PO TABS: 0.2500 mg | ORAL_TABLET | Freq: Every day | ORAL | 2 refills | Status: DC | PRN

## 2019-03-15 NOTE — Patient Instructions (Signed)
You had labs performed today.  You will be contacted with the results of the labs once they are available, usually in the next 3 business days for routine lab work.  If you have an active my chart account, they will be released to your MyChart.  If you prefer to have these labs released to you via telephone, please let us know.  If you had a pap smear or biopsy performed, expect to be contacted in about 7-10 days.   Low-FODMAP Eating Plan  FODMAPs (fermentable oligosaccharides, disaccharides, monosaccharides, and polyols) are sugars that are hard for some people to digest. A low-FODMAP eating plan may help some people who have bowel (intestinal) diseases to manage their symptoms. This meal plan can be complicated to follow. Work with a diet and nutrition specialist (dietitian) to make a low-FODMAP eating plan that is right for you. A dietitian can make sure that you get enough nutrition from this diet. What are tips for following this plan? Reading food labels  Check labels for hidden FODMAPs such as: ? High-fructose syrup. ? Honey. ? Agave. ? Natural fruit flavors. ? Onion or garlic powder.  Choose low-FODMAP foods that contain 3-4 grams of fiber per serving.  Check food labels for serving sizes. Eat only one serving at a time to make sure FODMAP levels stay low. Meal planning  Follow a low-FODMAP eating plan for up to 6 weeks, or as told by your health care provider or dietitian.  To follow the eating plan: 1. Eliminate high-FODMAP foods from your diet completely. 2. Gradually reintroduce high-FODMAP foods into your diet one at a time. Most people should wait a few days after introducing one high-FODMAP food before they introduce the next high-FODMAP food. Your dietitian can recommend how quickly you may reintroduce foods. 3. Keep a daily record of what you eat and drink, and make note of any symptoms that you have after eating. 4. Review your daily record with a dietitian regularly.  Your dietitian can help you identify which foods you can eat and which foods you should avoid. General tips  Drink enough fluid each day to keep your urine pale yellow.  Avoid processed foods. These often have added sugar and may be high in FODMAPs.  Avoid most dairy products, whole grains, and sweeteners.  Work with a dietitian to make sure you get enough fiber in your diet. Recommended foods Grains  Gluten-free grains, such as rice, oats, buckwheat, quinoa, corn, polenta, and millet. Gluten-free pasta, bread, or cereal. Rice noodles. Corn tortillas. Vegetables  Eggplant, zucchini, cucumber, peppers, green beans, Brussels sprouts, bean sprouts, lettuce, arugula, kale, Swiss chard, spinach, collard greens, bok choy, summer squash, potato, and tomato. Limited amounts of corn, carrot, and sweet potato. Green parts of scallions. Fruits  Bananas, oranges, lemons, limes, blueberries, raspberries, strawberries, grapes, cantaloupe, honeydew melon, kiwi, papaya, passion fruit, and pineapple. Limited amounts of dried cranberries, banana chips, and shredded coconut. Dairy  Lactose-free milk, yogurt, and kefir. Lactose-free cottage cheese and ice cream. Non-dairy milks, such as almond, coconut, hemp, and rice milk. Yogurts made of non-dairy milks. Limited amounts of goat cheese, brie, mozzarella, parmesan, swiss, and other hard cheeses. Meats and other protein foods  Unseasoned beef, pork, poultry, or fish. Eggs. Berniece Salines. Tofu (firm) and tempeh. Limited amounts of nuts and seeds, such as almonds, walnuts, [place] nuts, pecans, peanuts, pumpkin seeds, chia seeds, and sunflower seeds. Fats and oils  Butter-free spreads. Vegetable oils, such as olive, canola, and sunflower oil. Seasoning and other  foods  Artificial sweeteners with names that do not end in "ol" such as aspartame, saccharine, and stevia. Maple syrup, white table sugar, raw sugar, brown sugar, and molasses. Fresh basil, coriander,  parsley, rosemary, and thyme. Beverages  Water and mineral water. Sugar-sweetened soft drinks. Small amounts of orange juice or cranberry juice. Black and green tea. Most dry wines. Coffee. This may not be a complete list of low-FODMAP foods. Talk with your dietitian for more information. Foods to avoid Grains  Wheat, including kamut, durum, and semolina. Barley and bulgur. Couscous. Wheat-based cereals. Wheat noodles, bread, crackers, and pastries. Vegetables  Chicory root, artichoke, asparagus, cabbage, snow peas, sugar snap peas, mushrooms, and cauliflower. Onions, garlic, leeks, and the white part of scallions. Fruits  Fresh, dried, and juiced forms of apple, pear, watermelon, peach, plum, cherries, apricots, blackberries, boysenberries, figs, nectarines, and mango. Avocado. Dairy  Milk, yogurt, ice cream, and soft cheese. Cream and sour cream. Milk-based sauces. Custard. Meats and other protein foods  Fried or fatty meat. Sausage. Cashews and pistachios. Soybeans, baked beans, black beans, chickpeas, kidney beans, fava beans, navy beans, lentils, and split peas. Seasoning and other foods  Any sugar-free gum or candy. Foods that contain artificial sweeteners such as sorbitol, mannitol, isomalt, or xylitol. Foods that contain honey, high-fructose corn syrup, or agave. Bouillon, vegetable stock, beef stock, and chicken stock. Garlic and onion powder. Condiments made with onion, such as hummus, chutney, pickles, relish, salad dressing, and salsa. Tomato paste. Beverages  Chicory-based drinks. Coffee substitutes. Chamomile tea. Fennel tea. Sweet or fortified wines such as port or sherry. Diet soft drinks made with isomalt, mannitol, maltitol, sorbitol, or xylitol. Apple, pear, and mango juice. Juices with high-fructose corn syrup. This may not be a complete list of high-FODMAP foods. Talk with your dietitian to discuss what dietary choices are best for you.  Summary  A low-FODMAP eating  plan is a short-term diet that eliminates FODMAPs from your diet to help ease symptoms of certain bowel diseases.  The eating plan usually lasts up to 6 weeks. After that, high-FODMAP foods are restarted gradually, one at a time, so you can find out which may be causing symptoms.  A low-FODMAP eating plan can be complicated. It is best to work with a dietitian who has experience with this type of plan. This information is not intended to replace advice given to you by your health care provider. Make sure you discuss any questions you have with your health care provider. Document Revised: 12/12/2016 Document Reviewed: 08/26/2016 Elsevier Patient Education  [place].

## 2019-03-15 NOTE — Progress Notes (Signed)
Subjective: CC: Generalized anxiety disorder PCP: [name], DO [name] Sherri Howard is a 64 y.o. female presenting to clinic today for:  1.  Generalized anxiety disorder/ diarrhea, rash Patient here for interval follow-up of generalized anxiety disorder.  She continues to have quite a bit of anxiety despite use of alprazolam, BuSpar and Viibryd.  She has been trying to isolate at home so as not to get infected with COVID-19.  She notes increased anxiety and depressive symptoms surrounding her current state of health.  She has had ongoing diarrheal symptoms and a rash on her face that has caused some eyelid swelling.  She saw her dermatologist several months ago but this was not addressed.  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  She has not seen her gastroenterologist in a while.  She has never been treated with Xifaxan.  She is currently taking probiotics and has intermittently used Imodium with some improvement in diarrhea.  However, it always recurs if she discontinues use of the Imodium.  Denies any overt constipation symptoms.  She think she is only been may be constipated once.  No blood in stool.  No nausea, vomiting, unplanned weight loss.  With regards to the rash, she has tried different moisturizers and has changed her make-up but this has not improved.  She does reports intermittently itchy.   ROS: Per HPI  Allergies  Allergen Reactions  . Ciprofloxacin Rash  . Clindamycin Rash    Reaction unknown  . Influenza Vaccines Rash  . Latex Rash  . Other Rash    Bandages,etc.  . Sulfur Rash    Duplicate   . Temazepam Rash  . Enablex [Darifenacin Hydrobromide Er] Swelling    Causes swelling of patient's tongue  . Hydroxyzine Hcl     Anesthetic when had tonsillectomy. Caused convulsions.  . Prednisolone Other (See Comments)    Convulsions per pt  . Sulfa Antibiotics   . Amoxicillin Diarrhea  . Oxycodone-Acetaminophen Rash  . Pneumococcal Vaccine Swelling and Rash     Lymph node swelling   Past Medical History:  Diagnosis Date  . Anxiety   . Depression   . Hyperlipemia   . Hypertension   . Kidney stones   . Seizure ([place])   . Sleep apnea     Current Outpatient Medications:  .  acetaminophen (TYLENOL) 325 MG tablet, Take 650 mg by mouth every 6 (six) hours as needed for mild pain or moderate pain. , Disp: , Rfl:  .  ALPRAZolam (XANAX) 0.5 MG tablet, Take 0.5-1 tablets (0.25-0.5 mg total) by mouth daily as needed for anxiety. May take extra tablet during the day prn panic., Disp: 45 tablet, Rfl: 2 .  betamethasone valerate ointment (VALISONE) 0.1 %, Apply 1 application topically 2 (two) times daily., Disp: 30 g, Rfl: 0 .  busPIRone (BUSPAR) 10 MG tablet, Take 2 tablets (20 mg total) by mouth daily., Disp: 360 tablet, Rfl: 1 .  CARTIA XT 240 MG 24 hr capsule, TAKE 1 CAPSULE DAILY, Disp: 90 capsule, Rfl: 3 .  cetirizine (ZYRTEC) 10 MG chewable tablet, Chew 10 mg by mouth daily., Disp: , Rfl:  .  doxycycline (VIBRA-TABS) 100 MG tablet, Take 1 tablet (100 mg total) by mouth 2 (two) times daily for 7 days. 1 po bid, Disp: 14 tablet, Rfl: 0 .  losartan (COZAAR) 100 MG tablet, TAKE 1 TABLET DAILY, Disp: 90 tablet, Rfl: 3 .  triamcinolone cream (KENALOG) 0.1 %, Apply 1 application topically 3 (three) times daily. Avoid  face and genitalia, Disp: 45 g, Rfl: 0 .  VIIBRYD 40 MG TABS, Take 1 tablet (40 mg total) by mouth daily., Disp: 90 tablet, Rfl: 1 Social History   Socioeconomic History  . Marital status: Married    Spouse name: Not on file  . Number of children: 1  . Years of education: Not on file  . Highest education level: Not on file  Occupational History  . Not on file  Tobacco Use  . Smoking status: Never Smoker  . Smokeless tobacco: Never Used  Substance and Sexual Activity  . Alcohol use: No  . Drug use: No  . Sexual activity: Not on file  Other Topics Concern  . Not on file  Social History Narrative  . Not on file   Social Determinants  of Health   Financial Resource Strain:   . Difficulty of Paying Living Expenses: Not on file  Food Insecurity:   . Worried About Charity fundraiser in the Last Year: Not on file  . Ran Out of Food in the Last Year: Not on file  Transportation Needs:   . Lack of Transportation (Medical): Not on file  . Lack of Transportation (Non-Medical): Not on file  Physical Activity:   . Days of Exercise per Week: Not on file  . Minutes of Exercise per Session: Not on file  Stress:   . Feeling of Stress : Not on file  Social Connections:   . Frequency of Communication with Friends and Family: Not on file  . Frequency of Social Gatherings with Friends and Family: Not on file  . Attends Religious Services: Not on file  . Active Member of Clubs or Organizations: Not on file  . Attends Archivist Meetings: Not on file  . Marital Status: Not on file  Intimate Partner Violence:   . Fear of Current or Ex-Partner: Not on file  . Emotionally Abused: Not on file  . Physically Abused: Not on file  . Sexually Abused: Not on file   Family History  Problem Relation Age of Onset  . Cancer Mother   . Aneurysm Mother   . Cancer Father   . Hypertension Father   . Alzheimer's disease Father     Objective: Office vital signs reviewed. BP (!) 147/98   Pulse 92   Temp 99.2 F (37.3 C) (Temporal)   Ht 5\' 5"  (1.651 m)   Wt 294 lb (133.4 kg)   SpO2 96%   BMI 48.92 kg/m   Physical Examination:  General: Awake, alert, obese, No acute distress HEENT: mild eyelid swelling noted. sclera white, tearful Pulm: normal work of breathing on room air GI: obese, soft, mild epigastric TTP. No palpable masses.  BS normal x4 Skin: Blanching, erythematous, slightly raised rash noted along the forehead.  No vesicular or pustular formation noted. Psych: mood depressed, speech normal. tearful  Depression screen [facility] 2/9 03/15/2019 03/08/2019 11/25/2018  Decreased Interest 2 0 1  Down, Depressed, Hopeless 2 0  1  PHQ - 2 Score 4 0 2  Altered sleeping 2 - 1  Tired, decreased energy 1 - 1  Change in appetite 1 - 0  Feeling bad or failure about yourself  3 - 1  Trouble concentrating 2 - 1  Moving slowly or fidgety/restless 0 - 0  Suicidal thoughts 0 - 0  PHQ-9 Score 13 - 6  Difficult doing work/chores Somewhat difficult - Somewhat difficult  Some recent data might be hidden   GAD 7 :  Generalized Anxiety Score 03/15/2019 11/25/2018 05/04/2018 10/27/2016  Nervous, Anxious, on Edge 2 1 1 3   Control/stop worrying 2 1 1 3   Worry too much - different things 2 1 1 3   Trouble relaxing 1 0 1 3  Restless 0 1 2 1   Easily annoyed or irritable 1 0 1 1  Afraid - awful might happen 0 1 0 0  Total GAD 7 Score 8 5 7 14   Anxiety Difficulty - Somewhat difficult Somewhat difficult Somewhat difficult   Assessment/ Plan: 64 y.o. female   1. Generalized anxiety disorder Not well controlled by things was exacerbated by need for toxassure.  She admitted to being anxious about not being able to produce sufficient amounts of urine for the drug screen today.  I placed a future order and she may come in at her convenience before next appointment to have this done. The Narcotic Database has been reviewed.  There were no red flags.   - ALPRAZolam (XANAX) 0.5 MG tablet; Take 0.5-1 tablets (0.25-0.5 mg total) by mouth daily as needed for anxiety. May take extra tablet during the day prn panic.  Dispense: 45 tablet; Refill: 2 - ToxASSURE Select 13 (MW), Urine; Future - TSH  2. Psychophysiological insomnia - ALPRAZolam (XANAX) 0.5 MG tablet; Take 0.5-1 tablets (0.25-0.5 mg total) by mouth daily as needed for anxiety. May take extra tablet during the day prn panic.  Dispense: 45 tablet; Refill: 2 - ToxASSURE Select 13 (MW), Urine; Future - TSH  3. Depression, recurrent ([place])  4. Controlled substance agreement signed - ToxASSURE Select 13 (MW), Urine; Future  5. Irritable bowel syndrome with diarrhea We will trial  Xifaxan for IBS-D.  We discussed alternatives including Viberzi.  I suspect that some of the diarrhea may be coming from malabsorption given history of colectomy.  I will check a TSH which was normal in 2017.  I recommend that if symptoms are not improving she may consider seeing her gastroenterologist again - rifaximin (XIFAXAN) 550 MG TABS tablet; Take 1 tablet (550 mg total) by mouth 3 (three) times daily for 14 days.  Dispense: 42 tablet; Refill: 0 - TSH  6. Facial rash Uncertain etiology.  Does not appear like a typical contact dermatitis.  I considered malar rash and therefore will check ANA, CRP, sed rate.  If blood work is negative, may need to consider seeing Dr. [place] Crane again for reevaluation. - ANA w/Reflex if Positive - C-reactive protein - Sedimentation Rate - CBC with Differential    No orders of the defined types were placed in this encounter.  No orders of the defined types were placed in this encounter.    [name], DO [place] [phone]

## 2019-03-22 ENCOUNTER — Encounter: Payer: Self-pay | Admitting: Family Medicine

## 2019-03-22 NOTE — Telephone Encounter (Signed)
Patient called and I spoke with patient. Patient did not go by the lab and have blood drawn. Patient will come back for blood draw.

## 2019-03-23 ENCOUNTER — Other Ambulatory Visit: Payer: BC Managed Care – PPO

## 2019-03-23 ENCOUNTER — Other Ambulatory Visit: Payer: Self-pay

## 2019-03-23 DIAGNOSIS — K580 Irritable bowel syndrome with diarrhea: Secondary | ICD-10-CM | POA: Diagnosis not present

## 2019-03-23 DIAGNOSIS — F5104 Psychophysiologic insomnia: Secondary | ICD-10-CM | POA: Diagnosis not present

## 2019-03-23 DIAGNOSIS — F411 Generalized anxiety disorder: Secondary | ICD-10-CM | POA: Diagnosis not present

## 2019-03-23 DIAGNOSIS — Z79899 Other long term (current) drug therapy: Secondary | ICD-10-CM | POA: Diagnosis not present

## 2019-03-23 DIAGNOSIS — R21 Rash and other nonspecific skin eruption: Secondary | ICD-10-CM | POA: Diagnosis not present

## 2019-03-23 DIAGNOSIS — H10013 Acute follicular conjunctivitis, bilateral: Secondary | ICD-10-CM | POA: Diagnosis not present

## 2019-03-23 NOTE — Addendum Note (Signed)
Addended by: Earlene Plater on: 03/23/2019 02:46 PM   Modules accepted: Orders

## 2019-03-24 LAB — CBC WITH DIFFERENTIAL/PLATELET
Basophils Absolute: 0.1 10*3/uL (ref 0.0–0.2)
Basos: 1 %
EOS (ABSOLUTE): 0.2 10*3/uL (ref 0.0–0.4)
Eos: 2 %
Hematocrit: 44.3 % (ref 34.0–46.6)
Hemoglobin: 14.6 g/dL (ref 11.1–15.9)
Immature Grans (Abs): 0 10*3/uL (ref 0.0–0.1)
Immature Granulocytes: 0 %
Lymphocytes Absolute: 2.3 10*3/uL (ref 0.7–3.1)
Lymphs: 36 %
MCH: 28.5 pg (ref 26.6–33.0)
MCHC: 33 g/dL (ref 31.5–35.7)
MCV: 87 fL (ref 79–97)
Monocytes Absolute: 0.7 10*3/uL (ref 0.1–0.9)
Monocytes: 11 %
Neutrophils Absolute: 3.2 10*3/uL (ref 1.4–7.0)
Neutrophils: 50 %
Platelets: 303 10*3/uL (ref 150–450)
RBC: 5.12 x10E6/uL (ref 3.77–5.28)
RDW: 12.9 % (ref 11.7–15.4)
WBC: 6.4 10*3/uL (ref 3.4–10.8)

## 2019-03-24 LAB — C-REACTIVE PROTEIN: CRP: 2 mg/L (ref 0–10)

## 2019-03-24 LAB — SEDIMENTATION RATE: Sed Rate: 18 mm/hr (ref 0–40)

## 2019-03-24 LAB — TSH: TSH: 3.35 u[IU]/mL (ref 0.450–4.500)

## 2019-03-24 LAB — ANA W/REFLEX IF POSITIVE: Anti Nuclear Antibody (ANA): NEGATIVE

## 2019-03-27 LAB — TOXASSURE SELECT 13 (MW), URINE

## 2019-04-04 ENCOUNTER — Encounter: Payer: Self-pay | Admitting: Family Medicine

## 2019-04-05 ENCOUNTER — Encounter: Payer: Self-pay | Admitting: Family Medicine

## 2019-05-04 ENCOUNTER — Encounter: Payer: Self-pay | Admitting: Family Medicine

## 2019-05-25 ENCOUNTER — Other Ambulatory Visit: Payer: Self-pay | Admitting: Family Medicine

## 2019-05-25 DIAGNOSIS — F411 Generalized anxiety disorder: Secondary | ICD-10-CM

## 2019-05-25 DIAGNOSIS — F5104 Psychophysiologic insomnia: Secondary | ICD-10-CM

## 2019-06-06 ENCOUNTER — Encounter: Payer: Self-pay | Admitting: Family Medicine

## 2019-06-15 ENCOUNTER — Ambulatory Visit: Payer: BC Managed Care – PPO | Admitting: Family Medicine

## 2019-06-20 ENCOUNTER — Encounter: Payer: Self-pay | Admitting: Family Medicine

## 2019-06-23 ENCOUNTER — Other Ambulatory Visit: Payer: Self-pay | Admitting: Family Medicine

## 2019-06-23 DIAGNOSIS — I10 Essential (primary) hypertension: Secondary | ICD-10-CM

## 2019-06-24 ENCOUNTER — Ambulatory Visit (INDEPENDENT_AMBULATORY_CARE_PROVIDER_SITE_OTHER): Payer: BC Managed Care – PPO | Admitting: Family Medicine

## 2019-06-24 ENCOUNTER — Other Ambulatory Visit: Payer: Self-pay

## 2019-06-24 ENCOUNTER — Encounter: Payer: Self-pay | Admitting: Family Medicine

## 2019-06-24 VITALS — BP 131/77 | HR 85 | Temp 98.0°F | Ht 65.0 in | Wt 272.4 lb

## 2019-06-24 DIAGNOSIS — F5104 Psychophysiologic insomnia: Secondary | ICD-10-CM | POA: Diagnosis not present

## 2019-06-24 DIAGNOSIS — F411 Generalized anxiety disorder: Secondary | ICD-10-CM

## 2019-06-24 DIAGNOSIS — I10 Essential (primary) hypertension: Secondary | ICD-10-CM

## 2019-06-24 DIAGNOSIS — R21 Rash and other nonspecific skin eruption: Secondary | ICD-10-CM

## 2019-06-24 DIAGNOSIS — N62 Hypertrophy of breast: Secondary | ICD-10-CM

## 2019-06-24 DIAGNOSIS — F339 Major depressive disorder, recurrent, unspecified: Secondary | ICD-10-CM | POA: Diagnosis not present

## 2019-06-24 DIAGNOSIS — Z87442 Personal history of urinary calculi: Secondary | ICD-10-CM

## 2019-06-24 MED ORDER — ALPRAZOLAM 0.5 MG PO TABS: 0.2500 mg | ORAL_TABLET | Freq: Every day | ORAL | 2 refills | Status: DC | PRN

## 2019-06-24 NOTE — Patient Instructions (Addendum)
OKHTXH (once weekly injectable for weight loss) Saxenda (once daily)  Genesight testing for antidepressants with [facility]  Let me know if you want to see the breast surgeon.  Trazodone does interact with the Viibryd so I didn't prescribe this today.  You had the autoimmune labs drawn in March.  They were normal.

## 2019-06-24 NOTE — Progress Notes (Signed)
Subjective: CC: Generalized anxiety disorder PCP: [name], DO [id number] Sherri Howard is a 64 y.o. female presenting to clinic today for:  1.  Generalized anxiety disorder Patient with ongoing generalized anxiety disorder, panic and worsening depressive symptoms.  She reports having been tearful very frequently, mood has been down.  She is been having quite a bit of difficulty with sleep and describes episodes of sleep paralysis.  Sometimes sleep will be only about 3 to 4 hours but sometimes she will sleep 11 hours at a time.  She does admit that her CPAP needs to be replaced.  She is wondering if she would benefit from a sleep aid.  Of note she continues to use alprazolam daily.  She is on buspirone as well as her Viibryd.  Denies any falls, respiratory depression or mental status changes with the Xanax.  She would be willing to undergo GeneSight testing if insurance would pay for.  2.  Rash Patient continues to have leg swelling and intermittent rash on various parts of her body.  Most recently she developed a rash on her left ankle and has scratched it quite a bit.  These are refractory to topical corticosteroids.  She has not yet called the dermatologist for an appointment but plans on establishing soon.  3.  Morbid obesity Patient is interested in pursuing weight loss medication.  She is trying to lose weight on her own but is having some difficulty.  Denies any history of thyroid cancer, MEN tumors within the family, DKA or pancreatitis.    4.  Macromastia Patient reports that she is very interested in potentially getting a breast reduction.  She has indentions on her shoulder from her brassiere.  She reports upper thoracic back pain.  She wears a 48 triple D/E.  ROS: Per HPI  Allergies  Allergen Reactions  . Ciprofloxacin Rash  . Clindamycin Rash    Reaction unknown  . Influenza Vaccines Rash  . Latex Rash  . Other Rash    Bandages,etc.  . Sulfur Rash    Duplicate   .  Temazepam Rash  . Enablex [Darifenacin Hydrobromide Er] Swelling    Causes swelling of patient's tongue  . Hydroxyzine Hcl     Anesthetic when had tonsillectomy. Caused convulsions.  . Prednisolone Other (See Comments)    Convulsions per pt  . Sulfa Antibiotics   . Amoxicillin Diarrhea  . Oxycodone-Acetaminophen Rash  . Pneumococcal Vaccine Swelling and Rash    Lymph node swelling   Past Medical History:  Diagnosis Date  . Anxiety   . Depression   . Hyperlipemia   . Hypertension   . Kidney stones   . Seizure ([place])   . Sleep apnea     Current Outpatient Medications:  .  acetaminophen (TYLENOL) 325 MG tablet, Take 650 mg by mouth every 6 (six) hours as needed for mild pain or moderate pain. , Disp: , Rfl:  .  ALPRAZolam (XANAX) 0.5 MG tablet, Take 0.5-1 tablets (0.25-0.5 mg total) by mouth daily as needed for anxiety. May take extra tablet during the day prn panic., Disp: 45 tablet, Rfl: 2 .  busPIRone (BUSPAR) 10 MG tablet, Take 2 tablets (20 mg total) by mouth daily., Disp: 360 tablet, Rfl: 1 .  CARTIA XT 240 MG 24 hr capsule, TAKE 1 CAPSULE DAILY, Disp: 90 capsule, Rfl: 3 .  losartan (COZAAR) 100 MG tablet, TAKE 1 TABLET DAILY, Disp: 90 tablet, Rfl: 0 .  VIIBRYD 40 MG TABS, Take 1 tablet (  40 mg total) by mouth daily., Disp: 90 tablet, Rfl: 1 Social History   Socioeconomic History  . Marital status: Married    Spouse name: Not on file  . Number of children: 1  . Years of education: Not on file  . Highest education level: Not on file  Occupational History  . Not on file  Tobacco Use  . Smoking status: Never Smoker  . Smokeless tobacco: Never Used  Vaping Use  . Vaping Use: Never used  Substance and Sexual Activity  . Alcohol use: No  . Drug use: No  . Sexual activity: Not on file  Other Topics Concern  . Not on file  Social History Narrative  . Not on file   Social Determinants of Health   Financial Resource Strain:   . Difficulty of Paying Living Expenses:     Food Insecurity:   . Worried About Charity fundraiser in the Last Year:   . Arboriculturist in the Last Year:   Transportation Needs:   . Film/video editor (Medical):   Marland Kitchen Lack of Transportation (Non-Medical):   Physical Activity:   . Days of Exercise per Week:   . Minutes of Exercise per Session:   Stress:   . Feeling of Stress :   Social Connections:   . Frequency of Communication with Friends and Family:   . Frequency of Social Gatherings with Friends and Family:   . Attends Religious Services:   . Active Member of Clubs or Organizations:   . Attends Archivist Meetings:   Marland Kitchen Marital Status:   Intimate Partner Violence:   . Fear of Current or Ex-Partner:   . Emotionally Abused:   Marland Kitchen Physically Abused:   . Sexually Abused:    Family History  Problem Relation Age of Onset  . Cancer Mother   . Aneurysm Mother   . Cancer Father   . Hypertension Father   . Alzheimer's disease Father     Objective: Office vital signs reviewed. BP 131/77   Pulse 85   Temp 98 F (36.7 C)   Ht 5' 5" (1.651 m)   Wt 272 lb 6.4 oz (123.6 kg)   SpO2 97%   BMI 45.33 kg/m   Physical Examination:  General: Awake, alert, obese, No acute distress HEENT: mild eyelid swelling noted. sclera white Pulm: normal work of breathing on room air GI: obese Skin: excoriation noted along left anterior ankle. Psych: mood able, speech normal.   Depression screen [facility] 2/9 06/24/2019 03/15/2019 03/08/2019  Decreased Interest 2 2 0  Down, Depressed, Hopeless 2 2 0  PHQ - 2 Score 4 4 0  Altered sleeping 3 2 -  Tired, decreased energy 3 1 -  Change in appetite 1 1 -  Feeling bad or failure about yourself  3 3 -  Trouble concentrating 2 2 -  Moving slowly or fidgety/restless 0 0 -  Suicidal thoughts 1 0 -  PHQ-9 Score 17 13 -  Difficult doing work/chores Not difficult at all Somewhat difficult -  Some recent data might be hidden   GAD 7 : Generalized Anxiety Score 06/24/2019 03/15/2019  11/25/2018 05/04/2018  Nervous, Anxious, on Edge _0 Control/stop worrying _1 Worry too much - different things _2 Trouble relaxing 1 1 0 1  Restless 2 0 1 2  Easily annoyed or irritable 0 1 0 1  Afraid - awful might happen 1  0 1 0  Total GAD 7 Score _0 Anxiety Difficulty Not difficult at all - Somewhat difficult Somewhat difficult   Assessment/ Plan: 64 y.o. female   1. Generalized anxiety disorder Not really well controlled nor is her depression.  We discussed GeneSight testing and consideration for this as she has had intolerances to various other medications.  She is going to check in with her insurance and schedule accordingly.  In the meantime, the national chronic database was reviewed and her Xanax was renewed.  Were going to hold off on Viibryd until she decides whether or not she wants to undergo GeneSight testing - ALPRAZolam (XANAX) 0.5 MG tablet; Take 0.5-1 tablets (0.25-0.5 mg total) by mouth daily as needed for anxiety. May take extra tablet during the day prn panic.  Dispense: 45 tablet; Refill: 2  2. Depression, recurrent ([place])  3. Psychophysiological insomnia - ALPRAZolam (XANAX) 0.5 MG tablet; Take 0.5-1 tablets (0.25-0.5 mg total) by mouth daily as needed for anxiety. May take extra tablet during the day prn panic.  Dispense: 45 tablet; Refill: 2  4. Essential hypertension Controlled  5. History of renal calculi - Urine culture (clean catch)  6. Morbid obesity ([place]) We discussed consideration for [place].  No apparent contraindications.  We also discussed stimulant medications but I think that her risk for worsening blood pressure would outweigh the benefits of these.  7. Macromastia She will let me know she would like to see a breast surgeon about this  8. Rash I am going to obtain an ANA to evaluate for possible malar rash.  However, her ESR and CRP were normal so not entirely sure if this is autoimmune in nature.  I  recommend that she proceed with dermatologic evaluation - ANA w/Reflex if Positive    No orders of the defined types were placed in this encounter.  No orders of the defined types were placed in this encounter.    [name], DO [place] [phone]

## 2019-06-25 LAB — ANA W/REFLEX IF POSITIVE: Anti Nuclear Antibody (ANA): NEGATIVE

## 2019-07-19 ENCOUNTER — Telehealth: Payer: Self-pay | Admitting: Family Medicine

## 2019-07-19 DIAGNOSIS — F411 Generalized anxiety disorder: Secondary | ICD-10-CM

## 2019-07-19 MED ORDER — VIIBRYD 40 MG PO TABS: 40.0000 mg | ORAL_TABLET | Freq: Every day | ORAL | 1 refills | Status: DC

## 2019-07-19 NOTE — Telephone Encounter (Signed)
SHE is supposed to call her insurance about GeneSite testing and schedule with Britney if she wants to proceed.  Ok to renew viibryd.

## 2019-07-19 NOTE — Telephone Encounter (Signed)
Pt aware of provider feedback and will come by office to get the pamphlet as well and call her insurance. Rx for Viibryd renewed and pt aware.

## 2019-07-19 NOTE — Telephone Encounter (Signed)
I do have some. I put one in my out-basket with her name on it if she wants to pick it up.

## 2019-07-19 NOTE — Telephone Encounter (Signed)
I will cc Britney to see if she has a pamphlet for pt.

## 2019-07-19 NOTE — Telephone Encounter (Signed)
Pt states she thought you were going to check on the Genesight testing to see if her insurance covers it or not. Do we have a pamphlet we could mail her on the genesight testing? Is it ok to send refills in on the Viibryd?

## 2019-07-19 NOTE — Telephone Encounter (Signed)
  Prescription Request  07/19/2019  What is the name of the medication or equipment? VIIBRYD 40 MG TABS    Have you contacted your pharmacy to request a refill? (if applicable) YES  Which pharmacy would you like this sent to? [facility], PT was seen 06/24/2019 and refill was not sent to pharmacy    Patient notified that their request is being sent to the clinical staff for review and that they should receive a response within 2 business days.

## 2019-08-11 DIAGNOSIS — L258 Unspecified contact dermatitis due to other agents: Secondary | ICD-10-CM | POA: Diagnosis not present

## 2019-08-11 DIAGNOSIS — L570 Actinic keratosis: Secondary | ICD-10-CM | POA: Diagnosis not present

## 2019-08-11 DIAGNOSIS — L821 Other seborrheic keratosis: Secondary | ICD-10-CM | POA: Diagnosis not present

## 2019-08-11 DIAGNOSIS — X32XXXA Exposure to sunlight, initial encounter: Secondary | ICD-10-CM | POA: Diagnosis not present

## 2019-08-18 ENCOUNTER — Telehealth: Payer: Self-pay | Admitting: Family Medicine

## 2019-08-18 DIAGNOSIS — R609 Edema, unspecified: Secondary | ICD-10-CM | POA: Diagnosis not present

## 2019-08-18 DIAGNOSIS — L570 Actinic keratosis: Secondary | ICD-10-CM | POA: Diagnosis not present

## 2019-08-18 DIAGNOSIS — X32XXXD Exposure to sunlight, subsequent encounter: Secondary | ICD-10-CM | POA: Diagnosis not present

## 2019-08-18 NOTE — Telephone Encounter (Signed)
I will defer to PCP

## 2019-08-19 ENCOUNTER — Encounter: Payer: Self-pay | Admitting: Family Medicine

## 2019-08-19 NOTE — Telephone Encounter (Signed)
Pt rc asked to talk to [facility]. Please call back

## 2019-08-19 NOTE — Telephone Encounter (Signed)
Called Dr Juel Burrow office and they are faxing the records over and pt is aware we are awaiting records for provider to review.

## 2019-08-19 NOTE — Telephone Encounter (Signed)
Please get records from Dr [facility] office so I can review.

## 2019-08-22 ENCOUNTER — Other Ambulatory Visit: Payer: Self-pay | Admitting: Family Medicine

## 2019-08-22 DIAGNOSIS — R202 Paresthesia of skin: Secondary | ICD-10-CM

## 2019-08-22 DIAGNOSIS — R200 Anesthesia of skin: Secondary | ICD-10-CM

## 2019-08-22 DIAGNOSIS — R220 Localized swelling, mass and lump, head: Secondary | ICD-10-CM

## 2019-08-22 NOTE — Progress Notes (Signed)
Patient with right upper extremity numbness and tingling.  She is had facial swelling for several months.  She was recently seen by her dermatologist who had concern for possible thoracic outlet syndrome.  Given constellation of symptoms and discussion with the radiologist at [facility] radiology, will proceed with CTA chest as per the recommendations.

## 2019-09-02 ENCOUNTER — Encounter: Payer: Self-pay | Admitting: Family Medicine

## 2019-09-02 ENCOUNTER — Other Ambulatory Visit: Payer: Self-pay | Admitting: Family Medicine

## 2019-09-02 DIAGNOSIS — I10 Essential (primary) hypertension: Secondary | ICD-10-CM

## 2019-09-05 ENCOUNTER — Other Ambulatory Visit: Payer: Self-pay | Admitting: Family Medicine

## 2019-09-05 DIAGNOSIS — R1909 Other intra-abdominal and pelvic swelling, mass and lump: Secondary | ICD-10-CM

## 2019-09-05 DIAGNOSIS — R1031 Right lower quadrant pain: Secondary | ICD-10-CM

## 2019-09-06 ENCOUNTER — Ambulatory Visit: Payer: Self-pay | Admitting: Urology

## 2019-09-13 ENCOUNTER — Telehealth: Payer: Self-pay | Admitting: Family Medicine

## 2019-09-13 NOTE — Telephone Encounter (Signed)
Lmtcb.

## 2019-09-13 NOTE — Telephone Encounter (Signed)
Pt called regarding her referral to have a CT scan done at [facility] on 09/15/19. Pt says originally she was told that she needed to have a CT scan done on her head, neck, and shoulders due to the swelling of her eyes and forehead, but now is being scheduled for chest and abdomin. Pt is confused as to why she needs to have a CT scan done on her chest. Pt says she did request to have abdomin done while she was there because she has issues with hernia's and kidney stones. Pt just wants clarification.

## 2019-09-13 NOTE — Telephone Encounter (Signed)
We are ordering CT chest to rule out thoracic outlet syndrome.  Neck and Head CT are not indicated.

## 2019-09-15 ENCOUNTER — Other Ambulatory Visit: Payer: Self-pay

## 2019-09-15 ENCOUNTER — Ambulatory Visit (HOSPITAL_COMMUNITY)
Admission: RE | Admit: 2019-09-15 | Discharge: 2019-09-15 | Disposition: A | Discharge status: Home / Self Care | Payer: BC Managed Care – PPO | Source: Ambulatory Visit | Attending: Family Medicine | Admitting: Family Medicine

## 2019-09-15 DIAGNOSIS — K439 Ventral hernia without obstruction or gangrene: Secondary | ICD-10-CM | POA: Diagnosis not present

## 2019-09-15 DIAGNOSIS — N200 Calculus of kidney: Secondary | ICD-10-CM | POA: Diagnosis not present

## 2019-09-15 DIAGNOSIS — R200 Anesthesia of skin: Secondary | ICD-10-CM | POA: Diagnosis not present

## 2019-09-15 DIAGNOSIS — R202 Paresthesia of skin: Secondary | ICD-10-CM | POA: Insufficient documentation

## 2019-09-15 DIAGNOSIS — R220 Localized swelling, mass and lump, head: Secondary | ICD-10-CM

## 2019-09-15 DIAGNOSIS — R1031 Right lower quadrant pain: Secondary | ICD-10-CM | POA: Diagnosis not present

## 2019-09-15 DIAGNOSIS — I700 Atherosclerosis of aorta: Secondary | ICD-10-CM | POA: Diagnosis not present

## 2019-09-15 DIAGNOSIS — K429 Umbilical hernia without obstruction or gangrene: Secondary | ICD-10-CM | POA: Diagnosis not present

## 2019-09-15 MED ORDER — IOHEXOL 350 MG/ML SOLN
100.0000 mL | Freq: Once | INTRAVENOUS | Status: AC | PRN
  Administered 2019-09-15: 100 mL via INTRAVENOUS

## 2019-09-16 LAB — POCT I-STAT CREATININE: Creatinine, Ser: 1.1 mg/dL — ABNORMAL HIGH (ref 0.44–1.00)

## 2019-09-19 ENCOUNTER — Encounter: Payer: Self-pay | Admitting: Family Medicine

## 2019-09-28 ENCOUNTER — Other Ambulatory Visit: Payer: Self-pay | Admitting: Family Medicine

## 2019-09-28 DIAGNOSIS — F411 Generalized anxiety disorder: Secondary | ICD-10-CM

## 2019-09-28 DIAGNOSIS — F5104 Psychophysiologic insomnia: Secondary | ICD-10-CM

## 2019-09-29 NOTE — Telephone Encounter (Signed)
II can't deny this, it will not let me

## 2019-10-10 ENCOUNTER — Telehealth: Payer: Self-pay | Admitting: Family Medicine

## 2019-10-10 NOTE — Telephone Encounter (Signed)
Pt informed visit needed to refill medication. Appt made for 11/07/19

## 2019-10-24 ENCOUNTER — Other Ambulatory Visit: Payer: Self-pay | Admitting: Family Medicine

## 2019-10-24 DIAGNOSIS — R1013 Epigastric pain: Secondary | ICD-10-CM

## 2019-11-07 ENCOUNTER — Encounter: Payer: Self-pay | Admitting: Family Medicine

## 2019-11-07 ENCOUNTER — Other Ambulatory Visit: Payer: Self-pay

## 2019-11-07 ENCOUNTER — Ambulatory Visit (INDEPENDENT_AMBULATORY_CARE_PROVIDER_SITE_OTHER): Payer: BC Managed Care – PPO | Admitting: Family Medicine

## 2019-11-07 VITALS — BP 129/76 | HR 81 | Temp 97.5°F | Ht 65.0 in | Wt 276.6 lb

## 2019-11-07 DIAGNOSIS — F339 Major depressive disorder, recurrent, unspecified: Secondary | ICD-10-CM | POA: Diagnosis not present

## 2019-11-07 DIAGNOSIS — I10 Essential (primary) hypertension: Secondary | ICD-10-CM

## 2019-11-07 DIAGNOSIS — Z79899 Other long term (current) drug therapy: Secondary | ICD-10-CM

## 2019-11-07 DIAGNOSIS — F411 Generalized anxiety disorder: Secondary | ICD-10-CM | POA: Diagnosis not present

## 2019-11-07 DIAGNOSIS — J9801 Acute bronchospasm: Secondary | ICD-10-CM

## 2019-11-07 DIAGNOSIS — F5104 Psychophysiologic insomnia: Secondary | ICD-10-CM

## 2019-11-07 DIAGNOSIS — K7689 Other specified diseases of liver: Secondary | ICD-10-CM

## 2019-11-07 DIAGNOSIS — R220 Localized swelling, mass and lump, head: Secondary | ICD-10-CM | POA: Diagnosis not present

## 2019-11-07 DIAGNOSIS — N3001 Acute cystitis with hematuria: Secondary | ICD-10-CM

## 2019-11-07 DIAGNOSIS — R300 Dysuria: Secondary | ICD-10-CM | POA: Diagnosis not present

## 2019-11-07 LAB — MICROSCOPIC EXAMINATION

## 2019-11-07 LAB — BAYER DCA HB A1C WAIVED: HB A1C (BAYER DCA - WAIVED): 5.3 % (ref <7.0)

## 2019-11-07 LAB — URINALYSIS, COMPLETE
Bilirubin, UA: NEGATIVE
Glucose, UA: NEGATIVE
Ketones, UA: NEGATIVE
Nitrite, UA: NEGATIVE
Protein,UA: NEGATIVE
Specific Gravity, UA: 1.025 (ref 1.005–1.030)
Urobilinogen, Ur: 0.2 mg/dL (ref 0.2–1.0)
pH, UA: 6 (ref 5.0–7.5)

## 2019-11-07 MED ORDER — ALPRAZOLAM 0.5 MG PO TABS
0.2500 mg | ORAL_TABLET | Freq: Every day | ORAL | 5 refills | Status: DC | PRN
Start: 2019-11-07 — End: 2020-07-31

## 2019-11-07 MED ORDER — LOSARTAN POTASSIUM 100 MG PO TABS: 100.0000 mg | ORAL_TABLET | Freq: Every day | ORAL | 3 refills | Status: DC

## 2019-11-07 MED ORDER — PANTOPRAZOLE SODIUM 40 MG PO TBEC: 40.0000 mg | DELAYED_RELEASE_TABLET | Freq: Every day | ORAL | 3 refills | Status: DC

## 2019-11-07 MED ORDER — VIIBRYD 40 MG PO TABS: 40.0000 mg | ORAL_TABLET | Freq: Every day | ORAL | 1 refills | Status: DC

## 2019-11-07 MED ORDER — CEFDINIR 300 MG PO CAPS: 300.0000 mg | ORAL_CAPSULE | Freq: Two times a day (BID) | ORAL | 0 refills | Status: AC

## 2019-11-07 NOTE — Patient Instructions (Addendum)
PHXTAV Saxenda Topamax Wellbutrin Phentermine  Colon is due 2023   Your provider wants you to schedule an appointment with a Psychologist/Psychiatrist. The following list of offices requires the patient to call and make their own appointment, as there is information they need that only you can provide. Please feel free to choose form the following providers:  [facility] in [place]  [place]  [phone] [place], [place]  (Scheduled through [place]) Must call and do an interview for appointment. Sees Children / Accepts Medicaid  [facility] [place]  [address], [place]    [place], [place]  [phone] [place], [place] for Autism but does not treat it Sees Children / Accepts Medicaid  [facility]    [phone] [address] [place], [place] Medication management, substance abuse, bipolar, grief, family, marriage, OCD, anxiety, PTSD Sees children / Accepts Medicaid  [facility]    [phone] [address], [place], [place] children / Accepts [facility]  [facility]  [phone] [address] [place], [place]   Dr Lorenza Evangelist     [phone] [address], [place], [place]  Sees ADD & ADHD for treatment Accepts Medicaid  [facility]  [phone] [address] Arlean Hopping, [place] for Autism Accepts [facility]  [facility]  [phone] [place], [place]  Does Adult ADD evaluations Does not accept Medicaid  [name]   [phone] [place], [place] children as young as 69 years old Accepts [facility]     [phone]    [place],  [phone] Sees  children Accepts Medicaid

## 2019-11-07 NOTE — Progress Notes (Signed)
Subjective: CC: Generalized anxiety disorder, facial swelling, obesity PCP: [name], DO IRC:VELFY L Pincock is a 64 y.o. female presenting to clinic today for:  1.  Generalized anxiety disorder She has ongoing, unchanged anxiety disorder with panic.  She reports depressive symptoms.  She has emotional lability.  Sometimes she will cry out of nowhere.  She has not sought counseling services.  She did not pursue GeneSight testing due to financial restrictions.  No SI or HI.  She does report that she will be expecting a grandchild in May 2022.  This is her first grandchild and she lives about 5 minutes from the parents.  She is very excited about this.  2.  Rash Ongoing facial swelling and rash.  She has been using a topical prescribed by Dr. Nevada Crane to the forehead.  The eye swelling seems to be improving some but is still quite present.  She reports intermittent bronchospasm, that she describes as difficulty getting a good breath in.  Denies overt dyspnea or change in activity tolerance.  She would like to see an allergist if possible  3.  Morbid obesity She is still interested in pursuing weight loss medicine.  She did not look up any of the medicines we discussed last visit but would like these again.  She has not pursued any surgical intervention for macromastia due to Covid.  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  She would like a renewal on this medicine.  She has had some stabbing intermittent chest and head pain that is not accompanied by any nausea, vomiting, diaphoresis, shortness of breath, dizziness.  She is compliant with her Cartia and losartan  ROS: Per HPI  Allergies  Allergen Reactions  . Ciprofloxacin Rash  . Clindamycin Rash    Reaction unknown  . Influenza Vaccines Rash  . Latex Rash  . Other Rash    Bandages,etc.  . Sulfur Rash    Duplicate   . Temazepam Rash  . Enablex [Darifenacin Hydrobromide Er] Swelling    Causes swelling of  patient's tongue  . Hydroxyzine Hcl     Anesthetic when had tonsillectomy. Caused convulsions.  . Prednisolone Other (See Comments)    Convulsions per pt  . Sulfa Antibiotics   . Amoxicillin Diarrhea  . Oxycodone-Acetaminophen Rash  . Pneumococcal Vaccine Swelling and Rash    Lymph node swelling   Past Medical History:  Diagnosis Date  . Anxiety   . Depression   . Hyperlipemia   . Hypertension   . Kidney stones   . Seizure ([place])   . Sleep apnea     Current Outpatient Medications:  .  acetaminophen (TYLENOL) 325 MG tablet, Take 650 mg by mouth every 6 (six) hours as needed for mild pain or moderate pain. , Disp: , Rfl:  .  ALPRAZolam (XANAX) 0.5 MG tablet, Take 0.5-1 tablets (0.25-0.5 mg total) by mouth daily as needed for anxiety. May take extra tablet during the day prn panic., Disp: 45 tablet, Rfl: 2 .  busPIRone (BUSPAR) 10 MG tablet, Take 2 tablets (20 mg total) by mouth daily., Disp: 360 tablet, Rfl: 1 .  CARTIA XT 240 MG 24 hr capsule, TAKE 1 CAPSULE DAILY, Disp: 90 capsule, Rfl: 3 .  losartan (COZAAR) 100 MG tablet, TAKE 1 TABLET DAILY, Disp: 90 tablet, Rfl: 0 .  VIIBRYD 40 MG TABS, Take 1 tablet (40 mg total) by mouth daily., Disp: 90 tablet, Rfl: 1 Social History   Socioeconomic History  .  Marital status: Married    Spouse name: Not on file  . Number of children: 1  . Years of education: Not on file  . Highest education level: Not on file  Occupational History  . Not on file  Tobacco Use  . Smoking status: Never Smoker  . Smokeless tobacco: Never Used  Vaping Use  . Vaping Use: Never used  Substance and Sexual Activity  . Alcohol use: No  . Drug use: No  . Sexual activity: Not on file  Other Topics Concern  . Not on file  Social History Narrative  . Not on file   Social Determinants of Health   Financial Resource Strain:   . Difficulty of Paying Living Expenses: Not on file  Food Insecurity:   . Worried About Charity fundraiser in the Last Year: Not  on file  . Ran Out of Food in the Last Year: Not on file  Transportation Needs:   . Lack of Transportation (Medical): Not on file  . Lack of Transportation (Non-Medical): Not on file  Physical Activity:   . Days of Exercise per Week: Not on file  . Minutes of Exercise per Session: Not on file  Stress:   . Feeling of Stress : Not on file  Social Connections:   . Frequency of Communication with Friends and Family: Not on file  . Frequency of Social Gatherings with Friends and Family: Not on file  . Attends Religious Services: Not on file  . Active Member of Clubs or Organizations: Not on file  . Attends Archivist Meetings: Not on file  . Marital Status: Not on file  Intimate Partner Violence:   . Fear of Current or Ex-Partner: Not on file  . Emotionally Abused: Not on file  . Physically Abused: Not on file  . Sexually Abused: Not on file   Family History  Problem Relation Age of Onset  . Cancer Mother   . Aneurysm Mother   . Cancer Father   . Hypertension Father   . Alzheimer's disease Father     Objective: Office vital signs reviewed. BP 129/76   Pulse 81   Temp (!) 97.5 F (36.4 C)   Ht 5' 5"  (1.651 m)   Wt 276 lb 9.6 oz (125.5 kg)   SpO2 94%   BMI 46.03 kg/m   Physical Examination:  General: Awake, alert, morbidly obese, No acute distress HEENT: Mild swelling of bilateral eyelids noted.  She has hyperkeratotic changes to the forehead and face. Pulm: Clear to auscultation bilaterally.  Normal air entry.  No wheezes, rhonchi or rales.  Normal work of breathing on room air Psych: Mood stable, speech normal, affect appropriate, thought process linear.  Good eye contact.  Depression screen [facility] 2/9 11/07/2019 06/24/2019 03/15/2019  Decreased Interest 2 2 2   Down, Depressed, Hopeless 2 2 2   PHQ - 2 Score 4 4 4   Altered sleeping 3 3 2   Tired, decreased energy 3 3 1   Change in appetite 2 1 1   Feeling bad or failure about yourself  2 3 3   Trouble concentrating  3 2 2   Moving slowly or fidgety/restless 1 0 0  Suicidal thoughts 1 1 0  PHQ-9 Score 19 17 13   Difficult doing work/chores Not difficult at all Not difficult at all Somewhat difficult  Some recent data might be hidden   GAD 7 : Generalized Anxiety Score 11/07/2019 06/24/2019 03/15/2019 11/25/2018  Nervous, Anxious, on Edge 2 3 2 1   Control/stop  worrying 3 2 2 1   Worry too much - different things 2 2 2 1   Trouble relaxing 1 1 1  0  Restless 1 2 0 1  Easily annoyed or irritable 0 0 1 0  Afraid - awful might happen 1 1 0 1  Total GAD 7 Score 10 11 8 5   Anxiety Difficulty Not difficult at all Not difficult at all - Somewhat difficult   Assessment/ Plan: 64 y.o. female   1. Generalized anxiety disorder Continues not to be controlled.  We discussed GI dosing of the BuSpar versus increasing the dose to 30 mg.  She would like to read into this further first.  Continue the Viibryd.-Narcotic database was reviewed and there were no red flags.  Controlled substance contract was updated today.  She is up-to-date on UDS.  We will see each other in 3 months, sooner if needed - ALPRAZolam (XANAX) 0.5 MG tablet; Take 0.5-1 tablets (0.25-0.5 mg total) by mouth daily as needed for anxiety. May take extra tablet during the day prn panic.  Dispense: 45 tablet; Refill: 5 - VIIBRYD 40 MG TABS; Take 1 tablet (40 mg total) by mouth daily.  Dispense: 90 tablet; Refill: 1  2. Depression, recurrent ([place]) As above  3. Psychophysiological insomnia As above - ALPRAZolam (XANAX) 0.5 MG tablet; Take 0.5-1 tablets (0.25-0.5 mg total) by mouth daily as needed for anxiety. May take extra tablet during the day prn panic.  Dispense: 45 tablet; Refill: 5  4. Controlled substance agreement signed  5. Acute cystitis with hematuria Urinalysis showed white blood cells, red blood cells and bacteria.  I reviewed her previous urine culture which showed Pseudomonas in the urine.  This was sensitive to cephalosporins.  Omnicef was  prescribed as she has intolerance to other oral antibiotics. - Urinalysis, Complete - Urine Culture - cefdinir (OMNICEF) 300 MG capsule; Take 1 capsule (300 mg total) by mouth 2 (two) times daily for 7 days. 1 po BID  Dispense: 14 capsule; Refill: 0  6. Essential hypertension Controlled.  Continue current regimen - losartan (COZAAR) 100 MG tablet; Take 1 tablet (100 mg total) by mouth daily.  Dispense: 90 tablet; Refill: 3  7. Facial swelling Still uncertain etiology and seems to be refractory to topicals being prescribed by dermatology.  Referral to allergy placed.  Check CBC with differential - Ambulatory referral to Allergy - CBC with Differential  8. Bronchospasm Lung exam was unremarkable.  I do question bronchospasm given reports of breathing sensation. ?  Eosinophilic reaction - Ambulatory referral to Allergy - CBC with Differential  9. Morbid obesity ([place]) I gave her the names of weight loss medication options.  She will follow-up with me within the next 3 months to select 1 of these.  In the interim, will check labs - CMP14+EGFR - Lipid Panel - Bayer DCA Hb A1c Waived - TSH  10. Hepatic cyst Noted incidentally on imaging study.    No orders of the defined types were placed in this encounter.  No orders of the defined types were placed in this encounter.    [name], DO [place] [phone]

## 2019-11-08 LAB — CBC WITH DIFFERENTIAL/PLATELET
Basophils Absolute: 0.1 10*3/uL (ref 0.0–0.2)
Basos: 1 %
EOS (ABSOLUTE): 0.2 10*3/uL (ref 0.0–0.4)
Eos: 3 %
Hematocrit: 41.7 % (ref 34.0–46.6)
Hemoglobin: 13.7 g/dL (ref 11.1–15.9)
Immature Grans (Abs): 0 10*3/uL (ref 0.0–0.1)
Immature Granulocytes: 0 %
Lymphocytes Absolute: 2.1 10*3/uL (ref 0.7–3.1)
Lymphs: 31 %
MCH: 29 pg (ref 26.6–33.0)
MCHC: 32.9 g/dL (ref 31.5–35.7)
MCV: 88 fL (ref 79–97)
Monocytes Absolute: 0.6 10*3/uL (ref 0.1–0.9)
Monocytes: 8 %
Neutrophils Absolute: 3.7 10*3/uL (ref 1.4–7.0)
Neutrophils: 57 %
Platelets: 275 10*3/uL (ref 150–450)
RBC: 4.73 x10E6/uL (ref 3.77–5.28)
RDW: 13.1 % (ref 11.7–15.4)
WBC: 6.6 10*3/uL (ref 3.4–10.8)

## 2019-11-08 LAB — CMP14+EGFR
ALT: 30 IU/L (ref 0–32)
AST: 26 IU/L (ref 0–40)
Albumin/Globulin Ratio: 1.7 (ref 1.2–2.2)
Albumin: 4.3 g/dL (ref 3.8–4.8)
Alkaline Phosphatase: 68 IU/L (ref 44–121)
BUN/Creatinine Ratio: 14 (ref 12–28)
BUN: 15 mg/dL (ref 8–27)
Bilirubin Total: 0.5 mg/dL (ref 0.0–1.2)
CO2: 23 mmol/L (ref 20–29)
Calcium: 9.6 mg/dL (ref 8.7–10.3)
Chloride: 105 mmol/L (ref 96–106)
Creatinine, Ser: 1.04 mg/dL — ABNORMAL HIGH (ref 0.57–1.00)
GFR calc Af Amer: 67 mL/min/{1.73_m2} (ref >59)
GFR calc non Af Amer: 58 mL/min/{1.73_m2} — ABNORMAL LOW (ref >59)
Globulin, Total: 2.5 g/dL (ref 1.5–4.5)
Glucose: 91 mg/dL (ref 65–99)
Potassium: 4.8 mmol/L (ref 3.5–5.2)
Sodium: 140 mmol/L (ref 134–144)
Total Protein: 6.8 g/dL (ref 6.0–8.5)

## 2019-11-08 LAB — LIPID PANEL
Chol/HDL Ratio: 3.6 ratio (ref 0.0–4.4)
Cholesterol, Total: 196 mg/dL (ref 100–199)
HDL: 55 mg/dL (ref >39)
LDL Chol Calc (NIH): 120 mg/dL — ABNORMAL HIGH (ref 0–99)
Triglycerides: 117 mg/dL (ref 0–149)
VLDL Cholesterol Cal: 21 mg/dL (ref 5–40)

## 2019-11-08 LAB — TSH: TSH: 1.19 u[IU]/mL (ref 0.450–4.500)

## 2019-11-08 MED ORDER — PANTOPRAZOLE SODIUM 40 MG PO TBEC
40.0000 mg | DELAYED_RELEASE_TABLET | Freq: Every day | ORAL | 0 refills | Status: DC
Start: 2019-11-08 — End: 2019-11-16

## 2019-11-08 NOTE — Addendum Note (Signed)
Addended by: Wardell Heath on: 11/08/2019 04:07 PM   Modules accepted: Orders

## 2019-11-10 LAB — URINE CULTURE

## 2019-11-14 ENCOUNTER — Encounter: Payer: Self-pay | Admitting: Family Medicine

## 2019-11-15 ENCOUNTER — Encounter: Payer: Self-pay | Admitting: Family Medicine

## 2019-11-16 ENCOUNTER — Other Ambulatory Visit: Payer: Self-pay | Admitting: Family Medicine

## 2019-11-16 MED ORDER — HYOSCYAMINE SULFATE 0.125 MG PO TABS: 0.1250 mg | ORAL_TABLET | Freq: Four times a day (QID) | ORAL | 0 refills | Status: DC | PRN

## 2019-11-23 NOTE — Progress Notes (Signed)
New Patient Note  RE: Sherri Howard MRN: [id number] DOB: January 03, 1956 Date of Office Visit: 11/24/2019  Referring provider: [name], DO Primary care provider: [name], DO  Chief Complaint: Facial Swelling (since beginning of the year with facial rash) and Allergies (nasal congestion and sneezing)  History of Present Illness: I had the pleasure of seeing Sherri Howard for initial evaluation at the [facility] [place] [facility] [place] on 11/24/2019. She is a 64 y.o. female, who is referred here by [name], DO for the evaluation of bronchospasm and facial swelling.  Rash: Rash/swelling started in March 2021. Mainly occurs on her forehead, nose and eyes. Describes them as itchy, red, swelling, raised. Associated symptoms include: watery eyes. Suspected triggers are unknown. Denies any fevers, chills, changes in medications, foods, personal care products or recent infections. She has tried the following therapies: topical steroid creams and zyrtec with some benefit. Systemic steroids: no as it caused convulsions in the past? Previous work up includes: saw dermatology, ophthalmology Previous history of rash/hives: yes mainly from medications and sun seems irritate the skin as well. Patient is up to date with the following cancer screening tests: physical exam, mammogram, colonoscopy.  Patient also has a CPAP machine which she had for many years and no change in March 2021.  Currently using regular laundry detergent and sometimes using dryer sheets.   Rhinitis: She reports symptoms of nasal congestion, sneezing, PND. Symptoms have been going on for many years. The symptoms are present all year around. Other triggers include exposure to none. Anosmia: no. Headache: sometimes. She has used zyrtec, Flonase with some improvement in symptoms. Sinus infections: none this year. Previous work up includes: none. Previous ENT evaluation: not recently. History of nasal polyps:  no. History of reflux: no.  Breathing:  She reports symptoms of shortness of breath, wheezing, nocturnal awakenings for 3-4 years. Current medications include none. She tried the following inhalers: none. Main triggers are activity but sometime it occurs without activity. In the last month, frequency of symptoms: 1-2 time a ay. Interference with physical activity: yes. Sleep is undisturbed. In the last 12 months, emergency room visits/urgent care visits/doctor office visits or hospitalizations due to respiratory issues: 0. In the last 12 months, oral steroids courses: 0. Lifetime history of hospitalization for respiratory issues: 0. Prior intubations: 0. History of pneumonia: no. She was not evaluated by allergist/pulmonologist in the past. Smoking exposure: denies. Up to date with flu vaccine: no. Up to date with pneumonia vaccine: yes. Up to date with COVID-19 vaccine: yes [place]. Patient has OSA and wears CPAP at night.   Vaccine/drug allergies: A few years ago patient had the flu shot and had some localized swelling with swelling in the nodes the following day. Patient has not taken the flu shot prior to that. Symptoms resolved after taking antibiotics.  No issues with other vaccines.   Prednisolone - in 2017 patient took a few days of prednisolone. She was not feeling well that morning but patient's husband thought it looked like she had convulsions. She is not sure why she was on prednisolone.   Hydroxyzine - patient was given hydroxyzine prior to a scheduled tonsillectomy and apparently she had convulsions while she was under anesthesia. Patient had various surgeries since then with no issues.   11/07/2019 PCP OV visit: "2.  Rash Ongoing facial swelling and rash.  She has been using a topical prescribed by Dr. Nevada Crane to the forehead.  The eye swelling seems to  be improving some but is still quite present.  She reports intermittent bronchospasm, that she describes as difficulty getting a good  breath in.  Denies overt dyspnea or change in activity tolerance.  She would like to see an allergist if possible."  Assessment and Plan: Darthula is a 64 y.o. female with: Pruritic rash Pruritic facial rash started in March 2021.  No triggers noted.  Tried topical steroid creams and Zyrtec with some benefit.  Evaluated by dermatology and ophthalmology as well.  Oral prednisone caused convulsions in the past?  Patient concerned for possible allergic triggers.  Today's skin testing showed: Negative to indoor/outdoor allergies and common foods.   Discussed with patient that based on distribution it is concerning for contact dermatitis.   Will get some bloodwork to rule out any other etiologies. CMP, CBC, TSH, ANA, Crp, ESR were unremarkable in the past.   See below for proper skin care - use fragrance free and dye free products.  Start zyrtec 2m daily.  Recommend patch testing next. Patient will check with insurance about cost prior to scheduling.   [place].  May use Eucrisa (crisaborole) 2% ointment twice a day on mild eczema flares on the face and body. This is a non-steroid ointment. Call if you need a prescription for this. Samples given. If it burns, place the medication in the refrigerator.  Apply a thin layer of moisturizer and then apply the Eucrisa on top of it.  Chronic rhinitis Perennial rhinitis symptoms for many years.  Tried Zyrtec and Flonase with some benefit.  No prior allergy evaluation.  Today's skin testing showed: Negative to indoor/outdoor allergies.  She most likely has non-allergic rhinitis.  May use Flonase (fluticasone) nasal spray 1 spray per nostril twice a day as needed for nasal congestion.   Nasal saline spray (i.e., Simply Saline) or nasal saline lavage (i.e., NeilMed) is recommended as needed and prior to medicated nasal sprays.  Dyspnea Episodes of  dyspnea mainly with activity for the past 3-4 years. Occasionally hears a wheeze and nocturnal awakenings. Patient has OSA and wear CPAP at night.   Today's spirometry was normal.  Dyspnea is most likely due to physical deconditioning.  Encourage physical activity and weight loss.  Monitor symptoms.   Multiple drug allergies Patient has a long list of reactions to various medications mainly in the form of rash. Apparently prednisolone and hydroxyzine cause convulsions in the past. Large localized reaction with flu vaccine in the past.  Continue to avoid drugs on allergy list.  Recommend drug challenges in the future once rash improves to see if patient truly is allergic to these drugs on the list.   Okay to get COVID-19 booster as she tolerated her 2 Pfizer doses with no issues.   Return for Patch testing.  Meds ordered this encounter  Medications  . fluticasone (FLONASE) 50 MCG/ACT nasal spray    Sig: Place 1 spray into both nostrils 2 (two) times daily as needed for allergies or rhinitis (nasal congestion).    Dispense:  16 g    Refill:  5    Lab Orders     Tryptase     Alpha-Gal Panel     Chronic Urticaria  Other allergy screening: Food allergy: no Hymenoptera allergy: no History of recurrent infections suggestive of immunodeficency: no  Diagnostics: Spirometry:  Tracings reviewed. Her effort: Good reproducible efforts. FVC: 2.76L FEV1: 2.14L,  83% predicted FEV1/FVC ratio: 78% Interpretation: Spirometry consistent with normal pattern.  Please see scanned spirometry results for details.  Skin Testing: Environmental allergy panel and select foods. Negative to indoor/outdoor allergies and common foods.  Results discussed with patient/family.  Airborne Adult Perc - 11/24/19 1011    Time Antigen Placed 1012    Allergen Manufacturer Lavella Hammock    Location Back    Number of Test 59    1. Control-Buffer 50% Glycerol Negative    2. Control-Histamine 1 mg/ml 2+    3.  Albumin saline Negative    4. [place] Negative    5. [place] Negative    6. Johnson Negative    7. Schulter Blue Negative    8. Meadow Fescue Negative    9. Perennial Rye Negative    10. Sweet Vernal Negative    11. Timothy Negative    12. Cocklebur Negative    13. Burweed Marshelder Negative    14. Ragweed, short Negative    15. Ragweed, Giant Negative    16. Plantain,  English Negative    17. Lamb's Quarters Negative    18. Sheep Sorrell Negative    19. Rough Pigweed Negative    20. Marsh Elder, Rough Negative    21. Mugwort, Common Negative    22. Ash mix Negative    23. Birch mix Negative    24. Beech American Negative    25. Box, Elder Negative    26. Cedar, red Negative    27. Cottonwood, [place] Negative    28. Elm mix Negative    29. Hickory Negative    30. Maple mix Negative    31. Oak, [place] mix Negative    32. Pecan Pollen Negative    33. Pine mix Negative    34. Sycamore Eastern Negative    35. Repton, Black Pollen Negative    36. Alternaria alternata Negative    37. Cladosporium Herbarum Negative    38. Aspergillus mix Negative    39. Penicillium mix Negative    40. Bipolaris sorokiniana (Helminthosporium) Negative    41. Drechslera spicifera (Curvularia) Negative    42. Mucor plumbeus Negative    43. Fusarium moniliforme Negative    44. Aureobasidium pullulans (pullulara) Negative    45. Rhizopus oryzae Negative    46. Botrytis cinera Negative    47. Epicoccum nigrum Negative    48. Phoma betae Negative    49. Candida Albicans Negative    50. Trichophyton mentagrophytes Negative    51. Mite, D Farinae  5,000 AU/ml Negative    52. Mite, D Pteronyssinus  5,000 AU/ml Negative    53. Cat Hair 10,000 BAU/ml Negative    54.  Dog Epithelia Negative    55. Mixed Feathers Negative    56. Horse Epithelia Negative    57. Cockroach, German Negative    58. Mouse Negative    59. Tobacco Leaf Negative          Food Perc - 11/24/19 1012      Test  Information   Time Antigen Placed 1012    Allergen Manufacturer Lavella Hammock    Location Back    Number of allergen test 10      Food   1. Peanut Negative    2. Soybean food Negative    3. Wheat, whole Negative    4. Sesame Negative    5. Milk, cow Negative    6. Egg White, chicken Negative    7. Casein Negative  8. Shellfish mix Negative    9. Fish mix Negative    10. Cashew Negative           Past Medical History: Patient Active Problem List   Diagnosis Date Noted  . Pruritic rash 11/24/2019  . Pruritus 11/24/2019  . Chronic rhinitis 11/24/2019  . Multiple drug allergies 11/24/2019  . Mild obesity 08/13/2016  . Encounter for screening colonoscopy 07/17/2016  . Depression, recurrent ([place]) 02/28/2016  . Pyelonephritis 11/09/2015  . Right ureteral calculus 11/07/2015  . Encounter for opiate analgesic use agreement 09/20/2015  . Pain medication agreement signed 09/20/2015  . Low back pain 08/24/2015  . Fatigue 08/09/2015  . Palpitations 06/26/2015  . Hydronephrosis 06/18/2015  . Abdominal pain 03/02/2015  . Dyspnea 03/02/2015  . Benign essential hypertension 12/20/2014  . Chronic recurrent major depressive disorder ([place]) 12/20/2014  . Dyslipidemia 12/20/2014  . Generalized anxiety disorder 12/20/2014  . Irritable bowel syndrome 12/20/2014  . Morbid obesity with BMI of 40.0-44.9, adult ([place]) 12/20/2014  . OSA on CPAP 12/20/2014  . Lactose intolerance 12/20/2014  . Insomnia 12/20/2014  . Vitamin D deficiency 12/20/2014  . Urinary incontinence 12/20/2014  . Other specified postprocedural states 07/31/2014  . S/P herniorrhaphy 07/31/2014  . Ventral incisional hernia 06/16/2014  . History of colectomy 09/07/2012  . Adenomatous colon polyp 07/21/2012  . History of renal calculi 07/21/2012   Past Medical History:  Diagnosis Date  . Anxiety   . Depression   . Hyperlipemia   . Hypertension   . Kidney stones   . Seizure ([place])   . Sleep apnea    Past Surgical  History: Past Surgical History:  Procedure Laterality Date  . APPENDECTOMY    . CHOLECYSTECTOMY    . HERNIA REPAIR    . OVARIAN CYST SURGERY    . TONSILLECTOMY  05/1973   Medication List:  Current Outpatient Medications  Medication Sig Dispense Refill  . acetaminophen (TYLENOL) 325 MG tablet Take 650 mg by mouth every 6 (six) hours as needed for mild pain or moderate pain.     Marland Kitchen ALPRAZolam (XANAX) 0.5 MG tablet Take 0.5-1 tablets (0.25-0.5 mg total) by mouth daily as needed for anxiety. May take extra tablet during the day prn panic. 45 tablet 5  . busPIRone (BUSPAR) 10 MG tablet Take 2 tablets (20 mg total) by mouth daily. (Patient taking differently: Take 20 mg by mouth daily. FOR ANXIETY) 360 tablet 1  . CARTIA XT 240 MG 24 hr capsule TAKE 1 CAPSULE DAILY (Patient taking differently: Take 240 mg by mouth daily. "DILTIAZEM") 90 capsule 3  . hyoscyamine (LEVSIN) 0.125 MG tablet Take 1 tablet (0.125 mg total) by mouth every 6 (six) hours as needed (USE SPARINGLY (refills per GI)). 30 tablet 0  . losartan (COZAAR) 100 MG tablet Take 1 tablet (100 mg total) by mouth daily. 90 tablet 3  . VIIBRYD 40 MG TABS Take 1 tablet (40 mg total) by mouth daily. (Patient taking differently: Take 40 mg by mouth daily. FOR DEPRESSION) 90 tablet 1  . fluticasone (FLONASE) 50 MCG/ACT nasal spray Place 1 spray into both nostrils 2 (two) times daily as needed for allergies or rhinitis (nasal congestion). 16 g 5   No current facility-administered medications for this visit.   Allergies: Allergies  Allergen Reactions  . Ciprofloxacin Rash  . Clindamycin Rash    Reaction unknown  . Influenza Vaccines Rash  . Latex Rash  . Other Rash    Bandages,etc.  . Sulfur Rash  Duplicate   . Temazepam Rash  . Enablex [Darifenacin Hydrobromide Er] Swelling    Causes swelling of patient's tongue  . Hydroxyzine Hcl     Anesthetic when had tonsillectomy. Caused convulsions.  . Prednisolone Other (See Comments)     Convulsions per pt  . Sulfa Antibiotics   . Amoxicillin Diarrhea  . Oxycodone-Acetaminophen Rash    Percocet  . Pantoprazole Rash  . Pneumococcal Vaccine Swelling and Rash    Lymph node swelling   Social History: Social History   Socioeconomic History  . Marital status: Married    Spouse name: Not on file  . Number of children: 1  . Years of education: Not on file  . Highest education level: Not on file  Occupational History  . Not on file  Tobacco Use  . Smoking status: Never Smoker  . Smokeless tobacco: Never Used  Vaping Use  . Vaping Use: Never used  Substance and Sexual Activity  . Alcohol use: No  . Drug use: No  . Sexual activity: Not on file  Other Topics Concern  . Not on file  Social History Narrative  . Not on file   Social Determinants of Health   Financial Resource Strain:   . Difficulty of Paying Living Expenses: Not on file  Food Insecurity:   . Worried About Charity fundraiser in the Last Year: Not on file  . Ran Out of Food in the Last Year: Not on file  Transportation Needs:   . Lack of Transportation (Medical): Not on file  . Lack of Transportation (Non-Medical): Not on file  Physical Activity:   . Days of Exercise per Week: Not on file  . Minutes of Exercise per Session: Not on file  Stress:   . Feeling of Stress : Not on file  Social Connections:   . Frequency of Communication with Friends and Family: Not on file  . Frequency of Social Gatherings with Friends and Family: Not on file  . Attends Religious Services: Not on file  . Active Member of Clubs or Organizations: Not on file  . Attends Archivist Meetings: Not on file  . Marital Status: Not on file   Lives in a house built in 1997. Smoking: denies Occupation: not employed  Environmental HistoryFreight forwarder in the house: yes Carpet in the family room: no Carpet in the bedroom: no Heating: electric Cooling: central Pet: no  Family History: Family History   Problem Relation Age of Onset  . Cancer Mother   . Aneurysm Mother   . Allergic rhinitis Mother   . Cancer Father   . Hypertension Father   . Alzheimer's disease Father   . Allergic rhinitis Brother   . COPD Maternal Aunt   . Eczema Son   . Bronchitis Son   . Sinusitis Son   . Asthma Neg Hx   . Urticaria Neg Hx   . Immunodeficiency Neg Hx   . Atopy Neg Hx   . Angioedema Neg Hx    Review of Systems  Constitutional: Negative for appetite change, chills, fever and unexpected weight change.  HENT: Positive for congestion, postnasal drip and sneezing. Negative for rhinorrhea.   Eyes: Negative for itching.  Respiratory: Positive for shortness of breath and wheezing. Negative for cough and chest tightness.   Cardiovascular: Negative for chest pain.  Gastrointestinal: Positive for abdominal pain.  Genitourinary: Negative for difficulty urinating.  Skin: Positive for rash.  Allergic/Immunologic: Negative for environmental allergies and food allergies.  Neurological: Negative for headaches.   Objective: BP 136/78   Pulse 74   Temp (!) 97.4 F (36.3 C) (Temporal)   Resp 16   Ht _0  (1.651 m)   Wt 277 lb (125.6 kg)   SpO2 96%   BMI 46.10 kg/m  Body mass index is 46.1 kg/m. Physical Exam Vitals and nursing note reviewed.  Constitutional:      Appearance: Normal appearance. She is well-developed.  HENT:     Head: Normocephalic and atraumatic.     Right Ear: External ear normal.     Left Ear: External ear normal.     Nose: Nose normal.     Mouth/Throat:     Mouth: Mucous membranes are moist.     Pharynx: Oropharynx is clear.  Eyes:     Conjunctiva/sclera: Conjunctivae normal.  Cardiovascular:     Rate and Rhythm: Normal rate and regular rhythm.     Heart sounds: Normal heart sounds. No murmur heard.  No friction rub. No gallop.   Pulmonary:     Effort: Pulmonary effort is normal.     Breath sounds: Normal breath sounds. No wheezing, rhonchi or rales.  Abdominal:      Palpations: Abdomen is soft.  Musculoskeletal:     Cervical back: Neck supple.  Skin:    General: Skin is warm.     Findings: No rash.  Neurological:     Mental Status: She is alert and oriented to person, place, and time.  Psychiatric:        Behavior: Behavior normal.    The plan was reviewed with the patient/family, and all questions/concerned were addressed.  It was my pleasure to see Azana today and participate in her care. Please feel free to contact me with any questions or concerns.  Sincerely,  Rexene Alberts, DO Allergy & Immunology  [facility] office: [place] office: [phone]

## 2019-11-24 ENCOUNTER — Ambulatory Visit (INDEPENDENT_AMBULATORY_CARE_PROVIDER_SITE_OTHER): Payer: BC Managed Care – PPO | Admitting: Allergy

## 2019-11-24 ENCOUNTER — Encounter: Payer: Self-pay | Admitting: Allergy

## 2019-11-24 ENCOUNTER — Other Ambulatory Visit: Payer: Self-pay

## 2019-11-24 VITALS — BP 136/78 | HR 74 | Temp 97.4°F | Resp 16 | Ht 65.0 in | Wt 277.0 lb

## 2019-11-24 DIAGNOSIS — R0602 Shortness of breath: Secondary | ICD-10-CM | POA: Diagnosis not present

## 2019-11-24 DIAGNOSIS — L299 Pruritus, unspecified: Secondary | ICD-10-CM

## 2019-11-24 DIAGNOSIS — L282 Other prurigo: Secondary | ICD-10-CM

## 2019-11-24 DIAGNOSIS — J310 Chronic rhinitis: Secondary | ICD-10-CM | POA: Diagnosis not present

## 2019-11-24 DIAGNOSIS — Z889 Allergy status to unspecified drugs, medicaments and biological substances status: Secondary | ICD-10-CM | POA: Insufficient documentation

## 2019-11-24 MED ORDER — FLUTICASONE PROPIONATE 50 MCG/ACT NA SUSP: 1.0000 | Freq: Two times a day (BID) | NASAL | 5 refills | Status: DC | PRN

## 2019-11-24 NOTE — Assessment & Plan Note (Signed)
Perennial rhinitis symptoms for many years.  Tried Zyrtec and Flonase with some benefit.  No prior allergy evaluation.  Today's skin testing showed: Negative to indoor/outdoor allergies.  She most likely has non-allergic rhinitis.  May use Flonase (fluticasone) nasal spray 1 spray per nostril twice a day as needed for nasal congestion.   Nasal saline spray (i.e., Simply Saline) or nasal saline lavage (i.e., NeilMed) is recommended as needed and prior to medicated nasal sprays.

## 2019-11-24 NOTE — Patient Instructions (Addendum)
Today's skin testing showed: Negative to indoor/outdoor allergies and common foods.   Rash:  See below for proper skin care - use fragrance free and dye free products.  Start zyrtec 10mg  daily.  Recommend patch testing next.   Patches are best placed on Monday with return to office on Wednesday and Friday of same week for readings.  Patches once placed should not get wet.  You do not have to stop any medications for patch testing but should not be on oral prednisone. You can schedule a patch testing visit when convenient for your schedule.     Come to [facility] for patch placement on a Monday and then follow up in [place] for patch reading on Wednesday and Friday.  May use Eucrisa (crisaborole) 2% ointment twice a day on mild eczema flares on the face and body. This is a non-steroid ointment. Call if you need a prescription for this.  If it burns, place the medication in the refrigerator.  Apply a thin layer of moisturizer and then apply the Eucrisa on top of it.  CPT code for patch testing is 95044 and we would do 36 different chemical testing.   Rhinitis:  You most likely have non-allergic rhinitis.  May use Flonase (fluticasone) nasal spray 1 spray per nostril twice a day as needed for nasal congestion.   Nasal saline spray (i.e., Simply Saline) or nasal saline lavage (i.e., NeilMed) is recommended as needed and prior to medicated nasal sprays.  Breathing:  Your breathing test today was normal.  Symptom most likely due to physical deconditioning.  Encourage physical activity and weight loss.  Drug allergies:  Avoid drugs on your allergy list.  Recommend drug challenges in the future once your rash is improved to see if you are really allergic to these drugs on your list.   Okay to get COVID-19 booster.  Follow up for patch testing.    Skin care recommendations  Bath time: . Always use lukewarm water. AVOID very hot or cold water. Marland Kitchen Keep bathing time to 5-10  minutes. . Do NOT use bubble bath. . Use a mild soap and use just enough to wash the dirty areas. . Do NOT scrub skin vigorously.  . After bathing, pat dry your skin with a towel. Do NOT rub or scrub the skin.  Moisturizers and prescriptions:  . ALWAYS apply moisturizers immediately after bathing (within 3 minutes). This helps to lock-in moisture. . Use the moisturizer several times a day over the whole body. Kermit Balo summer moisturizers include: Aveeno, CeraVe, Cetaphil. Kermit Balo winter moisturizers include: Aquaphor, Vaseline, Cerave, Cetaphil, Eucerin, Vanicream. . When using moisturizers along with medications, the moisturizer should be applied about one hour after applying the medication to prevent diluting effect of the medication or moisturize around where you applied the medications. When not using medications, the moisturizer can be continued twice daily as maintenance.  Laundry and clothing: . Avoid laundry products with added color or perfumes. . Use unscented hypo-allergenic laundry products such as Tide free, Cheer free & gentle, and All free and clear.  . If the skin still seems dry or sensitive, you can try double-rinsing the clothes. . Avoid tight or scratchy clothing such as wool. . Do not use fabric softeners or dyer sheets.

## 2019-11-24 NOTE — Assessment & Plan Note (Addendum)
Pruritic facial rash started in March 2021.  No triggers noted.  Tried topical steroid creams and Zyrtec with some benefit.  Evaluated by dermatology and ophthalmology as well.  Oral prednisone caused convulsions in the past?  Patient concerned for possible allergic triggers.  Today's skin testing showed: Negative to indoor/outdoor allergies and common foods.   Discussed with patient that based on distribution it is concerning for contact dermatitis.   Will get some bloodwork to rule out any other etiologies. CMP, CBC, TSH, ANA, Crp, ESR were unremarkable in the past.   See below for proper skin care - use fragrance free and dye free products.  Start zyrtec 41m daily.  Recommend patch testing next. Patient will check with insurance about cost prior to scheduling.   [place]  May use Eucrisa (crisaborole) 2% ointment twice a day on mild eczema flares on the face and body. This is a non-steroid ointment. Call if you need a prescription for this. Samples given. If it burns, place the medication in the refrigerator.  Apply a thin layer of moisturizer and then apply the Eucrisa on top of it.

## 2019-11-24 NOTE — Assessment & Plan Note (Signed)
Episodes of dyspnea mainly with activity for the past 3-4 years. Occasionally hears a wheeze and nocturnal awakenings. Patient has OSA and wear CPAP at night.   Today's spirometry was normal.  Dyspnea is most likely due to physical deconditioning.  Encourage physical activity and weight loss.  Monitor symptoms.

## 2019-11-24 NOTE — Assessment & Plan Note (Signed)
Patient has a long list of reactions to various medications mainly in the form of rash. Apparently prednisolone and hydroxyzine cause convulsions in the past. Large localized reaction with flu vaccine in the past.  Continue to avoid drugs on allergy list.  Recommend drug challenges in the future once rash improves to see if patient truly is allergic to these drugs on the list.   Okay to get COVID-19 booster as she tolerated her 2 Pfizer doses with no issues.

## 2019-11-25 ENCOUNTER — Other Ambulatory Visit: Payer: BC Managed Care – PPO

## 2019-11-25 DIAGNOSIS — L282 Other prurigo: Secondary | ICD-10-CM | POA: Diagnosis not present

## 2019-11-25 DIAGNOSIS — L299 Pruritus, unspecified: Secondary | ICD-10-CM | POA: Diagnosis not present

## 2019-11-29 ENCOUNTER — Ambulatory Visit (INDEPENDENT_AMBULATORY_CARE_PROVIDER_SITE_OTHER): Payer: BC Managed Care – PPO

## 2019-11-29 ENCOUNTER — Other Ambulatory Visit: Payer: Self-pay

## 2019-11-29 DIAGNOSIS — Z23 Encounter for immunization: Secondary | ICD-10-CM

## 2019-12-01 ENCOUNTER — Encounter: Payer: Self-pay | Admitting: Family Medicine

## 2019-12-01 ENCOUNTER — Encounter: Payer: Self-pay | Admitting: Allergy

## 2019-12-06 NOTE — Telephone Encounter (Signed)
Will need to follow up on this  

## 2019-12-06 NOTE — Telephone Encounter (Signed)
Patient called back today, 12/06/19, checking on the blood work. She would also like to know what she was tested for with the blood work.

## 2019-12-07 ENCOUNTER — Encounter: Payer: Self-pay | Admitting: Family Medicine

## 2019-12-12 ENCOUNTER — Ambulatory Visit (INDEPENDENT_AMBULATORY_CARE_PROVIDER_SITE_OTHER): Payer: BC Managed Care – PPO | Admitting: Family

## 2019-12-12 ENCOUNTER — Encounter: Payer: Self-pay | Admitting: Family

## 2019-12-12 ENCOUNTER — Other Ambulatory Visit: Payer: Self-pay

## 2019-12-12 DIAGNOSIS — L259 Unspecified contact dermatitis, unspecified cause: Secondary | ICD-10-CM | POA: Diagnosis not present

## 2019-12-12 NOTE — Progress Notes (Signed)
Follow-up Note  RE: Sherri Howard MRN: [id number] DOB: 09-Sep-1955 Date of Office Visit: 12/12/2019  Primary care provider: [name], DO Referring provider: [name], DO   [name] returns to the office today for the patch test placement, given suspected history of contact dermatitis.    Diagnostics: True Test patches placed.    Plan:   Allergic contact dermatitis - Instructions provided on care of the patches for the next 48 hours. - [name] was instructed to avoid showering for the next 48 hours. [name] will follow up in 48 hours and 96 hours for patch readings.   Thank you for the opportunity to care for [name]. Please do not hesitate to contact us with any questions.  [name], FNP [facility]

## 2019-12-13 ENCOUNTER — Telehealth: Payer: Self-pay

## 2019-12-13 ENCOUNTER — Other Ambulatory Visit: Payer: Self-pay | Admitting: Family Medicine

## 2019-12-13 NOTE — Telephone Encounter (Signed)
Spoke with Tesh in the lab, she states that since this labwork was ordered by Dr.Kim's office and done here as a courtesy only that we would not be able to see the results.  Patient should contact Dr. Julianne Rice office for results.  Patient informed.

## 2019-12-13 NOTE — Telephone Encounter (Signed)
Patient called this morning to check on her blood work.

## 2019-12-14 ENCOUNTER — Other Ambulatory Visit: Payer: Self-pay

## 2019-12-14 ENCOUNTER — Encounter: Payer: Self-pay | Admitting: Allergy

## 2019-12-14 ENCOUNTER — Ambulatory Visit: Payer: BC Managed Care – PPO | Admitting: Allergy

## 2019-12-14 DIAGNOSIS — L259 Unspecified contact dermatitis, unspecified cause: Secondary | ICD-10-CM

## 2019-12-14 DIAGNOSIS — L282 Other prurigo: Secondary | ICD-10-CM

## 2019-12-14 NOTE — Progress Notes (Signed)
° ° °  Follow-up Note  RE: Sherri Howard MRN: [id number] DOB: Jan 11, 1956 Date of Office Visit: 12/14/2019  Primary care provider: [name], DO Referring provider: [name], DO   [name] returns to the office today for the initial patch test interpretation, given suspected history of contact dermatitis.    Diagnostics:  TRUE TEST 48 hour reading: negative  Plan:  Pt will return in 2 days for final patch reading.  Prudy Feeler, MD [facility]

## 2019-12-14 NOTE — Telephone Encounter (Signed)
Pt has it on hand, doesn't know at times when she needs to use it Will try this refill see how it goes, then schedule an appt as recommended

## 2019-12-14 NOTE — Telephone Encounter (Signed)
If she is requiring frequently, I'd like to see her, virtual ok.  Maybe a candidate for Viberzi?

## 2019-12-16 ENCOUNTER — Encounter: Payer: BC Managed Care – PPO | Admitting: Allergy & Immunology

## 2019-12-16 ENCOUNTER — Ambulatory Visit (INDEPENDENT_AMBULATORY_CARE_PROVIDER_SITE_OTHER): Payer: BC Managed Care – PPO | Admitting: Allergy

## 2019-12-16 ENCOUNTER — Other Ambulatory Visit: Payer: Self-pay

## 2019-12-16 ENCOUNTER — Encounter: Payer: Self-pay | Admitting: Allergy

## 2019-12-16 DIAGNOSIS — L282 Other prurigo: Secondary | ICD-10-CM | POA: Diagnosis not present

## 2019-12-16 DIAGNOSIS — Z91018 Allergy to other foods: Secondary | ICD-10-CM | POA: Diagnosis not present

## 2019-12-16 DIAGNOSIS — T781XXD Other adverse food reactions, not elsewhere classified, subsequent encounter: Secondary | ICD-10-CM | POA: Diagnosis not present

## 2019-12-16 DIAGNOSIS — L258 Unspecified contact dermatitis due to other agents: Secondary | ICD-10-CM

## 2019-12-16 DIAGNOSIS — L509 Urticaria, unspecified: Secondary | ICD-10-CM | POA: Diagnosis not present

## 2019-12-16 LAB — ALPHA-GAL PANEL

## 2019-12-16 LAB — TRYPTASE: Tryptase: 7.6 ug/L (ref 2.2–13.2)

## 2019-12-16 LAB — CHRONIC URTICARIA

## 2019-12-16 NOTE — Progress Notes (Signed)
    Follow-up Note  RE: Sherri Howard MRN: [id number] DOB: 1955-05-06 Date of Office Visit: 12/16/2019  Primary care provider: [name], DO Referring provider: [name], DO   Sherri Howard returns to the office today for the final patch test interpretation, given suspected history of contact dermatitis.    Diagnostics:  TRUE TEST 96 hour reading: #28 gold sodium thiosulfate 2+ with erythematous papule  TRUE TEST 48 hour reading: negative  Plan:  Allergic contact dermatitis  The patient has been provided detailed information regarding the substances she is sensitive to, as well as products containing the substances.  Meticulous avoidance of these substances is recommended. If avoidance is not possible, the use of barrier creams or lotions is recommended.  Product safe list emailed to [email or web address]  [id number]$, MD [facility]

## 2019-12-19 LAB — ALPHA-GAL PANEL

## 2019-12-21 LAB — ALPHA-GAL PANEL

## 2019-12-27 ENCOUNTER — Encounter: Payer: Self-pay | Admitting: Allergy

## 2019-12-30 LAB — ALPHA-GAL PANEL
Alpha Gal IgE*: 13.7 kU/L — ABNORMAL HIGH (ref <0.10)
Beef (Bos spp) IgE: 3.57 kU/L — ABNORMAL HIGH (ref <0.35)
Class Interpretation: 2
Class Interpretation: 2
Class Interpretation: 3
Lamb/Mutton (Ovis spp) IgE: 0.76 kU/L — ABNORMAL HIGH (ref <0.35)
Pork (Sus spp) IgE: 1.04 kU/L — ABNORMAL HIGH (ref <0.35)

## 2019-12-30 LAB — CHRONIC URTICARIA: cu index: <3.1 (ref <10)

## 2020-01-02 ENCOUNTER — Telehealth: Payer: Self-pay

## 2020-01-02 NOTE — Telephone Encounter (Signed)
Patient called to check on the status of her lab result.   Please advise.

## 2020-01-02 NOTE — Telephone Encounter (Signed)
Spoke to patient and she wanted lab results, it states in out system that the labs were sent to another facility for more testing, but [employer] [facility] [facility] did fax over the results and I sent them to [facility] so Dr. Nelva Bush and red them and notate patients chart. I also called I and informed the patient of the process.

## 2020-01-10 ENCOUNTER — Encounter: Payer: Self-pay | Admitting: Allergy

## 2020-01-11 ENCOUNTER — Telehealth: Payer: Self-pay

## 2020-01-11 NOTE — Telephone Encounter (Signed)
Pt received automated call stating that she is due for a mammogram. Pt says she has to have the 3D Mammogram. Needs more info on who she needs to call to get one scheduled.

## 2020-01-12 ENCOUNTER — Other Ambulatory Visit: Payer: Self-pay | Admitting: Family Medicine

## 2020-01-12 ENCOUNTER — Encounter: Payer: Self-pay | Admitting: Family Medicine

## 2020-01-12 DIAGNOSIS — F411 Generalized anxiety disorder: Secondary | ICD-10-CM

## 2020-01-12 DIAGNOSIS — I10 Essential (primary) hypertension: Secondary | ICD-10-CM

## 2020-01-12 MED ORDER — BUSPIRONE HCL 10 MG PO TABS: 20.0000 mg | ORAL_TABLET | Freq: Every day | ORAL | 0 refills | Status: DC

## 2020-01-12 NOTE — Telephone Encounter (Signed)
Appt made for patient's mammo

## 2020-01-26 ENCOUNTER — Encounter: Payer: Self-pay | Admitting: Allergy

## 2020-02-01 ENCOUNTER — Other Ambulatory Visit: Payer: Self-pay | Admitting: Family Medicine

## 2020-02-01 DIAGNOSIS — Z1231 Encounter for screening mammogram for malignant neoplasm of breast: Secondary | ICD-10-CM

## 2020-02-03 ENCOUNTER — Other Ambulatory Visit: Payer: Self-pay | Admitting: Family Medicine

## 2020-02-03 DIAGNOSIS — F411 Generalized anxiety disorder: Secondary | ICD-10-CM

## 2020-02-03 MED ORDER — BUSPIRONE HCL 10 MG PO TABS: 20.0000 mg | ORAL_TABLET | Freq: Two times a day (BID) | ORAL | 3 refills | Status: DC

## 2020-02-23 ENCOUNTER — Telehealth (INDEPENDENT_AMBULATORY_CARE_PROVIDER_SITE_OTHER): Payer: BC Managed Care – PPO | Admitting: Family Medicine

## 2020-02-23 ENCOUNTER — Encounter: Payer: Self-pay | Admitting: Family Medicine

## 2020-02-23 DIAGNOSIS — I10 Essential (primary) hypertension: Secondary | ICD-10-CM

## 2020-02-23 MED ORDER — HYDROCHLOROTHIAZIDE 25 MG PO TABS: 25.0000 mg | ORAL_TABLET | Freq: Every day | ORAL | 2 refills | Status: DC

## 2020-02-23 NOTE — Progress Notes (Signed)
Virtual Visit via mychart video Note  I connected with Sherri Howard on 02/23/20 at 1510 by video and verified that I am speaking with the correct person using two identifiers. Sherri Howard is currently located at home and patient are currently with her during visit. The provider, Fransisca Kaufmann Jenaye Rickert, MD is located in their office at time of visit.  Call ended at 1522  I discussed the limitations, risks, security and privacy concerns of performing an evaluation and management service by video and the availability of in person appointments. I also discussed with the patient that there may be a patient responsible charge related to this service. The patient expressed understanding and agreed to proceed.   History and Present Illness: Patient is calling in for anxiousness and elevated blood pressure.  163/80 on 02/14/20.  Then BP was 149/89. This AM bp was 151/92 and repeated and went 144/99. At 2pm today she was having ringing in ears and BP 152/95. She denies headaches or chest pain.   No diagnosis found.  Outpatient Encounter Medications as of 02/23/2020  Medication Sig  . acetaminophen (TYLENOL) 325 MG tablet Take 650 mg by mouth every 6 (six) hours as needed for mild pain or moderate pain.   Marland Kitchen ALPRAZolam (XANAX) 0.5 MG tablet Take 0.5-1 tablets (0.25-0.5 mg total) by mouth daily as needed for anxiety. May take extra tablet during the day prn panic.  . busPIRone (BUSPAR) 10 MG tablet Take 2 tablets (20 mg total) by mouth 2 (two) times daily. FOR ANXIETY  . diltiazem (CARDIZEM CD) 240 MG 24 hr capsule TAKE 1 CAPSULE DAILY  . fluticasone (FLONASE) 50 MCG/ACT nasal spray Place 1 spray into both nostrils 2 (two) times daily as needed for allergies or rhinitis (nasal congestion).  . hyoscyamine (LEVSIN SL) 0.125 MG SL tablet Take 1 tablet (0.125 mg total) by mouth every 6 (six) hours as needed (schedule OV for IBS-D if needing frequently).  Marland Kitchen losartan (COZAAR) 100 MG tablet Take 1 tablet (100 mg  total) by mouth daily.  Marland Kitchen VIIBRYD 40 MG TABS Take 1 tablet (40 mg total) by mouth daily. (Patient taking differently: Take 40 mg by mouth daily. FOR DEPRESSION)   No facility-administered encounter medications on file as of 02/23/2020.    Review of Systems  Constitutional: Negative for chills and fever.  HENT: Positive for tinnitus.   Eyes: Negative for visual disturbance.  Respiratory: Negative for chest tightness and shortness of breath.   Cardiovascular: Negative for chest pain, palpitations and leg swelling.  Genitourinary: Negative for difficulty urinating and dysuria.  Musculoskeletal: Negative for back pain and gait problem.  Skin: Negative for rash.  Neurological: Positive for headaches. Negative for dizziness and light-headedness.  Psychiatric/Behavioral: Negative for agitation and behavioral problems.  All other systems reviewed and are negative.   Observations/Objective: Patient sounds comfortable and in no acute distress  Assessment and Plan: Problem List Items Addressed This Visit      Cardiovascular and Mediastinum   Benign essential hypertension - Primary   Relevant Medications   hydrochlorothiazide (HYDRODIURIL) 25 MG tablet      Patient getting elevated blood pressures at home, will add hydrochlorothiazide and follow-up with PCP in 3 to 4 weeks for BMP and recheck on blood pressure. Follow up plan: Return in about 4 weeks (around 03/22/2020), or if symptoms worsen or fail to improve, for pcp f/u hypertension and labs.     I discussed the assessment and treatment plan with the patient. The patient  was provided an opportunity to ask questions and all were answered. The patient agreed with the plan and demonstrated an understanding of the instructions.   The patient was advised to call back or seek an in-person evaluation if the symptoms worsen or if the condition fails to improve as anticipated.  The above assessment and management plan was discussed with the  patient. The patient verbalized understanding of and has agreed to the management plan. Patient is aware to call the clinic if symptoms persist or worsen. Patient is aware when to return to the clinic for a follow-up visit. Patient educated on when it is appropriate to go to the emergency department.    I provided 12 minutes of non-face-to-face time during this encounter.    Worthy Rancher, MD

## 2020-02-28 ENCOUNTER — Telehealth: Payer: Self-pay

## 2020-02-28 NOTE — Telephone Encounter (Signed)
Patient given an appointment for tomorrow @ 4:00pm with DR. Gottschalk. Patient advised to go to ER if her shortness of Breathe worsens.

## 2020-02-29 ENCOUNTER — Ambulatory Visit (INDEPENDENT_AMBULATORY_CARE_PROVIDER_SITE_OTHER): Payer: BC Managed Care – PPO | Admitting: Family Medicine

## 2020-02-29 ENCOUNTER — Ambulatory Visit: Payer: BC Managed Care – PPO

## 2020-02-29 VITALS — BP 135/95 | HR 104 | Temp 97.2°F

## 2020-02-29 DIAGNOSIS — R0602 Shortness of breath: Secondary | ICD-10-CM

## 2020-02-29 DIAGNOSIS — M549 Dorsalgia, unspecified: Secondary | ICD-10-CM | POA: Diagnosis not present

## 2020-02-29 DIAGNOSIS — I10 Essential (primary) hypertension: Secondary | ICD-10-CM | POA: Diagnosis not present

## 2020-02-29 MED ORDER — DILTIAZEM HCL ER COATED BEADS 360 MG PO CP24: 360.0000 mg | ORAL_CAPSULE | Freq: Every day | ORAL | 0 refills | Status: DC

## 2020-02-29 NOTE — Patient Instructions (Signed)
STOP HCTZ.  There is a potential cross reactivity between sulfa allergy and diuretics.  Cardizem has been increased to 360mg .  Keep an eye on BP.  I'd like to see you in 1 month for recheck.

## 2020-02-29 NOTE — Progress Notes (Signed)
Subjective: CC: Follow-up shortness of breath PCP: [name], DO EHU:[id number] Sherri Howard is a 65 y.o. female presenting to clinic today for:  1.  Shortness of breath Patient reports that she has had intermittent shortness of breath over the last couple of weeks.  She has not had any fevers, hemoptysis.  No chest pain but she has had some upper back pain and associated queasy feeling.  She is compliant with all of her medications.  She was recently started on hydrochlorothiazide.  This does not seem to be helping her blood pressure at all.  She does have history of sulfa allergy as well as many other allergies.   ROS: Per HPI  Allergies  Allergen Reactions  . Ciprofloxacin Rash  . Clindamycin Rash    Reaction unknown  . Elemental Sulfur Rash    Duplicate   . Influenza Vaccines Rash  . Latex Rash  . Other Rash    Bandages,etc.  . Temazepam Rash  . Enablex [Darifenacin Hydrobromide Er] Swelling    Causes swelling of patient's tongue  . Hydroxyzine Hcl     Anesthetic when had tonsillectomy. Caused convulsions.  . Prednisolone Other (See Comments)    Convulsions per pt  . Sulfa Antibiotics   . Amoxicillin Diarrhea  . Oxycodone-Acetaminophen Rash    Percocet  . Pantoprazole Rash  . Pneumococcal Vaccine Swelling and Rash    Lymph node swelling   Past Medical History:  Diagnosis Date  . Anxiety   . Depression   . Hyperlipemia   . Hypertension   . Kidney stones   . Seizure ([place])   . Sleep apnea     Current Outpatient Medications:  .  acetaminophen (TYLENOL) 325 MG tablet, Take 650 mg by mouth every 6 (six) hours as needed for mild pain or moderate pain. , Disp: , Rfl:  .  ALPRAZolam (XANAX) 0.5 MG tablet, Take 0.5-1 tablets (0.25-0.5 mg total) by mouth daily as needed for anxiety. May take extra tablet during the day prn panic., Disp: 45 tablet, Rfl: 5 .  busPIRone (BUSPAR) 10 MG tablet, Take 2 tablets (20 mg total) by mouth 2 (two) times daily. FOR ANXIETY, Disp:  360 tablet, Rfl: 3 .  diltiazem (CARDIZEM CD) 240 MG 24 hr capsule, TAKE 1 CAPSULE DAILY, Disp: 90 capsule, Rfl: 1 .  fluticasone (FLONASE) 50 MCG/ACT nasal spray, Place 1 spray into both nostrils 2 (two) times daily as needed for allergies or rhinitis (nasal congestion)., Disp: 16 g, Rfl: 5 .  hydrochlorothiazide (HYDRODIURIL) 25 MG tablet, Take 1 tablet (25 mg total) by mouth daily., Disp: 30 tablet, Rfl: 2 .  hyoscyamine (LEVSIN SL) 0.125 MG SL tablet, Take 1 tablet (0.125 mg total) by mouth every 6 (six) hours as needed (schedule OV for IBS-D if needing frequently)., Disp: 30 tablet, Rfl: 0 .  losartan (COZAAR) 100 MG tablet, Take 1 tablet (100 mg total) by mouth daily., Disp: 90 tablet, Rfl: 3 .  VIIBRYD 40 MG TABS, Take 1 tablet (40 mg total) by mouth daily. (Patient taking differently: Take 40 mg by mouth daily. FOR DEPRESSION), Disp: 90 tablet, Rfl: 1 Social History   Socioeconomic History  . Marital status: Married    Spouse name: Not on file  . Number of children: 1  . Years of education: Not on file  . Highest education level: Not on file  Occupational History  . Not on file  Tobacco Use  . Smoking status: Never Smoker  . Smokeless tobacco: Never  Used  Vaping Use  . Vaping Use: Never used  Substance and Sexual Activity  . Alcohol use: No  . Drug use: No  . Sexual activity: Not on file  Other Topics Concern  . Not on file  Social History Narrative  . Not on file   Social Determinants of Health   Financial Resource Strain: Not on file  Food Insecurity: Not on file  Transportation Needs: Not on file  Physical Activity: Not on file  Stress: Not on file  Social Connections: Not on file  Intimate Partner Violence: Not on file   Family History  Problem Relation Age of Onset  . Cancer Mother   . Aneurysm Mother   . Allergic rhinitis Mother   . Cancer Father   . Hypertension Father   . Alzheimer's disease Father   . Allergic rhinitis Brother   . COPD Maternal Aunt    . Eczema Son   . Bronchitis Son   . Sinusitis Son   . Asthma Neg Hx   . Urticaria Neg Hx   . Immunodeficiency Neg Hx   . Atopy Neg Hx   . Angioedema Neg Hx     Objective: Office vital signs reviewed. BP (!) 135/95   Pulse (!) 104   Temp (!) 97.2 F (36.2 C) (Temporal)   SpO2 95%   Physical Examination:  General: Awake, alert, obese, No acute distress Cardio: regular rate and rhythm, S1S2 heard, no murmurs appreciated Pulm: clear to auscultation bilaterally, no wheezes, rhonchi or rales; normal work of breathing on room air Extremities: No edema  Assessment/ Plan: 65 y.o. female   SOB (shortness of breath) - Plan: DG Chest 2 View, EKG 12-Lead  Upper back pain - Plan: Renal Function Panel  Uncontrolled hypertension - Plan: diltiazem (CARDIZEM CD) 360 MG 24 hr capsule  I do worry that she is having an allergic reaction to the hydrochlorothiazide.  She has multiple sensitivities I do wonder if the very low risk of sulfa allergy in diuretic use is actually a palpable risk for her.  I advised her to discontinue hydrochlorothiazide.  Instead of increased her Cardizem.  She is to continue monitoring blood pressures at home.  Because of the associated upper back pain and reports of what sounded like nausea, I did obtain EKG and could not find evidence of ischemic changes.  Heart rate was normal on EKG  I advised her to contact me if symptoms are ongoing.  Low threshold to obtain chest x-ray if that is the case.  No orders of the defined types were placed in this encounter.  No orders of the defined types were placed in this encounter.    [name], DO [place] [phone]

## 2020-03-01 ENCOUNTER — Encounter: Payer: Self-pay | Admitting: Family Medicine

## 2020-03-02 ENCOUNTER — Encounter: Payer: Self-pay | Admitting: Family Medicine

## 2020-03-13 ENCOUNTER — Other Ambulatory Visit (HOSPITAL_COMMUNITY)
Admission: RE | Admit: 2020-03-13 | Discharge: 2020-03-13 | Disposition: A | Discharge status: Home / Self Care | Payer: BC Managed Care – PPO | Source: Ambulatory Visit | Attending: Nurse Practitioner | Admitting: Nurse Practitioner

## 2020-03-13 ENCOUNTER — Encounter: Payer: Self-pay | Admitting: Nurse Practitioner

## 2020-03-13 ENCOUNTER — Ambulatory Visit (INDEPENDENT_AMBULATORY_CARE_PROVIDER_SITE_OTHER): Payer: BC Managed Care – PPO | Admitting: Nurse Practitioner

## 2020-03-13 ENCOUNTER — Other Ambulatory Visit: Payer: Self-pay

## 2020-03-13 VITALS — BP 131/74 | HR 95 | Temp 98.1°F | Ht 65.0 in | Wt 276.8 lb

## 2020-03-13 DIAGNOSIS — N939 Abnormal uterine and vaginal bleeding, unspecified: Secondary | ICD-10-CM | POA: Diagnosis not present

## 2020-03-13 DIAGNOSIS — R300 Dysuria: Secondary | ICD-10-CM

## 2020-03-13 DIAGNOSIS — N898 Other specified noninflammatory disorders of vagina: Secondary | ICD-10-CM | POA: Insufficient documentation

## 2020-03-13 DIAGNOSIS — Z113 Encounter for screening for infections with a predominantly sexual mode of transmission: Secondary | ICD-10-CM | POA: Insufficient documentation

## 2020-03-13 DIAGNOSIS — M545 Low back pain, unspecified: Secondary | ICD-10-CM | POA: Diagnosis not present

## 2020-03-13 LAB — MICROSCOPIC EXAMINATION: WBC, UA: >30 /hpf — AB (ref 0–5)

## 2020-03-13 LAB — URINALYSIS, ROUTINE W REFLEX MICROSCOPIC
Bilirubin, UA: NEGATIVE
Glucose, UA: NEGATIVE
Ketones, UA: NEGATIVE
Nitrite, UA: NEGATIVE
Specific Gravity, UA: >1.03 — ABNORMAL HIGH (ref 1.005–1.030)
Urobilinogen, Ur: 0.2 mg/dL (ref 0.2–1.0)
pH, UA: 6 (ref 5.0–7.5)

## 2020-03-13 MED ORDER — CIPROFLOXACIN HCL 500 MG PO TABS: 500.0000 mg | ORAL_TABLET | Freq: Two times a day (BID) | ORAL | 0 refills | Status: DC

## 2020-03-13 NOTE — Assessment & Plan Note (Signed)
Brown vaginal discharge postmenopausal in the last 3 days.  Patient is reporting increased back pain, CVA tenderness and pelvic pressure.  Completed referral to gynecology.  Patient last Pap in 2020 was normal. Completed wet prep, urinalysis.  Urinalysis results abnormal-positive for many bacteria, leukocytes and blood. Spoke extensively with patient on starting treatment with antibiotic.  Patient has 15 allergies and unable to take most of all medication that would help resolve current symptoms.  Patient is willing to start ciprofloxacin and reports her side effect is a rash and is willing to take an antihistamine like Benadryl to help with symptoms.  Education provided to patient with printed handouts given.  Rx sent to pharmacy. Follow-up with worsening unresolved symptoms.

## 2020-03-13 NOTE — Assessment & Plan Note (Signed)
Symptoms not well controlled.  Advised patient to take Tylenol as tolerated for back pain.  Follow-up with worsening or unresolved symptoms.

## 2020-03-13 NOTE — Addendum Note (Signed)
Addended by: Baldomero Lamy B on: 03/13/2020 01:55 PM   Modules accepted: Orders

## 2020-03-13 NOTE — Patient Instructions (Signed)
Postmenopausal Bleeding Postmenopausal bleeding is any bleeding that occurs after menopause. Menopause is a time in a woman's life when monthly periods stop. Any type of bleeding after menopause should be checked by your doctor. Treatment will depend on the cause. This kind of bleeding can be caused by:  Taking hormones during menopause.  Low or high amounts of female hormones in the body. This can cause the lining of the womb (uterus) to become too thin or too thick.  Cancer.  Growths in the womb that are not cancer. Follow these instructions at home:  Watch for any changes in your symptoms. Let your doctor know about them.  Avoid using tampons and douches as told by your doctor.  Change your pads regularly.  Get regular pelvic exams. This includes Pap tests.  Take iron pills as told by your doctor.  Take over-the-counter and prescription medicines only as told by your doctor.  Keep all follow-up visits.   Contact a doctor if:  You have new bleeding from the vagina after menopause.  You have pain in your belly (abdomen). Get help right away if:  You have a fever or chills.  You have very bad pain with bleeding.  You have clumps of blood (blood clots) coming from your vagina.  You have a lot of bleeding, and: ? You use more than 1 pad an hour. ? This kind of bleeding has never happened before.  You have headaches.  You feel dizzy or you feel like you are going to pass out (faint). Summary  Any type of bleeding after menopause should be checked by your doctor.  Avoid using tampons or douches.  Get regular pelvic exams. This includes Pap tests.  Contact a doctor if you have new bleeding or pain in your belly.  Watch for any changes in your symptoms. Let your doctor know about them. This information is not intended to replace advice given to you by your health care provider. Make sure you discuss any questions you have with your health care provider. Document  Revised: 06/16/2019 Document Reviewed: 06/16/2019 Elsevier Patient Education  [place].

## 2020-03-13 NOTE — Progress Notes (Signed)
Established Patient Office Visit  Subjective:  [name], female    DOB: 09-08-1955  Age: 65 y.o. MRN: [id number]  CC:  Chief Complaint  Patient presents with  . Back Pain    LOW, 3DAYS  . Vaginal Discharge     1 WEEK    HPI Sherri Howard presents for Back Pain This is a recurrent problem. Episode onset: In the past 3 days. The problem occurs constantly. The problem has been gradually worsening since onset. The pain is present in the lumbar spine. The quality of the pain is described as aching. The pain is moderate. The pain is the same all the time. Associated symptoms include pelvic pain. (CVA tenderness, history of kidney stone) She has tried nothing for the symptoms.  Vaginal Discharge The patient's primary symptoms include pelvic pain and vaginal discharge. This is a new problem. The problem occurs constantly. The problem has been unchanged. The pain is moderate. She is not pregnant. Associated symptoms include back pain and flank pain. Pertinent negatives include no urgency. The vaginal discharge was brown. She has not been passing clots. She has not been passing tissue. Nothing aggravates the symptoms. She has tried nothing for the symptoms. It is unknown whether or not her partner has an STD. She is postmenopausal.    Past Medical History:  Diagnosis Date  . Anxiety   . Depression   . Hyperlipemia   . Hypertension   . Kidney stones   . Seizure ([place])   . Sleep apnea     Past Surgical History:  Procedure Laterality Date  . APPENDECTOMY    . CHOLECYSTECTOMY    . HERNIA REPAIR    . OVARIAN CYST SURGERY    . TONSILLECTOMY  05/1973    Family History  Problem Relation Age of Onset  . Cancer Mother   . Aneurysm Mother   . Allergic rhinitis Mother   . Cancer Father   . Hypertension Father   . Alzheimer's disease Father   . Allergic rhinitis Brother   . COPD Maternal Aunt   . Eczema Son   . Bronchitis Son   . Sinusitis Son   . Asthma Neg Hx   .  Urticaria Neg Hx   . Immunodeficiency Neg Hx   . Atopy Neg Hx   . Angioedema Neg Hx     Social History   Socioeconomic History  . Marital status: Married    Spouse name: Not on file  . Number of children: 1  . Years of education: Not on file  . Highest education level: Not on file  Occupational History  . Not on file  Tobacco Use  . Smoking status: Never Smoker  . Smokeless tobacco: Never Used  Vaping Use  . Vaping Use: Never used  Substance and Sexual Activity  . Alcohol use: No  . Drug use: No  . Sexual activity: Not on file  Other Topics Concern  . Not on file  Social History Narrative  . Not on file   Social Determinants of Health   Financial Resource Strain: Not on file  Food Insecurity: Not on file  Transportation Needs: Not on file  Physical Activity: Not on file  Stress: Not on file  Social Connections: Not on file  Intimate Partner Violence: Not on file    Outpatient Medications Prior to Visit  Medication Sig Dispense Refill  . acetaminophen (TYLENOL) 325 MG tablet Take 650 mg by mouth every 6 (six) hours as  needed for mild pain or moderate pain.     Marland Kitchen ALPRAZolam (XANAX) 0.5 MG tablet Take 0.5-1 tablets (0.25-0.5 mg total) by mouth daily as needed for anxiety. May take extra tablet during the day prn panic. 45 tablet 5  . busPIRone (BUSPAR) 10 MG tablet Take 2 tablets (20 mg total) by mouth 2 (two) times daily. FOR ANXIETY 360 tablet 3  . diltiazem (CARDIZEM CD) 360 MG 24 hr capsule Take 1 capsule (360 mg total) by mouth daily. 90 capsule 0  . hyoscyamine (LEVSIN SL) 0.125 MG SL tablet Take 1 tablet (0.125 mg total) by mouth every 6 (six) hours as needed (schedule OV for IBS-D if needing frequently). 30 tablet 0  . losartan (COZAAR) 100 MG tablet Take 1 tablet (100 mg total) by mouth daily. 90 tablet 3  . VIIBRYD 40 MG TABS Take 1 tablet (40 mg total) by mouth daily. (Patient taking differently: Take 40 mg by mouth daily. FOR DEPRESSION) 90 tablet 1   No  facility-administered medications prior to visit.    Allergies  Allergen Reactions  . Ciprofloxacin Rash  . Clindamycin Rash    Reaction unknown  . Elemental Sulfur Rash    Duplicate   . Influenza Vaccines Rash  . Latex Rash  . Other Rash    Bandages,etc.  . Temazepam Rash  . Enablex [Darifenacin Hydrobromide Er] Swelling    Causes swelling of patient's tongue  . Hydroxyzine Hcl     Anesthetic when had tonsillectomy. Caused convulsions.  . Prednisolone Other (See Comments)    Convulsions per pt  . Sulfa Antibiotics   . Amoxicillin Diarrhea  . Oxycodone-Acetaminophen Rash    Percocet  . Pantoprazole Rash  . Pneumococcal Vaccine Swelling and Rash    Lymph node swelling    ROS Review of Systems  Constitutional: Negative.   HENT: Negative.   Respiratory: Negative.   Genitourinary: Positive for flank pain, pelvic pain, vaginal bleeding and vaginal discharge. Negative for urgency and vaginal pain.  Musculoskeletal: Positive for back pain.  Skin: Negative.   Psychiatric/Behavioral: The patient is nervous/anxious.   All other systems reviewed and are negative.     Objective:    Physical Exam Vitals reviewed.  Constitutional:      Appearance: Normal appearance.  HENT:     Head: Normocephalic.     Nose: Nose normal.  Eyes:     Conjunctiva/sclera: Conjunctivae normal.  Cardiovascular:     Rate and Rhythm: Normal rate and regular rhythm.     Pulses: Normal pulses.     Heart sounds: Normal heart sounds.  Pulmonary:     Effort: Pulmonary effort is normal.     Breath sounds: Normal breath sounds.  Abdominal:     General: Bowel sounds are normal.  Genitourinary:    Vagina: Vaginal discharge present.  Musculoskeletal:        General: Tenderness present.  Skin:    General: Skin is warm.  Neurological:     Mental Status: She is alert and oriented to person, place, and time.  Psychiatric:     Comments: Positive for anxiety and depression     BP 131/74   Pulse  95   Temp 98.1 F (36.7 C)   Ht 5\' 5"  (1.651 m)   Wt 276 lb 12.8 oz (125.6 kg)   SpO2 94%   BMI 46.06 kg/m  Wt Readings from Last 3 Encounters:  03/13/20 276 lb 12.8 oz (125.6 kg)  11/24/19 277 lb (125.6 kg)  11/07/19  276 lb 9.6 oz (125.5 kg)     Health Maintenance Due  Topic Date Due  . Hepatitis C Screening  Never done  . HIV Screening  Never done  . MAMMOGRAM  Never done  . COVID-19 Vaccine (3 - Booster for Pfizer series) 10/29/2019    There are no preventive care reminders to display for this patient.  Lab Results  Component Value Date   TSH 1.190 11/07/2019   Lab Results  Component Value Date   WBC 6.6 11/07/2019   HGB 13.7 11/07/2019   HCT 41.7 11/07/2019   MCV 88 11/07/2019   PLT 275 11/07/2019   Lab Results  Component Value Date   NA 140 11/07/2019   K 4.8 11/07/2019   CO2 23 11/07/2019   GLUCOSE 91 11/07/2019   BUN 15 11/07/2019   CREATININE 1.04 (H) 11/07/2019   BILITOT 0.5 11/07/2019   ALKPHOS 68 11/07/2019   AST 26 11/07/2019   ALT 30 11/07/2019   PROT 6.8 11/07/2019   ALBUMIN 4.3 11/07/2019   CALCIUM 9.6 11/07/2019   ANIONGAP 10 08/22/2014   Lab Results  Component Value Date   CHOL 196 11/07/2019   Lab Results  Component Value Date   HDL 55 11/07/2019   Lab Results  Component Value Date   LDLCALC 120 (H) 11/07/2019   Lab Results  Component Value Date   TRIG 117 11/07/2019   Lab Results  Component Value Date   CHOLHDL 3.6 11/07/2019   Lab Results  Component Value Date   HGBA1C 5.3 11/07/2019   Flowsheet Row Office Visit from 03/13/2020 in [place]  PHQ-9 Total Score 14     GAD 7 : Generalized Anxiety Score 03/13/2020 02/29/2020 11/07/2019 06/24/2019  Nervous, Anxious, on Edge 2 2 2 3   Control/stop worrying 1 2 3 2   Worry too much - different things 1 1 2 2   Trouble relaxing 1 1 1 1   Restless 2 0 1 2  Easily annoyed or irritable 1 1 0 0  Afraid - awful might happen 0 0 1 1  Total GAD 7 Score 8 7  10 11   Anxiety Difficulty Somewhat difficult Somewhat difficult Not difficult at all Not difficult at all      Assessment & Plan:   Problem List Items Addressed This Visit      Other   Acute bilateral low back pain without sciatica    Symptoms not well controlled.  Advised patient to take Tylenol as tolerated for back pain.  Follow-up with worsening or unresolved symptoms.      Vaginal discharge - Primary    Brown vaginal discharge postmenopausal in the last 3 days.  Patient is reporting increased back pain, CVA tenderness and pelvic pressure.  Completed referral to gynecology.  Patient last Pap in 2020 was normal. Completed wet prep, urinalysis.  Urinalysis results abnormal-positive for many bacteria, leukocytes and blood. Spoke extensively with patient on starting treatment with antibiotic.  Patient has 15 allergies and unable to take most of all medication that would help resolve current symptoms.  Patient is willing to start ciprofloxacin and reports her side effect is a rash and is willing to take an antihistamine like Benadryl to help with symptoms.  Education provided to patient with printed handouts given.  Rx sent to pharmacy. Follow-up with worsening unresolved symptoms.       Other Visit Diagnoses    Dysuria       Relevant Medications   ciprofloxacin (CIPRO) 500 MG  tablet   Other Relevant Orders   Urine Culture      Meds ordered this encounter  Medications  . ciprofloxacin (CIPRO) 500 MG tablet    Sig: Take 1 tablet (500 mg total) by mouth 2 (two) times daily.    Dispense:  14 tablet    Refill:  0    Order Specific Question:   Supervising Provider    Answer:   [name] [[id number]]    Follow-up: Return if symptoms worsen or fail to improve.    Ivy Lynn, NP

## 2020-03-14 ENCOUNTER — Ambulatory Visit (INDEPENDENT_AMBULATORY_CARE_PROVIDER_SITE_OTHER): Payer: BC Managed Care – PPO | Admitting: Family Medicine

## 2020-03-14 ENCOUNTER — Encounter: Payer: Self-pay | Admitting: Family Medicine

## 2020-03-14 VITALS — BP 135/73 | HR 78 | Temp 98.2°F | Ht 65.0 in | Wt 277.0 lb

## 2020-03-14 DIAGNOSIS — R21 Rash and other nonspecific skin eruption: Secondary | ICD-10-CM

## 2020-03-14 DIAGNOSIS — N950 Postmenopausal bleeding: Secondary | ICD-10-CM | POA: Diagnosis not present

## 2020-03-14 DIAGNOSIS — B354 Tinea corporis: Secondary | ICD-10-CM

## 2020-03-14 DIAGNOSIS — R0602 Shortness of breath: Secondary | ICD-10-CM | POA: Diagnosis not present

## 2020-03-14 MED ORDER — CEPHALEXIN 500 MG PO CAPS
500.0000 mg | ORAL_CAPSULE | Freq: Two times a day (BID) | ORAL | 0 refills | Status: AC
Start: 2020-03-14 — End: 2020-03-21

## 2020-03-14 MED ORDER — KETOCONAZOLE 2 % EX CREA: 1.0000 "application " | TOPICAL_CREAM | Freq: Every day | CUTANEOUS | 0 refills | Status: AC

## 2020-03-14 NOTE — Progress Notes (Signed)
Subjective: CC: Facial swelling PCP: Sherri Norlander, DO [id number]:XNATF [name] is a 65 y.o. female presenting to clinic today for:  1.  Facial dermatitis Patient is seen by both allergy and dermatology for facial swelling/dermatitis.  She has been evaluated quite extensively and thus far has only been told that she "has a metal allergy".  She has been through several rounds of corticosteroids both topically and orally but none of these products have really made much of an impact.  In fact symptoms seem to be worsening.  Initially this only involved the periorbital spaces but now has pretty much spread across the entire face.  She would like to get a second opinion.  2.  Rash on thigh Patient reports that she developed a rash on the left thigh.  She has been applying some corticosteroid topically that she had left over from the above since yesterday.  Denies any substantial itching.  Certainly no exudate.  No known contact with ringworm.  No new pets.  3.  Shortness of breath Continues to have intermittent shortness of breath.  This was thought to be related to the HCTZ she was prescribed for blood pressure.  That has since been discontinued and she has noticed slight improvement but again symptoms are still present.  Her lowest O2 noted at home was 88%.  Her CT was fairly unremarkable in September.  No hemoptysis.  She reports depression over all of these issues that are going on in unfortunately nobody has found out etiology.   ROS: Per HPI  Allergies  Allergen Reactions  . Ciprofloxacin Rash  . Clindamycin Rash    Reaction unknown  . Elemental Sulfur Rash    Duplicate   . Influenza Vaccines Rash  . Latex Rash  . Other Rash    Bandages,etc.  . Temazepam Rash  . Enablex [Darifenacin Hydrobromide Er] Swelling    Causes swelling of patient's tongue  . Hydroxyzine Hcl     Anesthetic when had tonsillectomy. Caused convulsions.  . Prednisolone Other (See Comments)    Convulsions per  pt  . Sulfa Antibiotics   . Amoxicillin Diarrhea  . Oxycodone-Acetaminophen Rash    Percocet  . Pantoprazole Rash  . Pneumococcal Vaccine Swelling and Rash    Lymph node swelling   Past Medical History:  Diagnosis Date  . Anxiety   . Depression   . Hyperlipemia   . Hypertension   . Kidney stones   . Seizure ([place])   . Sleep apnea     Current Outpatient Medications:  .  acetaminophen (TYLENOL) 325 MG tablet, Take 650 mg by mouth every 6 (six) hours as needed for mild pain or moderate pain. , Disp: , Rfl:  .  ALPRAZolam (XANAX) 0.5 MG tablet, Take 0.5-1 tablets (0.25-0.5 mg total) by mouth daily as needed for anxiety. May take extra tablet during the day prn panic., Disp: 45 tablet, Rfl: 5 .  busPIRone (BUSPAR) 10 MG tablet, Take 2 tablets (20 mg total) by mouth 2 (two) times daily. FOR ANXIETY, Disp: 360 tablet, Rfl: 3 .  ciprofloxacin (CIPRO) 500 MG tablet, Take 1 tablet (500 mg total) by mouth 2 (two) times daily., Disp: 14 tablet, Rfl: 0 .  diltiazem (CARDIZEM CD) 360 MG 24 hr capsule, Take 1 capsule (360 mg total) by mouth daily., Disp: 90 capsule, Rfl: 0 .  hyoscyamine (LEVSIN SL) 0.125 MG SL tablet, Take 1 tablet (0.125 mg total) by mouth every 6 (six) hours as needed (schedule OV for IBS-D  if needing frequently)., Disp: 30 tablet, Rfl: 0 .  losartan (COZAAR) 100 MG tablet, Take 1 tablet (100 mg total) by mouth daily., Disp: 90 tablet, Rfl: 3 .  VIIBRYD 40 MG TABS, Take 1 tablet (40 mg total) by mouth daily. (Patient taking differently: Take 40 mg by mouth daily. FOR DEPRESSION), Disp: 90 tablet, Rfl: 1 Social History   Socioeconomic History  . Marital status: Married    Spouse name: Not on file  . Number of children: 1  . Years of education: Not on file  . Highest education level: Not on file  Occupational History  . Not on file  Tobacco Use  . Smoking status: Never Smoker  . Smokeless tobacco: Never Used  Vaping Use  . Vaping Use: Never used  Substance and Sexual  Activity  . Alcohol use: No  . Drug use: No  . Sexual activity: Not on file  Other Topics Concern  . Not on file  Social History Narrative  . Not on file   Social Determinants of Health   Financial Resource Strain: Not on file  Food Insecurity: Not on file  Transportation Needs: Not on file  Physical Activity: Not on file  Stress: Not on file  Social Connections: Not on file  Intimate Partner Violence: Not on file   Family History  Problem Relation Age of Onset  . Cancer Mother   . Aneurysm Mother   . Allergic rhinitis Mother   . Cancer Father   . Hypertension Father   . Alzheimer's disease Father   . Allergic rhinitis Brother   . COPD Maternal Aunt   . Eczema Son   . Bronchitis Son   . Sinusitis Son   . Asthma Neg Hx   . Urticaria Neg Hx   . Immunodeficiency Neg Hx   . Atopy Neg Hx   . Angioedema Neg Hx     Objective: Office vital signs reviewed. BP 135/73   Pulse 78   Temp 98.2 F (36.8 C) (Temporal)   Ht 5\' 5"  (1.651 m)   Wt 277 lb (125.6 kg)   SpO2 95%   BMI 46.10 kg/m   Physical Examination:  General: Awake, alert  HEENT: facial swelling; sclera white Cardio: regular rate and rhythm, S1S2 heard, no murmurs appreciated Pulm: clear to auscultation bilaterally, no wheezes, rhonchi or rales; normal work of breathing on room air Skin: Red, maculopapular inflamed skin noted along the forehead.  She has quite a bit of inflammation and soft tissue swelling noted near the nasal bridge and surrounding the eyes.  Left thigh with maculopapular raised border with central clearing ~2.75" in diameter.  Minimal erythema.  No skin breakdown or exudates  Assessment/ Plan: 66 y.o. female   Facial rash - Plan: Ambulatory referral to Dermatology  Postmenopausal vaginal bleeding  SOB (shortness of breath) - Plan: Ambulatory referral to Pulmonology  Tinea corporis - Plan: ketoconazole (NIZORAL) 2 % cream  I placed a referral to [facility].  I think at this point patient  deserves an evaluation by an academic dermatologist.  She certainly has something going on that than usual and is clearly refractory to multiple rounds of corticosteroids and various other topical emollients.  It sounds like allergy has not really been able to pinpoint anything that specifically has been triggering this and she is also been developing issues with shortness of breath despite relatively unremarkable CT in September.  The only findings that were noted were a bit of atelectasis on the right, micronodule  in the right middle lobe and a nonspecific pericentimeter thin-walled cyst in the right lower lobe.  I referred her to pulmonology at [facility] for formal assessment.  I wonder if she has some type of eosinophilic pulmonary process going on given the constellation of other atopic appearing issues.  The rash on her left thigh appear to be consistent with a tinea corporis.  She is been given ketoconazole for this.  With regards to her postmenopausal bleeding I certainly agree that she should be seen by GYN.  She appears to have a urinary tract infection.  I have concerned that she was prescribed Cipro given history of allergy to Cipro and have instead replaced it with Keflex, which her urine has been sensitive to in the past and she is tolerated without difficulty previously.  She was great appreciation for this.   Orders Placed This Encounter  Procedures  . Ambulatory referral to Pulmonology    Referral Priority:   Routine    Referral Type:   Consultation    Referral Reason:   Specialty Services Required    Requested Specialty:   Pulmonary Disease    Number of Visits Requested:   1  . Ambulatory referral to Dermatology    Referral Priority:   Routine    Referral Type:   Consultation    Referral Reason:   Specialty Services Required    Requested Specialty:   Dermatology    Number of Visits Requested:   1   Meds ordered this encounter  Medications  . cephALEXin (KEFLEX) 500 MG capsule     Sig: Take 1 capsule (500 mg total) by mouth 2 (two) times daily for 7 days.    Dispense:  14 capsule    Refill:  0  . ketoconazole (NIZORAL) 2 % cream    Sig: Apply 1 application topically daily for 10 days.    Dispense:  15 g    Refill:  [place], DO [place] [phone]

## 2020-03-15 LAB — CERVICOVAGINAL ANCILLARY ONLY
Bacterial Vaginitis (gardnerella): NEGATIVE
Candida Glabrata: NEGATIVE
Candida Vaginitis: NEGATIVE
Chlamydia: NEGATIVE
Comment: NEGATIVE
Comment: NEGATIVE
Comment: NEGATIVE
Comment: NEGATIVE
Comment: NEGATIVE
Comment: NORMAL
Neisseria Gonorrhea: NEGATIVE
Trichomonas: NEGATIVE

## 2020-03-16 LAB — URINE CULTURE

## 2020-03-19 ENCOUNTER — Encounter: Payer: Self-pay | Admitting: Family Medicine

## 2020-03-20 ENCOUNTER — Other Ambulatory Visit: Payer: Self-pay | Admitting: Family Medicine

## 2020-03-20 DIAGNOSIS — Z8249 Family history of ischemic heart disease and other diseases of the circulatory system: Secondary | ICD-10-CM

## 2020-03-20 DIAGNOSIS — R0602 Shortness of breath: Secondary | ICD-10-CM

## 2020-03-27 ENCOUNTER — Other Ambulatory Visit (HOSPITAL_COMMUNITY)
Admission: RE | Admit: 2020-03-27 | Discharge: 2020-03-27 | Disposition: A | Discharge status: Home / Self Care | Payer: BC Managed Care – PPO | Source: Ambulatory Visit | Attending: Obstetrics and Gynecology | Admitting: Obstetrics and Gynecology

## 2020-03-27 ENCOUNTER — Encounter: Payer: Self-pay | Admitting: Obstetrics and Gynecology

## 2020-03-27 ENCOUNTER — Ambulatory Visit (INDEPENDENT_AMBULATORY_CARE_PROVIDER_SITE_OTHER): Payer: BC Managed Care – PPO | Admitting: Obstetrics and Gynecology

## 2020-03-27 ENCOUNTER — Other Ambulatory Visit: Payer: Self-pay

## 2020-03-27 VITALS — BP 136/74 | HR 106 | Ht 65.0 in | Wt 278.0 lb

## 2020-03-27 DIAGNOSIS — Z124 Encounter for screening for malignant neoplasm of cervix: Secondary | ICD-10-CM

## 2020-03-27 DIAGNOSIS — N840 Polyp of corpus uteri: Secondary | ICD-10-CM | POA: Diagnosis not present

## 2020-03-27 DIAGNOSIS — N950 Postmenopausal bleeding: Secondary | ICD-10-CM | POA: Diagnosis not present

## 2020-03-27 DIAGNOSIS — B3731 Acute candidiasis of vulva and vagina: Secondary | ICD-10-CM

## 2020-03-27 DIAGNOSIS — N762 Acute vulvitis: Secondary | ICD-10-CM

## 2020-03-27 DIAGNOSIS — B373 Candidiasis of vulva and vagina: Secondary | ICD-10-CM | POA: Diagnosis not present

## 2020-03-27 LAB — WET PREP FOR TRICH, YEAST, CLUE

## 2020-03-27 MED ORDER — FLUCONAZOLE 150 MG PO TABS: 150.0000 mg | ORAL_TABLET | Freq: Once | ORAL | 0 refills | Status: AC

## 2020-03-27 MED ORDER — BETAMETHASONE VALERATE 0.1 % EX OINT: TOPICAL_OINTMENT | CUTANEOUS | 0 refills | Status: DC

## 2020-03-27 NOTE — Patient Instructions (Signed)
Postmenopausal Bleeding Postmenopausal bleeding is any bleeding that a woman has after she has entered menopause. Menopause is the end of a woman's fertile years. After menopause, a woman no longer ovulates and does not have menstrual periods. Therefore, she should no longer have bleeding from her vagina. Postmenopausal bleeding may have various causes, including:  Menopausal hormone therapy (MHT).  Endometrial atrophy. After menopause, low estrogen hormone levels cause the membrane that lines the uterus (endometrium) to become thin. You may have bleeding as the endometrium thins.  Endometrial hyperplasia. This condition is caused by excess estrogen hormones and low levels of progesterone hormones. The excess estrogen causes the endometrium to thicken, which can lead to bleeding. In some cases, this can lead to cancer of the uterus.  Endometrial cancer.  Noncancerous growths (polyps) on the endometrium, the lining of the uterus, or the cervix.  Uterine fibroids. These are noncancerous growths in or around the uterus muscle tissue that can cause heavy bleeding. Any type of postmenopausal bleeding, even if it appears to be a typical menstrual period, should be checked by your health care provider. Treatment will depend on the cause of the bleeding. Follow these instructions at home:  Pay attention to any changes in your symptoms. Let your health care provider know about them.  Avoid using tampons and douches as told by your health care provider.  Change your pads regularly.  Get regular pelvic exams, including Pap tests, as told by your health care provider.  Take iron supplements as told by your health care provider.  Take over-the-counter and prescription medicines only as told by your health care provider.  Keep all follow-up visits. This is important.   Contact a health care provider if:  You have new bleeding from the vagina after menopause.  You have pain in your abdomen. Get  help right away if:  You have a fever or chills.  You have severe pain with bleeding.  You are passing blood clots.  You have heavy bleeding, need more than 1 pad an hour, and have never experienced this before.  You have headaches or feel faint or dizzy. Summary  Postmenopausal bleeding is any bleeding that a woman has after she has entered into menopause.  Postmenopausal bleeding may have various causes. Treatment will depend on the cause of the bleeding.  Any type of postmenopausal bleeding, even if it appears to be a typical menstrual period, should be checked by your health care provider.  Be sure to pay attention to any changes in your symptoms and keep all follow-up visits. This information is not intended to replace advice given to you by your health care provider. Make sure you discuss any questions you have with your health care provider. Document Revised: 06/16/2019 Document Reviewed: 06/16/2019 Elsevier Patient Education  2021 [place]. Vaginal Yeast Infection, Adult  Vaginal yeast infection is a condition that causes vaginal discharge as well as soreness, swelling, and redness (inflammation) of the vagina. This is a common condition. Some women get this infection frequently. What are the causes? This condition is caused by a change in the normal balance of the yeast (candida) and bacteria that live in the vagina. This change causes an overgrowth of yeast, which causes the inflammation. What increases the risk? The condition is more likely to develop in women who:  Take antibiotic medicines.  Have diabetes.  Take birth control pills.  Are pregnant.  Douche often.  Have a weak body defense system (immune system).  Have been taking steroid  medicines for a long time.  Frequently wear tight clothing. What are the signs or symptoms? Symptoms of this condition include:  White, thick, creamy vaginal discharge.  Swelling, itching, redness, and irritation of  the vagina. The lips of the vagina (vulva) may be affected as well.  Pain or a burning feeling while urinating.  Pain during sex. How is this diagnosed? This condition is diagnosed based on:  Your medical history.  A physical exam.  A pelvic exam. Your health care provider will examine a sample of your vaginal discharge under a microscope. Your health care provider may send this sample for testing to confirm the diagnosis. How is this treated? This condition is treated with medicine. Medicines may be over-the-counter or prescription. You may be told to use one or more of the following:  Medicine that is taken by mouth (orally).  Medicine that is applied as a cream (topically).  Medicine that is inserted directly into the vagina (suppository). Follow these instructions at home: Lifestyle  Do not have sex until your health care provider approves. Tell your sex partner that you have a yeast infection. That person should go to his or her health care provider and ask if they should also be treated.  Do not wear tight clothes, such as pantyhose or tight pants.  Wear breathable cotton underwear. General instructions  Take or apply over-the-counter and prescription medicines only as told by your health care provider.  Eat more yogurt. This may help to keep your yeast infection from returning.  Do not use tampons until your health care provider approves.  Try taking a sitz bath to help with discomfort. This is a warm water bath that is taken while you are sitting down. The water should only come up to your hips and should cover your buttocks. Do this 3-4 times per day or as told by your health care provider.  Do not douche.  If you have diabetes, keep your blood sugar levels under control.  Keep all follow-up visits as told by your health care provider. This is important.   Contact a health care provider if:  You have a fever.  Your symptoms go away and then return.  Your  symptoms do not get better with treatment.  Your symptoms get worse.  You have new symptoms.  You develop blisters in or around your vagina.  You have blood coming from your vagina and it is not your menstrual period.  You develop pain in your abdomen. Summary  Vaginal yeast infection is a condition that causes discharge as well as soreness, swelling, and redness (inflammation) of the vagina.  This condition is treated with medicine. Medicines may be over-the-counter or prescription.  Take or apply over-the-counter and prescription medicines only as told by your health care provider.  Do not douche. Do not have sex or use tampons until your health care provider approves.  Contact a health care provider if your symptoms do not get better with treatment or your symptoms go away and then return. This information is not intended to replace advice given to you by your health care provider. Make sure you discuss any questions you have with your health care provider. Document Revised: 07/30/2018 Document Reviewed: 05/18/2017 Elsevier Patient Education  [place].

## 2020-03-27 NOTE — Progress Notes (Signed)
65 y.o. G9P1001 Married White or Caucasian Not Hispanic or Latino female here for a consultation from Jac Canavan, NP for post menopausal bleeding.  On 03/13/20 she had negative testing for STD's and vaginitis. At that same visit she was diagnosed with a UTI and treated with Ciprofloxacin. Since completing the antibiotic she has been having vulvar and perianal irritation/itching.   She reports 2-3 episodes of brown spotting in the last year. Each episode lasts for about 1 week. She has had intermittent pain in her lower abdomen, not currently.  Went through menopause in her early 76's. Not currently sexually active.     No LMP recorded. Patient is postmenopausal.          Sexually active: No.  The current method of family planning is post menopausal status.    Exercising: No.  The patient does not participate in regular exercise at present. Smoker:  no  Health Maintenance: Pap:  2/10/202 WNL History of abnormal Pap:  no MMG:  Unsure.  BMD:   None  Colonoscopy: 2018 TDaP:  05/24/12 Gardasil: NA   reports that she has never smoked. She has never used smokeless tobacco. She reports that she does not drink alcohol and does not use drugs.  Past Medical History:  Diagnosis Date  . Abnormal uterine bleeding   . Anemia   . Anxiety   . Depression   . Hyperlipemia   . Hypertension   . Kidney stones   . Seizure ([place])   . Sleep apnea   . Urinary incontinence     Past Surgical History:  Procedure Laterality Date  . APPENDECTOMY    . CHOLECYSTECTOMY    . HERNIA REPAIR    . OVARIAN CYST SURGERY    . TONSILLECTOMY  05/1973    Current Outpatient Medications  Medication Sig Dispense Refill  . acetaminophen (TYLENOL) 325 MG tablet Take 650 mg by mouth every 6 (six) hours as needed for mild pain or moderate pain.     Marland Kitchen ALPRAZolam (XANAX) 0.5 MG tablet Take 0.5-1 tablets (0.25-0.5 mg total) by mouth daily as needed for anxiety. May take extra tablet during the day prn panic. 45 tablet  5  . busPIRone (BUSPAR) 10 MG tablet Take 2 tablets (20 mg total) by mouth 2 (two) times daily. FOR ANXIETY 360 tablet 3  . diltiazem (CARDIZEM CD) 360 MG 24 hr capsule Take 1 capsule (360 mg total) by mouth daily. 90 capsule 0  . hyoscyamine (LEVSIN SL) 0.125 MG SL tablet Take 1 tablet (0.125 mg total) by mouth every 6 (six) hours as needed (schedule OV for IBS-D if needing frequently). 30 tablet 0  . losartan (COZAAR) 100 MG tablet Take 1 tablet (100 mg total) by mouth daily. 90 tablet 3  . VIIBRYD 40 MG TABS Take 1 tablet (40 mg total) by mouth daily. (Patient taking differently: Take 40 mg by mouth daily. FOR DEPRESSION) 90 tablet 1   No current facility-administered medications for this visit.    Family History  Problem Relation Age of Onset  . Cancer Mother   . Aneurysm Mother   . Allergic rhinitis Mother   . Breast cancer Mother 55  . Cancer Father   . Hypertension Father   . Alzheimer's disease Father   . Allergic rhinitis Brother   . COPD Maternal Aunt   . Eczema Son   . Bronchitis Son   . Sinusitis Son   . Asthma Neg Hx   . Urticaria Neg Hx   .  Immunodeficiency Neg Hx   . Atopy Neg Hx   . Angioedema Neg Hx   Mom with breast cancer.   Review of Systems: facial swelling (for a year), IBS  Exam:   BP 136/74   Pulse (!) 106   Ht 5\' 5"  (1.651 m)   Wt 278 lb (126.1 kg)   SpO2 97%   BMI 46.26 kg/m   Weight change: @WEIGHTCHANGE @ Height:   Height: 5\' 5"  (165.1 cm)  Ht Readings from Last 3 Encounters:  03/27/20 5\' 5"  (1.651 m)  03/14/20 5\' 5"  (1.651 m)  03/13/20 5\' 5"  (1.651 m)    General appearance: alert, cooperative and appears stated age Head: Normocephalic, without obvious abnormality, atraumatic Neck: no adenopathy, supple, symmetrical, trachea midline and thyroid normal to inspection and palpation Abdomen: soft, non-tender; non distended,  no masses,  no organomegaly Extremities: extremities normal, atraumatic, no cyanosis or edema Skin: Skin color,  texture, turgor normal. No rashes or lesions Lymph nodes: Cervical, supraclavicular nodes normal. No abnormal inguinal nodes palpated Neurologic: Grossly normal   Pelvic: External genitalia:  no lesions, erythematous on the vulva and perianal region              Urethra:  normal appearing urethra with no masses, tenderness or lesions              Bartholins and Skenes: normal                 Vagina: normal appearing vagina with normal color and discharge, no lesions              Cervix: no lesions               Bimanual Exam:  Uterus:  no masses or tenderness              Adnexa: no mass, fullness, tenderness               Rectovaginal: Confirms               Anus:  normal sphincter tone, no lesions  The risks of endometrial biopsy were reviewed and a consent was obtained.  A speculum was placed in the vagina and the cervix was cleansed with betadine. A tenaculum was placed on the cervix and the pipelle was placed into the endometrial cavity. The uterus sounded to 8 cm. The endometrial biopsy was performed, taking care to get a representative sample, sampling 360 degrees of the uterine cavity. Moderate tissue was obtained. The tenaculum and speculum were removed. There were no complications.    Gae Dry chaperoned for the exam.  1. Postmenopausal bleeding - US PELVIS TRANSVAGINAL NON-OB (TV ONLY); Future - Cytology - PAP - Surgical pathology( [place]/ [facility])  2. Acute vulvitis Started after finishing antibiotics - WET PREP FOR TRICH, YEAST, CLUE: +yeast - betamethasone valerate ointment (VALISONE) 0.1 %; Use a pea sized amount topically BID for 1-2 weeks as needed  Dispense: 30 g; Refill: 0  3. Screening for cervical cancer - Cytology - PAP  4. Yeast vaginitis - fluconazole (DIFLUCAN) 150 MG tablet; Take 1 tablet (150 mg total) by mouth once for 1 dose. Take one tablet.  Repeat in 72 hours if symptoms are not completely resolved.  Dispense: 2 tablet; Refill: 0  CC:  Jac Canavan, NP Note sent

## 2020-03-28 ENCOUNTER — Ambulatory Visit (INDEPENDENT_AMBULATORY_CARE_PROVIDER_SITE_OTHER): Payer: BC Managed Care – PPO | Admitting: Cardiology

## 2020-03-28 ENCOUNTER — Encounter: Payer: Self-pay | Admitting: Cardiology

## 2020-03-28 VITALS — BP 155/87 | HR 81 | Ht 65.0 in | Wt 276.1 lb

## 2020-03-28 DIAGNOSIS — R0609 Other forms of dyspnea: Secondary | ICD-10-CM

## 2020-03-28 DIAGNOSIS — R0600 Dyspnea, unspecified: Secondary | ICD-10-CM | POA: Diagnosis not present

## 2020-03-28 DIAGNOSIS — I10 Essential (primary) hypertension: Secondary | ICD-10-CM

## 2020-03-28 LAB — CYTOLOGY - PAP
Comment: NEGATIVE
Diagnosis: NEGATIVE
High risk HPV: NEGATIVE

## 2020-03-28 MED ORDER — AMLODIPINE BESYLATE 10 MG PO TABS: 10.0000 mg | ORAL_TABLET | Freq: Every day | ORAL | 3 refills | Status: DC

## 2020-03-28 MED ORDER — CARVEDILOL 6.25 MG PO TABS: 6.2500 mg | ORAL_TABLET | Freq: Two times a day (BID) | ORAL | 3 refills | Status: DC

## 2020-03-28 NOTE — Progress Notes (Signed)
Referring-Ashley Lajuana Ripple, DO Reason for referral-dyspnea  HPI: 65 year old female for evaluation of dyspnea at request of Adam Phenix, DO.  Echocardiogram September 2017 showed normal LV function, mild left ventricular hypertrophy, grade 1 diastolic dysfunction and mild aortic insufficiency.  Nuclear study 2018 showed ejection fraction 74% and normal perfusion.  Patient states he has had dyspnea on exertion for at least 1 year.  It occurs with minimal activity.  There is no orthopnea, PND, chest pain or syncope.  Occasional minimal pedal edema.  Cardiology now asked to evaluate.  Note she also describes occasional edema in her face and head pounding.  Current Outpatient Medications  Medication Sig Dispense Refill  . acetaminophen (TYLENOL) 325 MG tablet Take 650 mg by mouth every 6 (six) hours as needed for mild pain or moderate pain.     Marland Kitchen ALPRAZolam (XANAX) 0.5 MG tablet Take 0.5-1 tablets (0.25-0.5 mg total) by mouth daily as needed for anxiety. May take extra tablet during the day prn panic. 45 tablet 5  . betamethasone valerate ointment (VALISONE) 0.1 % Use a pea sized amount topically BID for 1-2 weeks as needed 30 g 0  . busPIRone (BUSPAR) 10 MG tablet Take 2 tablets (20 mg total) by mouth 2 (two) times daily. FOR ANXIETY 360 tablet 3  . hyoscyamine (LEVSIN SL) 0.125 MG SL tablet Take 1 tablet (0.125 mg total) by mouth every 6 (six) hours as needed (schedule OV for IBS-D if needing frequently). 30 tablet 0  . losartan (COZAAR) 100 MG tablet Take 1 tablet (100 mg total) by mouth daily. 90 tablet 3  . VIIBRYD 40 MG TABS Take 1 tablet (40 mg total) by mouth daily. (Patient taking differently: Take 40 mg by mouth daily. FOR DEPRESSION) 90 tablet 1   No current facility-administered medications for this visit.    Allergies  Allergen Reactions  . Ciprofloxacin Rash  . Clindamycin Rash    Reaction unknown  . Elemental Sulfur Rash    Duplicate   . Influenza Vaccines Rash  .  Latex Rash  . Other Rash    Bandages,etc.  . Temazepam Rash  . Enablex [Darifenacin Hydrobromide Er] Swelling    Causes swelling of patient's tongue  . Hydroxyzine Hcl     Anesthetic when had tonsillectomy. Caused convulsions.  . Prednisolone Other (See Comments)    Convulsions per pt  . Sulfa Antibiotics   . Amoxicillin Diarrhea  . Oxycodone-Acetaminophen Rash    Percocet  . Pantoprazole Rash  . Pneumococcal Vaccine Swelling and Rash    Lymph node swelling     Past Medical History:  Diagnosis Date  . Abnormal uterine bleeding   . Anemia   . Anxiety   . Depression   . Hyperlipemia   . Hypertension   . IBS (irritable bowel syndrome)   . Kidney stones   . Seizure ([place])   . Sleep apnea   . Urinary incontinence     Past Surgical History:  Procedure Laterality Date  . APPENDECTOMY     with partial colectomy  . CHOLECYSTECTOMY    . HERNIA REPAIR    . OVARIAN CYST SURGERY    . PARTIAL COLECTOMY    . TONSILLECTOMY  05/1973    Social History   Socioeconomic History  . Marital status: Married    Spouse name: Not on file  . Number of children: 1  . Years of education: Not on file  . Highest education level: Not on file  Occupational History  .  Not on file  Tobacco Use  . Smoking status: Never Smoker  . Smokeless tobacco: Never Used  Vaping Use  . Vaping Use: Never used  Substance and Sexual Activity  . Alcohol use: Yes    Comment: Rare  . Drug use: No  . Sexual activity: Not Currently    Birth control/protection: Post-menopausal  Other Topics Concern  . Not on file  Social History Narrative  . Not on file   Social Determinants of Health   Financial Resource Strain: Not on file  Food Insecurity: Not on file  Transportation Needs: Not on file  Physical Activity: Not on file  Stress: Not on file  Social Connections: Not on file  Intimate Partner Violence: Not on file    Family History  Problem Relation Age of Onset  . Cancer Mother   . Aneurysm  Mother   . Allergic rhinitis Mother   . Breast cancer Mother 55  . Atrial fibrillation Mother   . Cancer Father   . Hypertension Father   . Alzheimer's disease Father   . CAD Father   . Allergic rhinitis Brother   . COPD Maternal Aunt   . Eczema Son   . Bronchitis Son   . Sinusitis Son   . Asthma Neg Hx   . Urticaria Neg Hx   . Immunodeficiency Neg Hx   . Atopy Neg Hx   . Angioedema Neg Hx     ROS: no fevers or chills, productive cough, hemoptysis, dysphasia, odynophagia, melena, hematochezia, dysuria, hematuria, rash, seizure activity, orthopnea, PND, claudication. Remaining systems are negative.  Physical Exam:   Blood pressure (!) 155/87, pulse 81, height 5\' 5"  (1.651 m), weight 276 lb 1.9 oz (125.2 kg).  General:  Well developed/obese in NAD Skin warm/dry Patient not depressed No peripheral clubbing Back-normal HEENT-normal/normal eyelids Neck supple/normal carotid upstroke bilaterally; no bruits; no JVD; no thyromegaly chest - CTA/ normal expansion CV - RRR/normal S1 and S2; no murmurs, rubs or gallops;  PMI nondisplaced Abdomen -NT/ND, no HSM, no mass, + bowel sounds, no bruit 2+ femoral pulses, no bruits Ext-no edema, chords, 2+ DP Neuro-grossly nonfocal  ECG - 02/29/20-sinus rhythm; no ST changes; personally reviewed  A/P  1 dyspnea on exertion-etiology unclear.  We will arrange an echocardiogram to reassess LV function.  I will also arrange a calcium score particularly in light of family history.  She is not having chest pain.  She does have a history of obstructive sleep apnea and is not using her CPAP on a regular basis due to facial edema.  I have asked her to follow-up with pulmonary for further options.  There may also be a component of obesity and deconditioning.  2 hypertension-patient's blood pressure is elevated.  She also has a first-degree AV block on her ECG.  I will discontinue Cardizem and instead treat with amlodipine 10 mg daily.  I will add  carvedilol 6.25 mg twice daily.  Continue losartan.  Titrate medications as tolerated.  Note she had difficulties with diuretics by her report.  3 obesity-we discussed the importance of weight loss.  Kirk Ruths, MD

## 2020-03-28 NOTE — Patient Instructions (Signed)
Medication Instructions:   STOP DILTIAZEM  START AMLODIPINE 10 MG ONCE DAILY  START CARVEDILOL 6.25 MG TWICE DAILY  *If you need a refill on your cardiac medications before your next appointment, please call your pharmacy*  Testing/Procedures:  Your physician has requested that you have an echocardiogram. Echocardiography is a painless test that uses sound waves to create images of your heart. It provides your doctor with information about the size and shape of your heart and how well your heart's chambers and valves are working. This procedure takes approximately one hour. There are no restrictions for this procedure.[place]: At [facility], you and your health needs are our priority.  As part of our continuing mission to provide you with exceptional heart care, we have created designated Provider Care Teams.  These Care Teams include your primary Cardiologist (physician) and Advanced Practice Providers (APPs -  Physician Assistants and Nurse Practitioners) who all work together to provide you with the care you need, when you need it.  We recommend signing up for the patient portal called "MyChart".  Sign up information is provided on this After Visit Summary.  MyChart is used to connect with patients for Virtual Visits (Telemedicine).  Patients are able to view lab/test results, encounter notes, upcoming appointments, etc.  Non-urgent messages can be sent to your provider as well.   To learn more about what you can do with MyChart, go to [email or web address].    Your next appointment:   3 month(s)  The format for your next appointment:   In Person  Provider:   Kirk Ruths, MD

## 2020-03-28 NOTE — Addendum Note (Signed)
Addended by: Cristopher Estimable on: 03/28/2020 04:01 PM   Modules accepted: Orders

## 2020-03-29 ENCOUNTER — Encounter: Payer: Self-pay | Admitting: Family Medicine

## 2020-03-29 LAB — SURGICAL PATHOLOGY

## 2020-03-30 ENCOUNTER — Encounter: Payer: Self-pay | Admitting: Obstetrics and Gynecology

## 2020-03-30 ENCOUNTER — Telehealth: Payer: Self-pay | Admitting: *Deleted

## 2020-03-30 NOTE — Telephone Encounter (Signed)
Spoke with patient.  Patient request to proceed with hysteroscopy D&C. Reviewed available surgery dates, patient request to proceed with scheduling for 04/09/20.  Advised patient I will return call to confirm once scheduled.   She is scheduled for PUS on 04/10/20, advised will keep as scheduled and confirm with Dr. Talbert Nan before cancelling. Patient agreeable.   Pre-op scheduled for 04/03/20 at 11am.   Patient reports light vaginal bleeding this morning with menses like cramps, 1 out of 10. Denies any other symptoms. Advised patient to continue to monitor bleeding, return call to office if bleeding becomes heavy, changing saturated pad q1-2 hours or any new symptoms develop. Advised I will update Dr. Talbert Nan and return call if any additional recommendations. Advised can also take OTC motrin PRN for mild cramps. Patient agreeable.   Dr. Talbert Nan -ok to cancel PUS?   Cc: Hayley Carder

## 2020-04-02 NOTE — Telephone Encounter (Signed)
Please cancel the ultrasound. Mychart message sent.

## 2020-04-02 NOTE — Telephone Encounter (Signed)
Patient having D&C Hysteroscopy 04/09/20. Ok to cancel pelvic u/s scheduled 04/10/20?

## 2020-04-03 ENCOUNTER — Other Ambulatory Visit: Payer: Self-pay

## 2020-04-03 ENCOUNTER — Encounter: Payer: Self-pay | Admitting: Obstetrics and Gynecology

## 2020-04-03 ENCOUNTER — Ambulatory Visit (INDEPENDENT_AMBULATORY_CARE_PROVIDER_SITE_OTHER): Payer: BC Managed Care – PPO | Admitting: Obstetrics and Gynecology

## 2020-04-03 VITALS — BP 130/76 | HR 98 | Ht 65.0 in | Wt 274.0 lb

## 2020-04-03 DIAGNOSIS — N840 Polyp of corpus uteri: Secondary | ICD-10-CM

## 2020-04-03 DIAGNOSIS — N950 Postmenopausal bleeding: Secondary | ICD-10-CM

## 2020-04-03 NOTE — Telephone Encounter (Signed)
Spoke with patient. Surgery date request confirmed.  Advised surgery is scheduled for 04/09/20, [place], [place].  Surgery instruction sheet and hospital brochure reviewed and provided to patient.  Patient advised of Covid screening and quarantine requirements and agreeable.  PUS cancelled for 04/10/20.   Routing to provider. Encounter closed.   Cc: Hayley Carder

## 2020-04-03 NOTE — H&P (View-Only) (Signed)
GYNECOLOGY  VISIT   HPI: 65 y.o.   Married White or Caucasian Not Hispanic or Latino  female   G1P1001 with No LMP recorded. Patient is postmenopausal.   here for pre op visit for [facility] hysteroscopy scheduled for 04/09/20.   The patient presented earlier this month with PMP spotting. A pap returned as normal and an endometrial biopsy returned with endometrial polyp.   GYNECOLOGIC HISTORY: No LMP recorded. Patient is postmenopausal. Contraception:pmp Menopausal hormone therapy: none         OB History    Gravida  1   Para  1   Term  1   Preterm      AB      Living  1     SAB      IAB      Ectopic      Multiple      Live Births  1              Patient Active Problem List   Diagnosis Date Noted  . Vaginal discharge 03/13/2020  . Vaginal bleeding 03/13/2020  . Pruritic rash 11/24/2019  . Pruritus 11/24/2019  . Chronic rhinitis 11/24/2019  . Multiple drug allergies 11/24/2019  . Mild obesity 08/13/2016  . Encounter for screening colonoscopy 07/17/2016  . Depression, recurrent ([place]) 02/28/2016  . Pyelonephritis 11/09/2015  . Right ureteral calculus 11/07/2015  . Encounter for opiate analgesic use agreement 09/20/2015  . Pain medication agreement signed 09/20/2015  . Acute bilateral low back pain without sciatica 08/24/2015  . Fatigue 08/09/2015  . Palpitations 06/26/2015  . Hydronephrosis 06/18/2015  . Abdominal pain 03/02/2015  . Dyspnea 03/02/2015  . Benign essential hypertension 12/20/2014  . Chronic recurrent major depressive disorder ([place]) 12/20/2014  . Dyslipidemia 12/20/2014  . Generalized anxiety disorder 12/20/2014  . Irritable bowel syndrome 12/20/2014  . Morbid obesity with BMI of 40.0-44.9, adult ([place]) 12/20/2014  . OSA on CPAP 12/20/2014  . Lactose intolerance 12/20/2014  . Insomnia 12/20/2014  . Vitamin D deficiency 12/20/2014  . Urinary incontinence 12/20/2014  . Other specified postprocedural states 07/31/2014  . S/P herniorrhaphy  07/31/2014  . Ventral incisional hernia 06/16/2014  . History of colectomy 09/07/2012  . Adenomatous colon polyp 07/21/2012  . History of renal calculi 07/21/2012    Past Medical History:  Diagnosis Date  . Abnormal uterine bleeding   . Anemia   . Anxiety   . Depression   . Hyperlipemia   . Hypertension   . IBS (irritable bowel syndrome)   . Kidney stones   . Seizure ([place])   . Sleep apnea   . Urinary incontinence     Past Surgical History:  Procedure Laterality Date  . APPENDECTOMY     with partial colectomy  . CHOLECYSTECTOMY    . HERNIA REPAIR    . OVARIAN CYST SURGERY    . PARTIAL COLECTOMY    . TONSILLECTOMY  05/1973  Breast lumpectomy as a teenager, benign.   Current Outpatient Medications  Medication Sig Dispense Refill  . acetaminophen (TYLENOL) 325 MG tablet Take 650 mg by mouth every 6 (six) hours as needed for mild pain or moderate pain.     Marland Kitchen ALPRAZolam (XANAX) 0.5 MG tablet Take 0.5-1 tablets (0.25-0.5 mg total) by mouth daily as needed for anxiety. May take extra tablet during the day prn panic. 45 tablet 5  . amLODipine (NORVASC) 10 MG tablet Take 1 tablet (10 mg total) by mouth daily. 180 tablet 3  . betamethasone valerate  ointment (VALISONE) 0.1 % Use a pea sized amount topically BID for 1-2 weeks as needed 30 g 0  . busPIRone (BUSPAR) 10 MG tablet Take 2 tablets (20 mg total) by mouth 2 (two) times daily. FOR ANXIETY 360 tablet 3  . carvedilol (COREG) 6.25 MG tablet Take 1 tablet (6.25 mg total) by mouth 2 (two) times daily. 180 tablet 3  . hyoscyamine (LEVSIN SL) 0.125 MG SL tablet Take 1 tablet (0.125 mg total) by mouth every 6 (six) hours as needed (schedule OV for IBS-D if needing frequently). 30 tablet 0  . losartan (COZAAR) 100 MG tablet Take 1 tablet (100 mg total) by mouth daily. 90 tablet 3  . VIIBRYD 40 MG TABS Take 1 tablet (40 mg total) by mouth daily. (Patient taking differently: Take 40 mg by mouth daily. FOR DEPRESSION) 90 tablet 1   No  current facility-administered medications for this visit.     ALLERGIES: Ciprofloxacin, Clindamycin, Elemental sulfur, Influenza vaccines, Latex, Other, Temazepam, Enablex [darifenacin hydrobromide er], Hydroxyzine hcl, Prednisolone, Sulfa antibiotics, Amoxicillin, Oxycodone-acetaminophen, Pantoprazole, and Pneumococcal vaccine  Family History  Problem Relation Age of Onset  . Cancer Mother   . Aneurysm Mother   . Allergic rhinitis Mother   . Breast cancer Mother 54  . Atrial fibrillation Mother   . Cancer Father   . Hypertension Father   . Alzheimer's disease Father   . CAD Father   . Allergic rhinitis Brother   . COPD Maternal Aunt   . Eczema Son   . Bronchitis Son   . Sinusitis Son   . Asthma Neg Hx   . Urticaria Neg Hx   . Immunodeficiency Neg Hx   . Atopy Neg Hx   . Angioedema Neg Hx     Social History   Socioeconomic History  . Marital status: Married    Spouse name: Not on file  . Number of children: 1  . Years of education: Not on file  . Highest education level: Not on file  Occupational History  . Not on file  Tobacco Use  . Smoking status: Never Smoker  . Smokeless tobacco: Never Used  Vaping Use  . Vaping Use: Never used  Substance and Sexual Activity  . Alcohol use: Yes    Comment: Rare  . Drug use: No  . Sexual activity: Not Currently    Birth control/protection: Post-menopausal  Other Topics Concern  . Not on file  Social History Narrative  . Not on file   Social Determinants of Health   Financial Resource Strain: Not on file  Food Insecurity: Not on file  Transportation Needs: Not on file  Physical Activity: Not on file  Stress: Not on file  Social Connections: Not on file  Intimate Partner Violence: Not on file    Review of Systems  All other systems reviewed and are negative.   PHYSICAL EXAMINATION:    BP 130/76   Pulse 98   Ht 5\' 5"  (1.651 m)   Wt 274 lb (124.3 kg)   SpO2 100%   BMI 45.60 kg/m     General appearance:  alert, cooperative and appears stated age Neck: no adenopathy, supple, symmetrical, trachea midline and thyroid normal to inspection and palpation Heart: regular rate and rhythm Lungs: CTAB Abdomen: soft, non-tender; bowel sounds normal; no masses,  no organomegaly Extremities: normal, atraumatic, no cyanosis Skin: normal color, texture and turgor, no rashes or lesions Lymph: normal cervical supraclavicular and inguinal nodes Neurologic: grossly normal  1. Postmenopausal bleeding Normal pap Endometrial biopsy with polyp Plan: hysteroscopy, polypectomy, dilation and curettage. Reviewed risks, including: bleeding, infection, uterine perforation, fluid overload, need for further sugery Jehovah's witness, declines blood products  2. Endometrial polyp As above

## 2020-04-03 NOTE — Progress Notes (Signed)
GYNECOLOGY  VISIT   HPI: 65 y.o.   Married White or Caucasian Not Hispanic or Latino  female   G1P1001 with No LMP recorded. Patient is postmenopausal.   here for pre op visit for [facility] hysteroscopy scheduled for 04/09/20.   The patient presented earlier this month with PMP spotting. A pap returned as normal and an endometrial biopsy returned with endometrial polyp.   GYNECOLOGIC HISTORY: No LMP recorded. Patient is postmenopausal. Contraception:pmp Menopausal hormone therapy: none         OB History    Gravida  1   Para  1   Term  1   Preterm      AB      Living  1     SAB      IAB      Ectopic      Multiple      Live Births  1              Patient Active Problem List   Diagnosis Date Noted  . Vaginal discharge 03/13/2020  . Vaginal bleeding 03/13/2020  . Pruritic rash 11/24/2019  . Pruritus 11/24/2019  . Chronic rhinitis 11/24/2019  . Multiple drug allergies 11/24/2019  . Mild obesity 08/13/2016  . Encounter for screening colonoscopy 07/17/2016  . Depression, recurrent ([facility]) 02/28/2016  . Pyelonephritis 11/09/2015  . Right ureteral calculus 11/07/2015  . Encounter for opiate analgesic use agreement 09/20/2015  . Pain medication agreement signed 09/20/2015  . Acute bilateral low back pain without sciatica 08/24/2015  . Fatigue 08/09/2015  . Palpitations 06/26/2015  . Hydronephrosis 06/18/2015  . Abdominal pain 03/02/2015  . Dyspnea 03/02/2015  . Benign essential hypertension 12/20/2014  . Chronic recurrent major depressive disorder ([facility]) 12/20/2014  . Dyslipidemia 12/20/2014  . Generalized anxiety disorder 12/20/2014  . Irritable bowel syndrome 12/20/2014  . Morbid obesity with BMI of 40.0-44.9, adult ([facility]) 12/20/2014  . OSA on CPAP 12/20/2014  . Lactose intolerance 12/20/2014  . Insomnia 12/20/2014  . Vitamin D deficiency 12/20/2014  . Urinary incontinence 12/20/2014  . Other specified postprocedural states 07/31/2014  . S/P herniorrhaphy  07/31/2014  . Ventral incisional hernia 06/16/2014  . History of colectomy 09/07/2012  . Adenomatous colon polyp 07/21/2012  . History of renal calculi 07/21/2012    Past Medical History:  Diagnosis Date  . Abnormal uterine bleeding   . Anemia   . Anxiety   . Depression   . Hyperlipemia   . Hypertension   . IBS (irritable bowel syndrome)   . Kidney stones   . Seizure ([facility])   . Sleep apnea   . Urinary incontinence     Past Surgical History:  Procedure Laterality Date  . APPENDECTOMY     with partial colectomy  . CHOLECYSTECTOMY    . HERNIA REPAIR    . OVARIAN CYST SURGERY    . PARTIAL COLECTOMY    . TONSILLECTOMY  05/1973  Breast lumpectomy as a teenager, benign.   Current Outpatient Medications  Medication Sig Dispense Refill  . acetaminophen (TYLENOL) 325 MG tablet Take 650 mg by mouth every 6 (six) hours as needed for mild pain or moderate pain.     . ALPRAZolam (XANAX) 0.5 MG tablet Take 0.5-1 tablets (0.25-0.5 mg total) by mouth daily as needed for anxiety. May take extra tablet during the day prn panic. 45 tablet 5  . amLODipine (NORVASC) 10 MG tablet Take 1 tablet (10 mg total) by mouth daily. 180 tablet 3  . betamethasone valerate   ointment (VALISONE) 0.1 % Use a pea sized amount topically BID for 1-2 weeks as needed 30 g 0  . busPIRone (BUSPAR) 10 MG tablet Take 2 tablets (20 mg total) by mouth 2 (two) times daily. FOR ANXIETY 360 tablet 3  . carvedilol (COREG) 6.25 MG tablet Take 1 tablet (6.25 mg total) by mouth 2 (two) times daily. 180 tablet 3  . hyoscyamine (LEVSIN SL) 0.125 MG SL tablet Take 1 tablet (0.125 mg total) by mouth every 6 (six) hours as needed (schedule OV for IBS-D if needing frequently). 30 tablet 0  . losartan (COZAAR) 100 MG tablet Take 1 tablet (100 mg total) by mouth daily. 90 tablet 3  . VIIBRYD 40 MG TABS Take 1 tablet (40 mg total) by mouth daily. (Patient taking differently: Take 40 mg by mouth daily. FOR DEPRESSION) 90 tablet 1   No  current facility-administered medications for this visit.     ALLERGIES: Ciprofloxacin, Clindamycin, Elemental sulfur, Influenza vaccines, Latex, Other, Temazepam, Enablex [darifenacin hydrobromide er], Hydroxyzine hcl, Prednisolone, Sulfa antibiotics, Amoxicillin, Oxycodone-acetaminophen, Pantoprazole, and Pneumococcal vaccine  Family History  Problem Relation Age of Onset  . Cancer Mother   . Aneurysm Mother   . Allergic rhinitis Mother   . Breast cancer Mother 60  . Atrial fibrillation Mother   . Cancer Father   . Hypertension Father   . Alzheimer's disease Father   . CAD Father   . Allergic rhinitis Brother   . COPD Maternal Aunt   . Eczema Son   . Bronchitis Son   . Sinusitis Son   . Asthma Neg Hx   . Urticaria Neg Hx   . Immunodeficiency Neg Hx   . Atopy Neg Hx   . Angioedema Neg Hx     Social History   Socioeconomic History  . Marital status: Married    Spouse name: Not on file  . Number of children: 1  . Years of education: Not on file  . Highest education level: Not on file  Occupational History  . Not on file  Tobacco Use  . Smoking status: Never Smoker  . Smokeless tobacco: Never Used  Vaping Use  . Vaping Use: Never used  Substance and Sexual Activity  . Alcohol use: Yes    Comment: Rare  . Drug use: No  . Sexual activity: Not Currently    Birth control/protection: Post-menopausal  Other Topics Concern  . Not on file  Social History Narrative  . Not on file   Social Determinants of Health   Financial Resource Strain: Not on file  Food Insecurity: Not on file  Transportation Needs: Not on file  Physical Activity: Not on file  Stress: Not on file  Social Connections: Not on file  Intimate Partner Violence: Not on file    Review of Systems  All other systems reviewed and are negative.   PHYSICAL EXAMINATION:    BP 130/76   Pulse 98   Ht 5' 5" (1.651 m)   Wt 274 lb (124.3 kg)   SpO2 100%   BMI 45.60 kg/m     General appearance:  alert, cooperative and appears stated age Neck: no adenopathy, supple, symmetrical, trachea midline and thyroid normal to inspection and palpation Heart: regular rate and rhythm Lungs: CTAB Abdomen: soft, non-tender; bowel sounds normal; no masses,  no organomegaly Extremities: normal, atraumatic, no cyanosis Skin: normal color, texture and turgor, no rashes or lesions Lymph: normal cervical supraclavicular and inguinal nodes Neurologic: grossly normal        1. Postmenopausal bleeding Normal pap Endometrial biopsy with polyp Plan: hysteroscopy, polypectomy, dilation and curettage. Reviewed risks, including: bleeding, infection, uterine perforation, fluid overload, need for further sugery Jehovah's witness, declines blood products  2. Endometrial polyp As above   

## 2020-04-04 ENCOUNTER — Ambulatory Visit: Payer: BC Managed Care – PPO | Admitting: Family Medicine

## 2020-04-05 ENCOUNTER — Other Ambulatory Visit: Payer: Self-pay

## 2020-04-05 ENCOUNTER — Encounter (HOSPITAL_BASED_OUTPATIENT_CLINIC_OR_DEPARTMENT_OTHER): Payer: Self-pay | Admitting: Obstetrics and Gynecology

## 2020-04-05 NOTE — Progress Notes (Addendum)
ADDENDUM:  Reviewed chart w/ anesthesia, Dr Doroteo Glassman MDA, ok to proceed barring any status change dos.   Spoke w/ via phone for pre-op interview--- PT Lab needs dos---- Sherri Howard              Lab results------ current ekg in epic/ chart COVID test ------ 04-06-2020 @ 1455  Arrive at ------- 0930 on 04-09-2020 NPO after MN NO Solid Food.  Clear liquids from MN until--- 0830 Med rec completed Medications to take morning of surgery ----- Buspar, Viibryd, Coreg, Norvasc Diabetic medication ----- n/a  Patient instructed to bring photo id and insurance card day of surgery Patient aware to have Driver (ride ) / caregiver    for 24 hours after surgery --- husband, Sherri Howard  Patient Special Instructions ----- n/a Pre-Op special Istructions ----- n/a Patient verbalized understanding of instructions that were given at this phone interview. Patient denies shortness of breath, chest pain, fever, cough at this phone interview.   Anesthesia Review:  HTN;  Mild OSA (pt stated had not been using but started back using nightly last week);  Pt denies any cardiac s&s and no peripheral swelling.  Pt does state intermittent sob when sitting, lying, getting up/ down from sitting position and exertion.  Pt BMI 45.60.  Is scheduled to have echo and cardiac CT 04-24-2020 by cardiology.  PCP:  Dr Lajuana Ripple Cassell Clement 03-14-2020) Cardiologist : Dr Stanford Breed for eval for doe Cassell Clement 03-28-2020) Chest x-ray :  06-05-2016 epic EKG : 02-29-2020 epic Echo : 10-10-2015 epic Stress test: 01-24-2016 epic Cardiac Cath :  no Activity level:  See above Sleep Study/ CPAP : YES/ YES Blood Thinner/ Instructions Maryjane Hurter Dose:  NO ASA / Instructions/ Last Dose :  NO

## 2020-04-06 ENCOUNTER — Encounter: Payer: Self-pay | Admitting: Obstetrics and Gynecology

## 2020-04-06 ENCOUNTER — Other Ambulatory Visit (HOSPITAL_COMMUNITY)
Admission: RE | Admit: 2020-04-06 | Discharge: 2020-04-06 | Disposition: A | Discharge status: Home / Self Care | Payer: BC Managed Care – PPO | Source: Ambulatory Visit | Attending: Obstetrics and Gynecology | Admitting: Obstetrics and Gynecology

## 2020-04-06 DIAGNOSIS — Z885 Allergy status to narcotic agent status: Secondary | ICD-10-CM | POA: Diagnosis not present

## 2020-04-06 DIAGNOSIS — Z8249 Family history of ischemic heart disease and other diseases of the circulatory system: Secondary | ICD-10-CM | POA: Diagnosis not present

## 2020-04-06 DIAGNOSIS — Z809 Family history of malignant neoplasm, unspecified: Secondary | ICD-10-CM | POA: Diagnosis not present

## 2020-04-06 DIAGNOSIS — Z01812 Encounter for preprocedural laboratory examination: Secondary | ICD-10-CM | POA: Insufficient documentation

## 2020-04-06 DIAGNOSIS — Z825 Family history of asthma and other chronic lower respiratory diseases: Secondary | ICD-10-CM | POA: Diagnosis not present

## 2020-04-06 DIAGNOSIS — Z91040 Latex allergy status: Secondary | ICD-10-CM | POA: Diagnosis not present

## 2020-04-06 DIAGNOSIS — Z20822 Contact with and (suspected) exposure to covid-19: Secondary | ICD-10-CM | POA: Insufficient documentation

## 2020-04-06 DIAGNOSIS — Z882 Allergy status to sulfonamides status: Secondary | ICD-10-CM | POA: Diagnosis not present

## 2020-04-06 DIAGNOSIS — Z881 Allergy status to other antibiotic agents status: Secondary | ICD-10-CM | POA: Diagnosis not present

## 2020-04-06 DIAGNOSIS — Z887 Allergy status to serum and vaccine status: Secondary | ICD-10-CM | POA: Diagnosis not present

## 2020-04-06 DIAGNOSIS — Z888 Allergy status to other drugs, medicaments and biological substances status: Secondary | ICD-10-CM | POA: Diagnosis not present

## 2020-04-06 DIAGNOSIS — N840 Polyp of corpus uteri: Secondary | ICD-10-CM | POA: Diagnosis not present

## 2020-04-06 DIAGNOSIS — Z880 Allergy status to penicillin: Secondary | ICD-10-CM | POA: Diagnosis not present

## 2020-04-06 DIAGNOSIS — N950 Postmenopausal bleeding: Secondary | ICD-10-CM | POA: Diagnosis not present

## 2020-04-06 DIAGNOSIS — Z79899 Other long term (current) drug therapy: Secondary | ICD-10-CM | POA: Diagnosis not present

## 2020-04-06 DIAGNOSIS — Z820 Family history of epilepsy and other diseases of the nervous system: Secondary | ICD-10-CM | POA: Diagnosis not present

## 2020-04-06 DIAGNOSIS — Z803 Family history of malignant neoplasm of breast: Secondary | ICD-10-CM | POA: Diagnosis not present

## 2020-04-06 LAB — SARS CORONAVIRUS 2 (TAT 6-24 HRS): SARS Coronavirus 2: NEGATIVE

## 2020-04-07 NOTE — Anesthesia Preprocedure Evaluation (Addendum)
Anesthesia Evaluation   Patient awake    Reviewed: Allergy & Precautions, Patient's Chart, lab work & pertinent test results  Airway Mallampati: II  TM Distance: >3 FB     Dental   Pulmonary shortness of breath, sleep apnea and Continuous Positive Airway Pressure Ventilation ,    breath sounds clear to auscultation       Cardiovascular hypertension, + DOE   Rhythm:Regular Rate:Normal     Neuro/Psych  Headaches, PSYCHIATRIC DISORDERS Anxiety    GI/Hepatic negative GI ROS, Neg liver ROS,   Endo/Other  negative endocrine ROS  Renal/GU Renal disease     Musculoskeletal negative musculoskeletal ROS (+)   Abdominal (+) + obese,   Peds  Hematology  (+) anemia ,   Anesthesia Other Findings   Reproductive/Obstetrics                          Anesthesia Physical Anesthesia Plan  ASA: III  Anesthesia Plan: General   Post-op Pain Management:    Induction: Intravenous  PONV Risk Score and Plan: 4 or greater and Treatment may vary due to age or medical condition, Midazolam, Dexamethasone and Ondansetron  Airway Management Planned: LMA  Additional Equipment: None  Intra-op Plan:   Post-operative Plan: Extubation in OR  Informed Consent: I have reviewed the patients History and Physical, chart, labs and discussed the procedure including the risks, benefits and alternatives for the proposed anesthesia with the patient or authorized representative who has indicated his/her understanding and acceptance.     Dental advisory given  Plan Discussed with: CRNA and Anesthesiologist  Anesthesia Plan Comments:        Anesthesia Quick Evaluation

## 2020-04-09 ENCOUNTER — Ambulatory Visit (HOSPITAL_BASED_OUTPATIENT_CLINIC_OR_DEPARTMENT_OTHER)
Admission: RE | Admit: 2020-04-09 | Discharge: 2020-04-09 | Disposition: A | Discharge status: Home / Self Care | Payer: BC Managed Care – PPO | Attending: Obstetrics and Gynecology | Admitting: Obstetrics and Gynecology

## 2020-04-09 ENCOUNTER — Encounter (HOSPITAL_BASED_OUTPATIENT_CLINIC_OR_DEPARTMENT_OTHER): Payer: Self-pay | Admitting: Obstetrics and Gynecology

## 2020-04-09 ENCOUNTER — Ambulatory Visit (HOSPITAL_BASED_OUTPATIENT_CLINIC_OR_DEPARTMENT_OTHER): Payer: BC Managed Care – PPO | Admitting: Anesthesiology

## 2020-04-09 ENCOUNTER — Encounter (HOSPITAL_BASED_OUTPATIENT_CLINIC_OR_DEPARTMENT_OTHER)
Admission: RE | Disposition: A | Discharge status: Home / Self Care | Payer: Self-pay | Source: Home / Self Care | Attending: Obstetrics and Gynecology

## 2020-04-09 ENCOUNTER — Other Ambulatory Visit: Payer: Self-pay

## 2020-04-09 DIAGNOSIS — Z91040 Latex allergy status: Secondary | ICD-10-CM | POA: Diagnosis not present

## 2020-04-09 DIAGNOSIS — Z888 Allergy status to other drugs, medicaments and biological substances status: Secondary | ICD-10-CM | POA: Insufficient documentation

## 2020-04-09 DIAGNOSIS — Z825 Family history of asthma and other chronic lower respiratory diseases: Secondary | ICD-10-CM | POA: Diagnosis not present

## 2020-04-09 DIAGNOSIS — E559 Vitamin D deficiency, unspecified: Secondary | ICD-10-CM | POA: Diagnosis not present

## 2020-04-09 DIAGNOSIS — Z881 Allergy status to other antibiotic agents status: Secondary | ICD-10-CM | POA: Diagnosis not present

## 2020-04-09 DIAGNOSIS — N950 Postmenopausal bleeding: Secondary | ICD-10-CM | POA: Insufficient documentation

## 2020-04-09 DIAGNOSIS — Z803 Family history of malignant neoplasm of breast: Secondary | ICD-10-CM | POA: Insufficient documentation

## 2020-04-09 DIAGNOSIS — Z885 Allergy status to narcotic agent status: Secondary | ICD-10-CM | POA: Insufficient documentation

## 2020-04-09 DIAGNOSIS — Z20822 Contact with and (suspected) exposure to covid-19: Secondary | ICD-10-CM | POA: Insufficient documentation

## 2020-04-09 DIAGNOSIS — Z79899 Other long term (current) drug therapy: Secondary | ICD-10-CM | POA: Diagnosis not present

## 2020-04-09 DIAGNOSIS — Z887 Allergy status to serum and vaccine status: Secondary | ICD-10-CM | POA: Diagnosis not present

## 2020-04-09 DIAGNOSIS — N840 Polyp of corpus uteri: Secondary | ICD-10-CM | POA: Diagnosis not present

## 2020-04-09 DIAGNOSIS — Z820 Family history of epilepsy and other diseases of the nervous system: Secondary | ICD-10-CM | POA: Insufficient documentation

## 2020-04-09 DIAGNOSIS — Z880 Allergy status to penicillin: Secondary | ICD-10-CM | POA: Insufficient documentation

## 2020-04-09 DIAGNOSIS — Z882 Allergy status to sulfonamides status: Secondary | ICD-10-CM | POA: Diagnosis not present

## 2020-04-09 DIAGNOSIS — Z809 Family history of malignant neoplasm, unspecified: Secondary | ICD-10-CM | POA: Diagnosis not present

## 2020-04-09 DIAGNOSIS — Z8249 Family history of ischemic heart disease and other diseases of the circulatory system: Secondary | ICD-10-CM | POA: Insufficient documentation

## 2020-04-09 DIAGNOSIS — G4733 Obstructive sleep apnea (adult) (pediatric): Secondary | ICD-10-CM | POA: Diagnosis not present

## 2020-04-09 DIAGNOSIS — Z9989 Dependence on other enabling machines and devices: Secondary | ICD-10-CM | POA: Diagnosis not present

## 2020-04-09 HISTORY — PX: HYSTEROSCOPY WITH D & C: SHX1775

## 2020-04-09 HISTORY — DX: Reserved for inherently not codable concepts without codable children: IMO0001

## 2020-04-09 HISTORY — DX: Polyp of corpus uteri: N84.0

## 2020-04-09 HISTORY — DX: Procedure and treatment not carried out because of patient's decision for reasons of belief and group pressure: Z53.1

## 2020-04-09 HISTORY — DX: Atrioventricular block, first degree: I44.0

## 2020-04-09 HISTORY — DX: Obstructive sleep apnea (adult) (pediatric): G47.33

## 2020-04-09 HISTORY — DX: Stress incontinence (female) (male): N39.3

## 2020-04-09 HISTORY — DX: Headache, unspecified: R51.9

## 2020-04-09 HISTORY — DX: Personal history of adenomatous and serrated colon polyps: Z86.0101

## 2020-04-09 HISTORY — DX: Other forms of dyspnea: R06.09

## 2020-04-09 HISTORY — DX: Chronic rhinitis: J31.0

## 2020-04-09 HISTORY — DX: Dyspnea, unspecified: R06.00

## 2020-04-09 HISTORY — DX: Personal history of other specified conditions: Z87.898

## 2020-04-09 HISTORY — DX: Dependence on other enabling machines and devices: Z99.89

## 2020-04-09 HISTORY — DX: Calculus of kidney: N20.0

## 2020-04-09 HISTORY — DX: Personal history of colonic polyps: Z86.010

## 2020-04-09 HISTORY — DX: Hyperlipidemia, unspecified: E78.5

## 2020-04-09 HISTORY — DX: Postmenopausal bleeding: N95.0

## 2020-04-09 HISTORY — DX: Personal history of urinary calculi: Z87.442

## 2020-04-09 LAB — POCT I-STAT, CHEM 8
BUN: 17 mg/dL (ref 8–23)
Calcium, Ion: 1.3 mmol/L (ref 1.15–1.40)
Chloride: 105 mmol/L (ref 98–111)
Creatinine, Ser: 1 mg/dL (ref 0.44–1.00)
Glucose, Bld: 111 mg/dL — ABNORMAL HIGH (ref 70–99)
HCT: 44 % (ref 36.0–46.0)
Hemoglobin: 15 g/dL (ref 12.0–15.0)
Potassium: 4.1 mmol/L (ref 3.5–5.1)
Sodium: 142 mmol/L (ref 135–145)
TCO2: 26 mmol/L (ref 22–32)

## 2020-04-09 LAB — NO BLOOD PRODUCTS

## 2020-04-09 SURGERY — DILATATION AND CURETTAGE /HYSTEROSCOPY
Anesthesia: General | Site: Vagina

## 2020-04-09 MED ORDER — LIDOCAINE 2% (20 MG/ML) 5 ML SYRINGE
INTRAMUSCULAR | Status: AC
  Filled 2020-04-09: qty 5

## 2020-04-09 MED ORDER — MIDAZOLAM HCL 5 MG/5ML IJ SOLN
INTRAMUSCULAR | Status: DC | PRN
  Administered 2020-04-09: 2 mg via INTRAVENOUS

## 2020-04-09 MED ORDER — FENTANYL CITRATE (PF) 100 MCG/2ML IJ SOLN
INTRAMUSCULAR | Status: DC | PRN
  Administered 2020-04-09 (×2): 50 ug via INTRAVENOUS

## 2020-04-09 MED ORDER — DEXAMETHASONE SODIUM PHOSPHATE 10 MG/ML IJ SOLN
INTRAMUSCULAR | Status: AC
  Filled 2020-04-09: qty 1

## 2020-04-09 MED ORDER — ONDANSETRON HCL 4 MG/2ML IJ SOLN
INTRAMUSCULAR | Status: AC
  Filled 2020-04-09: qty 2

## 2020-04-09 MED ORDER — POVIDONE-IODINE 10 % EX SWAB: 2.0000 "application " | Freq: Once | CUTANEOUS | Status: DC

## 2020-04-09 MED ORDER — OXYCODONE HCL 5 MG/5ML PO SOLN: 5.0000 mg | Freq: Once | ORAL | Status: AC | PRN

## 2020-04-09 MED ORDER — ONDANSETRON HCL 4 MG/2ML IJ SOLN: 4.0000 mg | Freq: Once | INTRAMUSCULAR | Status: DC | PRN

## 2020-04-09 MED ORDER — LACTATED RINGERS IV SOLN: INTRAVENOUS | Status: DC

## 2020-04-09 MED ORDER — KETOROLAC TROMETHAMINE 30 MG/ML IJ SOLN: 30.0000 mg | Freq: Once | INTRAMUSCULAR | Status: DC | PRN

## 2020-04-09 MED ORDER — OXYCODONE HCL 5 MG PO TABS
ORAL_TABLET | ORAL | Status: AC
  Filled 2020-04-09: qty 1

## 2020-04-09 MED ORDER — HYDROMORPHONE HCL 1 MG/ML IJ SOLN
INTRAMUSCULAR | Status: AC
  Filled 2020-04-09: qty 1

## 2020-04-09 MED ORDER — DEXAMETHASONE SODIUM PHOSPHATE 4 MG/ML IJ SOLN
INTRAMUSCULAR | Status: DC | PRN
  Administered 2020-04-09: 4 mg via INTRAVENOUS

## 2020-04-09 MED ORDER — FENTANYL CITRATE (PF) 100 MCG/2ML IJ SOLN
INTRAMUSCULAR | Status: AC
  Filled 2020-04-09: qty 2

## 2020-04-09 MED ORDER — HYDROMORPHONE HCL 1 MG/ML IJ SOLN
0.2500 mg | INTRAMUSCULAR | Status: DC | PRN
  Administered 2020-04-09: 0.25 mg via INTRAVENOUS

## 2020-04-09 MED ORDER — ONDANSETRON HCL 4 MG/2ML IJ SOLN
INTRAMUSCULAR | Status: DC | PRN
  Administered 2020-04-09: 4 mg via INTRAVENOUS

## 2020-04-09 MED ORDER — AMISULPRIDE (ANTIEMETIC) 5 MG/2ML IV SOLN: 10.0000 mg | Freq: Once | INTRAVENOUS | Status: DC | PRN

## 2020-04-09 MED ORDER — OXYCODONE HCL 5 MG PO TABS
5.0000 mg | ORAL_TABLET | Freq: Once | ORAL | Status: AC | PRN
  Administered 2020-04-09: 5 mg via ORAL

## 2020-04-09 MED ORDER — LIDOCAINE 2% (20 MG/ML) 5 ML SYRINGE
INTRAMUSCULAR | Status: DC | PRN
  Administered 2020-04-09: 60 mg via INTRAVENOUS

## 2020-04-09 MED ORDER — KETOROLAC TROMETHAMINE 30 MG/ML IJ SOLN
INTRAMUSCULAR | Status: DC | PRN
  Administered 2020-04-09: 30 mg via INTRAVENOUS

## 2020-04-09 MED ORDER — PROPOFOL 10 MG/ML IV BOLUS
INTRAVENOUS | Status: DC | PRN
  Administered 2020-04-09: 180 mg via INTRAVENOUS

## 2020-04-09 MED ORDER — PROPOFOL 10 MG/ML IV BOLUS
INTRAVENOUS | Status: AC
  Filled 2020-04-09: qty 20

## 2020-04-09 MED ORDER — KETOROLAC TROMETHAMINE 30 MG/ML IJ SOLN
INTRAMUSCULAR | Status: AC
  Filled 2020-04-09: qty 1

## 2020-04-09 MED ORDER — MIDAZOLAM HCL 2 MG/2ML IJ SOLN
INTRAMUSCULAR | Status: AC
  Filled 2020-04-09: qty 2

## 2020-04-09 SURGICAL SUPPLY — 14 items
COVER WAND RF STERILE (DRAPES) ×1 IMPLANT
DEVICE MYOSURE REACH (MISCELLANEOUS) ×1 IMPLANT
GLOVE SURG POLYISO LF SZ6.5 (GLOVE) ×2 IMPLANT
GLOVE SURG UNDER POLY LF SZ7 (GLOVE) ×3 IMPLANT
GOWN STRL REUS W/TWL LRG LVL3 (GOWN DISPOSABLE) ×3 IMPLANT
IV NS IRRIG 3000ML ARTHROMATIC (IV SOLUTION) ×2 IMPLANT
KIT PROCEDURE FLUENT (KITS) ×2 IMPLANT
KIT TURNOVER CYSTO (KITS) ×2 IMPLANT
PACK VAGINAL MINOR WOMEN LF (CUSTOM PROCEDURE TRAY) ×2 IMPLANT
PAD OB MATERNITY 4.3X12.25 (PERSONAL CARE ITEMS) ×2 IMPLANT
PAD PREP 24X48 CUFFED NSTRL (MISCELLANEOUS) ×2 IMPLANT
SEAL ROD LENS SCOPE MYOSURE (ABLATOR) ×2 IMPLANT
SYR 20ML LL LF (SYRINGE) IMPLANT
TOWEL OR 17X26 10 PK STRL BLUE (TOWEL DISPOSABLE) ×2 IMPLANT

## 2020-04-09 NOTE — Interval H&P Note (Signed)
History and Physical Interval Note:  04/09/2020 10:26 AM  Sherri Howard  has presented today for surgery, with the diagnosis of Postmenopausal bleeding, endometrial polyp.  The various methods of treatment have been discussed with the patient and family. After consideration of risks, benefits and other options for treatment, the patient has consented to  Procedure(s): DILATATION AND CURETTAGE /HYSTEROSCOPY (N/A) as a surgical intervention.  The patient's history has been reviewed, patient examined, no change in status, stable for surgery.  I have reviewed the patient's chart and labs.  Questions were answered to the patient's satisfaction.     Salvadore Dom

## 2020-04-09 NOTE — Discharge Instructions (Signed)
DISCHARGE INSTRUCTIONS: D&C / D&E The following instructions have been prepared to help you care for yourself upon your return home.   Personal hygiene: Marland Kitchen Use sanitary pads for vaginal drainage, not tampons. . Shower the day after your procedure. . NO tub baths, pools or Jacuzzis for 2-3 weeks. . Wipe front to back after using the bathroom.  Activity and limitations: . Do NOT drive or operate any equipment for 24 hours. The effects of anesthesia are still present and drowsiness may result. . Do NOT rest in bed all day. . Walking is encouraged. . Walk up and down stairs slowly. . You may resume your normal activity in one to two days or as indicated by your physician.  Sexual activity: NO intercourse for at least 2 weeks after the procedure, or as indicated by your physician.  Diet: Eat a light meal as desired this evening. You may resume your usual diet tomorrow.  Return to work: You may resume your work activities in one to two days or as indicated by your doctor.  What to expect after your surgery: Expect to have vaginal bleeding/discharge for 2-3 days and spotting for up to 10 days. It is not unusual to have soreness for up to 1-2 weeks. You may have a slight burning sensation when you urinate for the first day. Mild cramps may continue for a couple of days. You may have a regular period in 2-6 weeks.  Call your doctor for any of the following: . Excessive vaginal bleeding, saturating and changing one pad every hour. . Inability to urinate 6 hours after discharge from hospital. . Pain not relieved by pain medication. . Fever of 100.4 F or greater. . Unusual vaginal discharge or odor.   Post Anesthesia Home Care Instructions  Activity: Get plenty of rest for the remainder of the day. A responsible individual must stay with you for 24 hours following the procedure.  For the next 24 hours, DO NOT: -Drive a car -Paediatric nurse -Drink alcoholic beverages -Take any medication  unless instructed by your physician -Make any legal decisions or sign important papers.  Meals: Start with liquid foods such as gelatin or soup. Progress to regular foods as tolerated. Avoid greasy, spicy, heavy foods. If nausea and/or vomiting occur, drink only clear liquids until the nausea and/or vomiting subsides. Call your physician if vomiting continues.  Special Instructions/Symptoms: Your throat may feel dry or sore from the anesthesia or the breathing tube placed in your throat during surgery. If this causes discomfort, gargle with warm salt water. The discomfort should disappear within 24 hours.  No ibuprofen, Advil, Aleve, Motrin, or naproxen until after 5:00 pm today if needed.

## 2020-04-09 NOTE — Op Note (Signed)
Preoperative Diagnosis: Postmenopausal bleeding, endometrial polyp  Postoperative Diagnosis: same  Procedure: Hysteroscopy, polypectomy, dilation and curettage  Surgeon: Dr Sumner Boast  Assistants: None  Anesthesia: General via LMA  EBL: 5 cc  Fluids: 600 cc LR  Fluid deficit: 20 cc  Urine output: not recorded  Indications for surgery: The patient is a 65 yo female, who presented with postmenopausal spotting. Work up included a normal pap smear and an endometrial biopsy with endometrial polyp.  The risks of the surgery were reviewed with the patient and the consent form was signed prior to her surgery.  Findings: EUA: normal sized uterus, no adnexal masses. Hysteroscopy: 3 polyps, otherwise thin endometrium. Normal tubal ostia bilaterally  Specimens: endometrial polyp, endometrial curettings   Procedure: The patient was taken to the operating room with an IV in place. She was placed in the dorsal lithotomy position and anesthesia was administered. She was prepped and draped in the usual sterile fashion for a vaginal procedure. She voided on the way to the OR. A weighted speculum was placed in the vagina and a single tooth tenaculum was placed on the anterior lip of the cervix. The cervix was dilated to a #19 pratt dilator. The uterus was sounded to 8 cm. The myosure hysteroscope was inserted into the uterine cavity. With continuous infusion of normal saline, the uterine cavity was visualized with the above findings. The myosure reach was used to resect the polyps. The myosure was then removed. The cavity was then curetted with the small sharp curette. The cavity had the characteristically gritty texture at the end of the procedure. The curette and the single tooth tenaculum were removed. The patients perineum was cleansed of betadine and she was taken out of the dorsal lithotomy position.  Upon awakening the LMA was removed and the patient was transferred to the recovery room in stable  and awake condition.  The sponge and instrument count were correct. There were no complications.

## 2020-04-10 ENCOUNTER — Encounter (HOSPITAL_BASED_OUTPATIENT_CLINIC_OR_DEPARTMENT_OTHER): Payer: Self-pay | Admitting: Obstetrics and Gynecology

## 2020-04-10 ENCOUNTER — Other Ambulatory Visit: Payer: BC Managed Care – PPO | Admitting: Obstetrics and Gynecology

## 2020-04-10 ENCOUNTER — Encounter: Payer: Self-pay | Admitting: Obstetrics and Gynecology

## 2020-04-10 ENCOUNTER — Other Ambulatory Visit: Payer: BC Managed Care – PPO

## 2020-04-10 LAB — SURGICAL PATHOLOGY

## 2020-04-10 NOTE — Transfer of Care (Signed)
Immediate Anesthesia Transfer of Care Note  Patient: Sherri Howard  Procedure(s) Performed: DILATATION AND CURETTAGE /HYSTEROSCOPY (N/A Vagina )  Patient Location: PACU  Anesthesia Type:General  Level of Consciousness: awake  Airway & Oxygen Therapy: Patient Spontanous Breathing and Patient connected to nasal cannula oxygen  Post-op Assessment: Report given to RN and Post -op Vital signs reviewed and stable  Post vital signs: Reviewed and stable  Last Vitals:  Vitals Value Taken Time  BP 124/73 04/09/20 1145  Temp 36.6 C 04/09/20 1145  Pulse 86 04/09/20 1146  Resp 16 04/09/20 1146  SpO2 95 % 04/09/20 1146  Vitals shown include unvalidated device data.  Last Pain:  Vitals:   04/09/20 1206  TempSrc:   PainSc: 2          Complications: No complications documented.

## 2020-04-10 NOTE — Anesthesia Procedure Notes (Addendum)
Procedure Name: LMA Insertion Date/Time: 04/09/2020 10:42 AM Performed by: Lieutenant Diego, CRNA Pre-anesthesia Checklist: Patient identified, Emergency Drugs available, Suction available and Patient being monitored Patient Re-evaluated:Patient Re-evaluated prior to induction Oxygen Delivery Method: Circle system utilized Preoxygenation: Pre-oxygenation with 100% oxygen Induction Type: IV induction Ventilation: Mask ventilation without difficulty LMA: LMA inserted LMA Size: 4.0 Number of attempts: 1 Placement Confirmation: positive ETCO2 and breath sounds checked- equal and bilateral Tube secured with: Tape Dental Injury: Teeth and Oropharynx as per pre-operative assessment

## 2020-04-12 NOTE — Anesthesia Postprocedure Evaluation (Signed)
Anesthesia Post Note  Patient: Sherri Howard  Procedure(s) Performed: DILATATION AND CURETTAGE /HYSTEROSCOPY (N/A Vagina )     Anesthesia Post Evaluation No complications documented.  Last Vitals:  Vitals:   04/09/20 1130 04/09/20 1145  BP: 116/77 124/73  Pulse: 84 81  Resp: 13 19  Temp:  36.6 C  SpO2: 93% 94%    Last Pain:  Vitals:   04/09/20 1206  TempSrc:   PainSc: 2                  Caedyn Tassinari

## 2020-04-24 ENCOUNTER — Ambulatory Visit (HOSPITAL_COMMUNITY): Payer: BC Managed Care – PPO | Attending: Internal Medicine

## 2020-04-24 ENCOUNTER — Other Ambulatory Visit: Payer: Self-pay

## 2020-04-24 ENCOUNTER — Ambulatory Visit (INDEPENDENT_AMBULATORY_CARE_PROVIDER_SITE_OTHER)
Admission: RE | Admit: 2020-04-24 | Discharge: 2020-04-24 | Disposition: A | Discharge status: Home / Self Care | Payer: Self-pay | Source: Ambulatory Visit | Attending: Cardiology | Admitting: Cardiology

## 2020-04-24 DIAGNOSIS — R0600 Dyspnea, unspecified: Secondary | ICD-10-CM | POA: Diagnosis not present

## 2020-04-24 DIAGNOSIS — R0609 Other forms of dyspnea: Secondary | ICD-10-CM

## 2020-04-24 DIAGNOSIS — R002 Palpitations: Secondary | ICD-10-CM

## 2020-04-24 LAB — ECHOCARDIOGRAM COMPLETE
Area-P 1/2: 4.39 cm2
S' Lateral: 3 cm

## 2020-04-25 ENCOUNTER — Other Ambulatory Visit: Payer: Self-pay

## 2020-04-25 ENCOUNTER — Ambulatory Visit: Payer: BC Managed Care – PPO | Admitting: Obstetrics and Gynecology

## 2020-04-25 DIAGNOSIS — E785 Hyperlipidemia, unspecified: Secondary | ICD-10-CM

## 2020-04-25 DIAGNOSIS — Z79899 Other long term (current) drug therapy: Secondary | ICD-10-CM

## 2020-04-25 MED ORDER — ROSUVASTATIN CALCIUM 20 MG PO TABS: 20.0000 mg | ORAL_TABLET | Freq: Every day | ORAL | 1 refills | Status: DC

## 2020-04-25 NOTE — Progress Notes (Signed)
GYNECOLOGY  VISIT   HPI: 65 y.o.   Married White or Caucasian Not Hispanic or Latino  female   G1P1001 with No LMP recorded. Patient is postmenopausal.   here for 2 week post op D&C/hysteroscopy/polypectomy 04/09/2020. Pathology with benign polyps.   GYNECOLOGIC HISTORY: No LMP recorded. Patient is postmenopausal. Contraception: Postmenopausal Menopausal hormone therapy: None        OB History    Gravida  1   Para  1   Term  1   Preterm      AB      Living  1     SAB      IAB      Ectopic      Multiple      Live Births  1              Patient Active Problem List   Diagnosis Date Noted  . Vaginal discharge 03/13/2020  . Vaginal bleeding 03/13/2020  . Pruritic rash 11/24/2019  . Pruritus 11/24/2019  . Chronic rhinitis 11/24/2019  . Multiple drug allergies 11/24/2019  . Mild obesity 08/13/2016  . Encounter for screening colonoscopy 07/17/2016  . Depression, recurrent ([place]) 02/28/2016  . Pyelonephritis 11/09/2015  . Right ureteral calculus 11/07/2015  . Encounter for opiate analgesic use agreement 09/20/2015  . Pain medication agreement signed 09/20/2015  . Acute bilateral low back pain without sciatica 08/24/2015  . Fatigue 08/09/2015  . Palpitations 06/26/2015  . Hydronephrosis 06/18/2015  . Abdominal pain 03/02/2015  . Dyspnea 03/02/2015  . Benign essential hypertension 12/20/2014  . Chronic recurrent major depressive disorder ([place]) 12/20/2014  . Dyslipidemia 12/20/2014  . Generalized anxiety disorder 12/20/2014  . Irritable bowel syndrome 12/20/2014  . Morbid obesity with BMI of 40.0-44.9, adult ([place]) 12/20/2014  . OSA on CPAP 12/20/2014  . Lactose intolerance 12/20/2014  . Insomnia 12/20/2014  . Vitamin D deficiency 12/20/2014  . Urinary incontinence 12/20/2014  . Other specified postprocedural states 07/31/2014  . S/P herniorrhaphy 07/31/2014  . Ventral incisional hernia 06/16/2014  . History of colectomy 09/07/2012  . Adenomatous colon  polyp 07/21/2012  . History of renal calculi 07/21/2012    Past Medical History:  Diagnosis Date  . Anemia   . Anxiety   . Chronic rhinitis    evaluated by dr y. Maudie Mercury ([facility]) note in epic 11-24-2019  . Depression   . DOE (dyspnea on exertion) 04-05-2020  per pt occasional gets sob sitting, lying, getting up and down, and with exertion   04-05-2020 pt pcp sent to cardiologist,  dr Stanford Breed for evalution, note in epic 03-28-2020 (ordered echo and cardiac CT and they have been scheduled  . Endometrial polyp   . First degree heart block   . Headache   . History of adenomatous polyp of colon   . History of kidney stones   . History of palpitations    previous had event monitor 2017 showed SR/ first degree heart block, no arrhythmia's and normal echo  . History of seizure    ED visit 08-22-2014 in epic for possible seizure, per ed note adverse reaction to prednisone stimulant   . Hyperlipidemia   . Hypertension    followed by pcp   and cardiology (nuclear stress test 01-11- 2018 in epic, normal perfusion no ischemia with normal LV function and wall motion, nuclear ef 74%)  . IBS (irritable bowel syndrome)   . Nephrolithiasis    04-05-2020 per pt bilateral nonobstructive  . OSA on CPAP  study in epic 08-31-1998 mild osa, recommended cpap  . PMB (postmenopausal bleeding)   . Refusal of blood transfusions as patient is Jehovah's Witness   . SUI (stress urinary incontinence, female)     Past Surgical History:  Procedure Laterality Date  . BREAST SURGERY  1974   lumpectomy, per pt benign  . CYSTOSCOPY/RETROGRADE/URETEROSCOPY  02-01-2008  @[facility]   . CYSTOSCOPY/URETEROSCOPY/HOLMIUM LASER/STENT PLACEMENT  2008;  11-07-2015 @[facility]   . HYSTEROSCOPY WITH D & C N/A 04/09/2020   Procedure: DILATATION AND CURETTAGE /HYSTEROSCOPY;  Surgeon: Salvadore Dom, MD;  Location: [facility];  Service: Gynecology;  Laterality: N/A;  . LAPAROSCOPIC CHOLECYSTECTOMY  2014   . LAPAROSCOPIC ILEOCECECTOMY  09/07/2012   @ [facility]   W/  APPENDECTOMY (done for large cecal polyp, non-malignant)  . OVARIAN CYST SURGERY  1988   unsure which side  . TONSILLECTOMY  05/1973  . VENTRAL HERNIA REPAIR  07-31-2014   @[facility]    incisional  (open)    Current Outpatient Medications  Medication Sig Dispense Refill  . acetaminophen (TYLENOL) 325 MG tablet Take 650 mg by mouth every 6 (six) hours as needed for mild pain or moderate pain.     Marland Kitchen ALPRAZolam (XANAX) 0.5 MG tablet Take 0.5-1 tablets (0.25-0.5 mg total) by mouth daily as needed for anxiety. May take extra tablet during the day prn panic. 45 tablet 5  . amLODipine (NORVASC) 10 MG tablet Take 1 tablet (10 mg total) by mouth daily. (Patient taking differently: Take 10 mg by mouth daily.) 180 tablet 3  . betamethasone valerate ointment (VALISONE) 0.1 % Use a pea sized amount topically BID for 1-2 weeks as needed (Patient not taking: No sig reported) 30 g 0  . busPIRone (BUSPAR) 10 MG tablet Take 2 tablets (20 mg total) by mouth 2 (two) times daily. FOR ANXIETY 360 tablet 3  . carvedilol (COREG) 6.25 MG tablet Take 1 tablet (6.25 mg total) by mouth 2 (two) times daily. (Patient taking differently: Take 6.25 mg by mouth 2 (two) times daily.) 180 tablet 3  . hyoscyamine (LEVSIN SL) 0.125 MG SL tablet Take 1 tablet (0.125 mg total) by mouth every 6 (six) hours as needed (schedule OV for IBS-D if needing frequently). 30 tablet 0  . losartan (COZAAR) 100 MG tablet Take 1 tablet (100 mg total) by mouth daily. (Patient taking differently: Take 100 mg by mouth daily.) 90 tablet 3  . rosuvastatin (CRESTOR) 20 MG tablet Take 1 tablet (20 mg total) by mouth daily. 90 tablet 1  . VIIBRYD 40 MG TABS Take 1 tablet (40 mg total) by mouth daily. (Patient taking differently: Take 40 mg by mouth daily. FOR DEPRESSION) 90 tablet 1   No current facility-administered medications for this visit.     ALLERGIES: Ciprofloxacin, Clindamycin, Influenza  vaccines, Latex, Other, Temazepam, Enablex [darifenacin hydrobromide er], Hydroxyzine hcl, Prednisolone, Amoxicillin, Oxycodone, Pantoprazole, Pneumococcal vaccine, Sulfa antibiotics, and Tape  Family History  Problem Relation Age of Onset  . Cancer Mother   . Aneurysm Mother   . Allergic rhinitis Mother   . Breast cancer Mother 64  . Atrial fibrillation Mother   . Cancer Father   . Hypertension Father   . Alzheimer's disease Father   . CAD Father   . Allergic rhinitis Brother   . COPD Maternal Aunt   . Eczema Son   . Bronchitis Son   . Sinusitis Son   . Asthma Neg Hx   . Urticaria Neg Hx   . Immunodeficiency Neg  Hx   . Atopy Neg Hx   . Angioedema Neg Hx     Social History   Socioeconomic History  . Marital status: Married    Spouse name: Not on file  . Number of children: 1  . Years of education: Not on file  . Highest education level: Not on file  Occupational History  . Not on file  Tobacco Use  . Smoking status: Never Smoker  . Smokeless tobacco: Never Used  Vaping Use  . Vaping Use: Never used  Substance and Sexual Activity  . Alcohol use: Not Currently    Comment: Rare  . Drug use: Never  . Sexual activity: Not on file  Other Topics Concern  . Not on file  Social History Narrative  . Not on file   Social Determinants of Health   Financial Resource Strain: Not on file  Food Insecurity: Not on file  Transportation Needs: Not on file  Physical Activity: Not on file  Stress: Not on file  Social Connections: Not on file  Intimate Partner Violence: Not on file    ROS  PHYSICAL EXAMINATION:    There were no vitals taken for this visit.    General appearance: alert, cooperative and appears stated age Abdomen: soft, non-tender; non distended, no masses,  no organomegaly   1. History of hysteroscopy Doing well, benign pathology. Routine f/u

## 2020-04-26 ENCOUNTER — Encounter: Payer: Self-pay | Admitting: Obstetrics and Gynecology

## 2020-04-26 ENCOUNTER — Other Ambulatory Visit: Payer: Self-pay

## 2020-04-26 ENCOUNTER — Telehealth: Payer: Self-pay | Admitting: Cardiology

## 2020-04-26 ENCOUNTER — Ambulatory Visit (INDEPENDENT_AMBULATORY_CARE_PROVIDER_SITE_OTHER): Payer: BC Managed Care – PPO | Admitting: Obstetrics and Gynecology

## 2020-04-26 VITALS — BP 128/80 | Ht 65.0 in | Wt 274.0 lb

## 2020-04-26 DIAGNOSIS — Z9889 Other specified postprocedural states: Secondary | ICD-10-CM | POA: Diagnosis not present

## 2020-04-26 NOTE — Telephone Encounter (Signed)
Patient called to go over her results from her procedure on 4/12. She has question/concerns on the medication

## 2020-04-26 NOTE — Telephone Encounter (Signed)
Pt updated with test results along with MD's recommendations. Pt verbalized understanding.  Sherri Perla, MD  04/25/2020 1:17 PM EDT      Minimally elevated Ca score; add crestor 20 mg daily to decrease risk of progession; lipids and liver 12 weeks [facility]s

## 2020-05-01 ENCOUNTER — Other Ambulatory Visit: Payer: Self-pay

## 2020-05-01 MED ORDER — CARVEDILOL 6.25 MG PO TABS: 6.2500 mg | ORAL_TABLET | Freq: Two times a day (BID) | ORAL | 3 refills | Status: DC

## 2020-05-01 MED ORDER — AMLODIPINE BESYLATE 10 MG PO TABS: 10.0000 mg | ORAL_TABLET | Freq: Every day | ORAL | 3 refills | Status: DC

## 2020-05-01 MED ORDER — ROSUVASTATIN CALCIUM 20 MG PO TABS
20.0000 mg | ORAL_TABLET | Freq: Every day | ORAL | 3 refills | Status: DC
Start: 2020-05-01 — End: 2021-04-30

## 2020-05-03 ENCOUNTER — Other Ambulatory Visit: Payer: Self-pay

## 2020-05-03 ENCOUNTER — Telehealth: Payer: Self-pay

## 2020-05-03 DIAGNOSIS — Z1231 Encounter for screening mammogram for malignant neoplasm of breast: Secondary | ICD-10-CM

## 2020-05-03 NOTE — Telephone Encounter (Signed)
Patient states that she is still having issues with eyes swelling.  Patient would like to get a new CPAP machine

## 2020-05-03 NOTE — Telephone Encounter (Signed)
Spoke with patient, has appointment with [facility] for the swelling. She will call to see if she can expedite the appointment. Would like to know if Dr Lajuana Ripple can write for her a new CPAP machine with attachments.

## 2020-05-06 ENCOUNTER — Encounter: Payer: Self-pay | Admitting: Family Medicine

## 2020-05-08 ENCOUNTER — Other Ambulatory Visit: Payer: Self-pay | Admitting: Family Medicine

## 2020-05-08 DIAGNOSIS — Z9989 Dependence on other enabling machines and devices: Secondary | ICD-10-CM

## 2020-05-08 DIAGNOSIS — G4733 Obstructive sleep apnea (adult) (pediatric): Secondary | ICD-10-CM

## 2020-05-08 NOTE — Telephone Encounter (Signed)
Yes CPAP has been ordered.  Is there a specific company that she uses so that we can send to the correct place?  Please let Alyssa know so that she may fax this ASAP

## 2020-05-09 NOTE — Telephone Encounter (Signed)
Pt called would like CPAP order mailed to her, she may not use [facility] this time with her order

## 2020-05-23 ENCOUNTER — Encounter: Payer: Self-pay | Admitting: Family Medicine

## 2020-05-31 ENCOUNTER — Encounter: Payer: Self-pay | Admitting: Internal Medicine

## 2020-05-31 ENCOUNTER — Telehealth: Payer: Self-pay | Admitting: Internal Medicine

## 2020-05-31 ENCOUNTER — Ambulatory Visit (INDEPENDENT_AMBULATORY_CARE_PROVIDER_SITE_OTHER): Payer: BC Managed Care – PPO | Admitting: Internal Medicine

## 2020-05-31 ENCOUNTER — Other Ambulatory Visit: Payer: Self-pay

## 2020-05-31 DIAGNOSIS — R0609 Other forms of dyspnea: Secondary | ICD-10-CM

## 2020-05-31 DIAGNOSIS — Z9989 Dependence on other enabling machines and devices: Secondary | ICD-10-CM

## 2020-05-31 DIAGNOSIS — G4733 Obstructive sleep apnea (adult) (pediatric): Secondary | ICD-10-CM

## 2020-05-31 DIAGNOSIS — R0600 Dyspnea, unspecified: Secondary | ICD-10-CM

## 2020-05-31 MED ORDER — FAMOTIDINE 20 MG PO TABS: ORAL_TABLET | ORAL | 1 refills | Status: DC

## 2020-05-31 NOTE — Assessment & Plan Note (Signed)
ERV 30% @ 08/13/2016 pft's mild restriction s obst  @ wt  273    Body mass index is 46.89 kg/m.  -  Trending  up since 2018 when dx of OSA  Lab Results  Component Value Date   TSH 1.190 11/07/2019     Contributing to gerd risk/ doe/reviewed the need and the process to achieve and maintain neg calorie balance > defer f/u primary care including intermittently monitoring thyroid status

## 2020-05-31 NOTE — Progress Notes (Signed)
Sherri Howard, female    DOB: [birth year]    MRN: [id number]   Brief patient profile:  73 yowf never smoker seasonal rhinitis as child fall > spring worse over the years eval Kim/Padget neg allergy  then around 2021 in spring noted doe also sometimes sitting still and freq wakes up with throat closing so referred to pulmonary clinic in [place]  05/31/2020 byAshly Gottshchalk      wt  273   with ERV 30% @ 08/13/2016 pft   History of Present Illness  05/31/2020  Pulmonary/ 1st office eval/ Sherri Howard / [facility]  Chief Complaint  Patient presents with  . Consult    Intermittent Shortness of breath at rest and with activity since May 2021  Dyspnea:  Slow pace / 3 aisles and has to stop due to sob > back  Cough: freq throat clearing treats with mint chocolate   Sleep: flat/ two pillows  SABA use: none  Allergy eval 11/24/19 : ? Allergy to red meat/ nl spirometry on a typical day for her in terms of doe   No obvious day to day or daytime variability or assoc excess/ purulent sputum or mucus plugs or hemoptysis or cp or chest tightness, subjective wheeze or overt sinus or hb symptoms.     Also denies any obvious fluctuation of symptoms with weather or environmental changes or other aggravating or alleviating factors except as outlined above   No unusual exposure hx or h/o childhood pna/ asthma or knowledge of premature birth.  Current Allergies, Complete Past Medical History, Past Surgical History, Family History, and Social History were reviewed in [employer] record.  ROS  The following are not active complaints unless bolded Hoarseness, sore throat, dysphagia, dental problems, itching, sneezing,  nasal congestion or discharge of excess mucus or purulent secretions, ear ache,   fever, chills, sweats, unintended wt loss or wt gain, classically pleuritic or exertional cp,  orthopnea pnd or arm/hand swelling  or leg swelling, presyncope, palpitations, abdominal pain,  anorexia, nausea, vomiting, diarrhea  or change in bowel habits or change in bladder habits, change in stools or change in urine, dysuria, hematuria,  rash, arthralgias, visual complaints, headache, numbness, weakness or ataxia or problems with walking or coordination,  change in mood or  memory.                Past Medical History:  Diagnosis Date  . Anemia   . Anxiety   . Chronic rhinitis    evaluated by dr y. Maudie Mercury ([facility]) note in epic 11-24-2019  . Depression   . DOE (dyspnea on exertion) 04-05-2020  per pt occasional gets sob sitting, lying, getting up and down, and with exertion   04-05-2020 pt pcp sent to cardiologist,  dr Stanford Breed for evalution, note in epic 03-28-2020 (ordered echo and cardiac CT and they have been scheduled  . Endometrial polyp   . First degree heart block   . Headache   . History of adenomatous polyp of colon   . History of kidney stones   . History of palpitations    previous had event monitor 2017 showed SR/ first degree heart block, no arrhythmia's and normal echo  . History of seizure    ED visit 08-22-2014 in epic for possible seizure, per ed note adverse reaction to prednisone stimulant   . Hyperlipidemia   . Hypertension    followed by pcp   and cardiology (nuclear stress test 01-11- 2018 in  epic, normal perfusion no ischemia with normal LV function and wall motion, nuclear ef 74%)  . IBS (irritable bowel syndrome)   . Nephrolithiasis    04-05-2020 per pt bilateral nonobstructive  . OSA on CPAP    study in epic 08-31-1998 mild osa, recommended cpap  . PMB (postmenopausal bleeding)   . Refusal of blood transfusions as patient is Jehovah's Witness   . SUI (stress urinary incontinence, female)     Outpatient Medications Prior to Visit  Medication Sig Dispense Refill  . acetaminophen (TYLENOL) 325 MG tablet Take 650 mg by mouth every 6 (six) hours as needed for mild pain or moderate pain.     Marland Kitchen ALPRAZolam (XANAX) 0.5 MG tablet  Take 0.5-1 tablets (0.25-0.5 mg total) by mouth daily as needed for anxiety. May take extra tablet during the day prn panic. 45 tablet 5  . amLODipine (NORVASC) 10 MG tablet Take 1 tablet (10 mg total) by mouth daily. 90 tablet 3  . busPIRone (BUSPAR) 10 MG tablet Take 2 tablets (20 mg total) by mouth 2 (two) times daily. FOR ANXIETY 360 tablet 3  . carvedilol (COREG) 6.25 MG tablet Take 1 tablet (6.25 mg total) by mouth 2 (two) times daily. 180 tablet 3  . hyoscyamine (LEVSIN SL) 0.125 MG SL tablet Take 1 tablet (0.125 mg total) by mouth every 6 (six) hours as needed (schedule OV for IBS-D if needing frequently). 30 tablet 0  . losartan (COZAAR) 100 MG tablet Take 1 tablet (100 mg total) by mouth daily. (Patient taking differently: Take 100 mg by mouth daily.) 90 tablet 3  . rosuvastatin (CRESTOR) 20 MG tablet Take 1 tablet (20 mg total) by mouth daily. 90 tablet 3  . VIIBRYD 40 MG TABS Take 1 tablet (40 mg total) by mouth daily. (Patient taking differently: Take 40 mg by mouth daily. FOR DEPRESSION) 90 tablet 1      Objective:     BP 126/80 (BP Location: Left Arm, Cuff Size: Normal)   Pulse 69   Temp 97.6 F (36.4 C) (Temporal)   Ht 5\' 5"  (1.651 m)   Wt 281 lb 12.8 oz (127.8 kg)   SpO2 95% Comment: Room air  BMI 46.89 kg/m   SpO2: 95 % (Room air)   amb obese wf with occ vigorous throat clearing      HEENT : pt wearing mask not removed for exam due to covid -19 concerns.    NECK :  without JVD/Nodes/TM/ nl carotid upstrokes bilaterally   LUNGS: no acc muscle use,  Nl contour chest which is clear to A and P bilaterally without cough on insp or exp maneuvers   CV:  RRR  no s3 or murmur or increase in P2, and no edema   ABD:  Obese soft and nontender with nl inspiratory excursion in the supine position. No bruits or organomegaly appreciated, bowel sounds nl  MS:  Nl gait/ ext warm without deformities, calf tenderness, cyanosis or clubbing No obvious joint restrictions    SKIN: warm and dry without lesions    NEURO:  alert, approp, nl sensorium with  no motor or cerebellar deficits apparent.   I personally reviewed images and agree with radiology impression as follows:   Chest CT chest cuts on coronary study  04/24/20:  wnl     Assessment   DOE (dyspnea on exertion) Onset 2018   -  ERV 30% @ 08/13/2016 pft's mild restriction s obst  @ wt  273      -  allergy eval neg 11/2019 Kim with nl spirometry  - echo 04/24/20  C/w Grade 1  diastolic dysfunction  -  07/19/2374   Walked RA  approx   600 ft  @ mod fast pace  stopped due to  End of study but "felt a little dizzy and sob" at 500 ft with lowest sats 97%    Symptoms of resting sob and doe x  3 aisles at grocery store  are markedly disproportionate to objective findings and not clear to what extent this is actually a pulmonary  problem but pt does appear to have difficult to sort out respiratory symptoms of unknown origin for which  DDX  = almost all start with A and  include Adherence, Ace Inhibitors, Acid Reflux, Active Sinus Disease, Alpha 1 Antitripsin deficiency, Anxiety masquerading as Airways dz,  ABPA,  Allergy(esp in young), Aspiration (esp in elderly), Adverse effects of meds,  Active smoking or Vaping, A bunch of PE's/clot burden (a few small clots can't cause this syndrome unless there is already severe underlying pulm or vascular dz with poor reserve),  Anemia or thyroid disorder, plus two Bs  = Bronchiectasis and Beta blocker use..and one C= CHF   Adherence is always the initial "prime suspect" and is a multilayered concern that requires a "trust but verify" approach in every patient - starting with knowing how to use medications, especially inhalers, correctly, keeping up with refills and understanding the fundamental difference between maintenance and prns vs those medications only taken for a very short course and then stopped and not refilled.   ? Acid (or non-acid) GERD > always difficult to exclude  as up to 75% of pts in some series report no assoc GI/ Heartburn symptoms> rec max (24h)  acid suppression and diet restrictions/ reviewed and instructions given in writing.   ? Anxiety/decondtioning/ wt gain  > usually at the bottom of this list of usual suspects but should be much higher on this pt's based on H and P and note already on psychotropics and may interfere with adherence and also interpretation of response or lack thereof to symptom management which can be quite subjective.   ? Allergy/ asthma > ruled out to the extent possible > f/u allergy prn   ? Anemia/ thryroid dz   Lab Results  Component Value Date   HGB 15.0 04/09/2020   HGB 13.7 11/07/2019   HGB 14.6 03/23/2019   HGB 14.1 08/21/2017   Lab Results  Component Value Date   TSH 1.190 11/07/2019     ? chf > cards w/u reviewed as above       Morbid obesity due to excess calories ([place])  ERV 30% @ 08/13/2016 pft's mild restriction s obst  @ wt  273    Body mass index is 46.89 kg/m.  -  Trending  up since 2018 when dx of OSA  Lab Results  Component Value Date   TSH 1.190 11/07/2019     Contributing to gerd risk/ doe/reviewed the need and the process to achieve and maintain neg calorie balance > defer f/u primary care including intermittently monitoring thyroid status      OSA on CPAP Referred back to Dr Elsworth Soho 05/31/2020 >>>   In meantime advised on max  rx for gerd which may lead to poorer tolerance and asked her to try to follow prior instructions as completely as possible pending f/u with sleep medicine  Each maintenance medication was reviewed in detail including emphasizing most importantly the difference  between maintenance and prns and under what circumstances the prns are to be triggered using an action plan format where appropriate.  Total time for H and P, chart review, counseling, reviewing cpap device(s) , directly observing portions of ambulatory 02 saturation study/ and generating customized AVS  unique to this office visit / same day charting > 60 min                Christinia Gully, MD 05/31/2020

## 2020-05-31 NOTE — Patient Instructions (Addendum)
To get the most out of exercise, you need to be continuously aware that you are short of breath, but never out of breath, for at least 30 minutes daily. As you improve, it will actually be easier for you to do the same amount of exercise  in  30 minutes so always push to the level where you are short of breath.  Once you can do this, push for longer duration or repeat it after at least 4 hours of rest.  Make sure you check your oxygen saturation at your highest level of activity to be sure it stays over 90% and keep track of it at least once a week, more often if breathing getting worse, and let me know if losing ground.   Pantoprazole (protonix) 40 mg   Take  30-60 min before first meal of the day and Pepcid (famotidine)  20 mg after supper until return to office - this is the best way to tell whether stomach acid is contributing to your problem.     GERD (REFLUX)  is an extremely common cause of respiratory symptoms just like yours , many times with no obvious heartburn at all.    It can be treated with medication, but also with lifestyle changes including elevation of the head of your bed (ideally with 6 -8inch blocks under the headboard of your bed),  Smoking cessation, avoidance of late meals, excessive alcohol, and avoid fatty foods, chocolate, peppermint, colas, red wine, and acidic juices such as orange juice.  NO MINT OR MENTHOL PRODUCTS SO NO COUGH DROPS  USE SUGARLESS CANDY INSTEAD (Jolley ranchers or Stover's or Life Savers) or even ice chips will also do - the key is to swallow to prevent all throat clearing. NO OIL BASED VITAMINS - use powdered substitutes.  Avoid fish oil when coughing.   We schedule you to see Dr Elsworth Soho next available

## 2020-05-31 NOTE — Telephone Encounter (Signed)
Called and went over Dr Gustavus Bryant recommendations to change Rx to Pepcid and d/c protonix with patient. All questions answered and patient expressed full understanding of recommendation and how and when to take pepcid with teach back. Writer called [facility] to confirm change in order for pepcid to be BID and Pharmacist Librarian, academic) acknowledged new order being placed and stated they will fill new script.   Went over the questions that patient had sent on Mychart about allergy that we were concerned with (pantoprazole) and over the pepcid being ordered by Dr Melvyn Novas and how to take it. Patient expressed full understanding and stated all her questions were answered and that there was nothing further needed at this time. Will close encounter.

## 2020-05-31 NOTE — Telephone Encounter (Signed)
Change rx to pepcid 20 mg bid (after bfast and supper) d/c protonix

## 2020-05-31 NOTE — Assessment & Plan Note (Signed)
Onset 2018   -  ERV 30% @ 08/13/2016 pft's mild restriction s obst  @ wt  273      - allergy eval neg 11/2019 Kim with nl spirometry  - echo 04/24/20  C/w Grade 1  diastolic dysfunction  -  01/15/7251   Walked RA  approx   600 ft  @ mod fast pace  stopped due to  End of study but "felt a little dizzy and sob" at 500 ft with lowest sats 97%    Symptoms of resting sob and doe x  3 aisles at grocery store  are markedly disproportionate to objective findings and not clear to what extent this is actually a pulmonary  problem but pt does appear to have difficult to sort out respiratory symptoms of unknown origin for which  DDX  = almost all start with A and  include Adherence, Ace Inhibitors, Acid Reflux, Active Sinus Disease, Alpha 1 Antitripsin deficiency, Anxiety masquerading as Airways dz,  ABPA,  Allergy(esp in young), Aspiration (esp in elderly), Adverse effects of meds,  Active smoking or Vaping, A bunch of PE's/clot burden (a few small clots can't cause this syndrome unless there is already severe underlying pulm or vascular dz with poor reserve),  Anemia or thyroid disorder, plus two Bs  = Bronchiectasis and Beta blocker use..and one C= CHF   Adherence is always the initial "prime suspect" and is a multilayered concern that requires a "trust but verify" approach in every patient - starting with knowing how to use medications, especially inhalers, correctly, keeping up with refills and understanding the fundamental difference between maintenance and prns vs those medications only taken for a very short course and then stopped and not refilled.   ? Acid (or non-acid) GERD > always difficult to exclude as up to 75% of pts in some series report no assoc GI/ Heartburn symptoms> rec max (24h)  acid suppression and diet restrictions/ reviewed and instructions given in writing.   ? Anxiety/decondtioning/ wt gain  > usually at the bottom of this list of usual suspects but should be much higher on this pt's based  on H and P and note already on psychotropics and may interfere with adherence and also interpretation of response or lack thereof to symptom management which can be quite subjective.   ? Allergy/ asthma > ruled out to the extent possible > f/u allergy prn   ? Anemia/ thryroid dz   Lab Results  Component Value Date   HGB 15.0 04/09/2020   HGB 13.7 11/07/2019   HGB 14.6 03/23/2019   HGB 14.1 08/21/2017   Lab Results  Component Value Date   TSH 1.190 11/07/2019     ? chf > cards w/u reviewed as above

## 2020-05-31 NOTE — Assessment & Plan Note (Signed)
Referred back to Dr Elsworth Soho 05/31/2020 >>>   In meantime advised on max  rx for gerd which may lead to poorer tolerance and asked her to try to follow prior instructions as completely as possible pending f/u with sleep medicine  Each maintenance medication was reviewed in detail including emphasizing most importantly the difference between maintenance and prns and under what circumstances the prns are to be triggered using an action plan format where appropriate.  Total time for H and P, chart review, counseling, reviewing cpap device(s) , directly observing portions of ambulatory 02 saturation study/ and generating customized AVS unique to this office visit / same day charting > 60 min

## 2020-05-31 NOTE — Telephone Encounter (Signed)
Called and spoke with patient who states she needs protonix script sent in to [facility] in [place]. Writer noted that patient has allergy listed to pantoprazole. Pepcid order placed per verbal from Dr Melvyn Novas. Will route to Dr Melvyn Novas to please advise as to what to be ordered in place of pantoprazole. When attempted Prilosec order per verbal from Dr Melvyn Novas, same allergy warning appeared. Patient stated she does not currently take prilosec. Dr Melvyn Novas please advise.

## 2020-05-31 NOTE — Telephone Encounter (Signed)
Please see telephone note from today 05/31/20, all mychart questions and concerns from today were answered over the phone. Nothing further needed at this time.

## 2020-06-13 ENCOUNTER — Other Ambulatory Visit: Payer: Self-pay

## 2020-06-13 ENCOUNTER — Other Ambulatory Visit: Payer: BC Managed Care – PPO

## 2020-06-13 DIAGNOSIS — Z79899 Other long term (current) drug therapy: Secondary | ICD-10-CM | POA: Diagnosis not present

## 2020-06-13 DIAGNOSIS — E785 Hyperlipidemia, unspecified: Secondary | ICD-10-CM | POA: Diagnosis not present

## 2020-06-14 LAB — LIPID PANEL
Chol/HDL Ratio: 2.3 ratio (ref 0.0–4.4)
Cholesterol, Total: 121 mg/dL (ref 100–199)
HDL: 53 mg/dL (ref >39)
LDL Chol Calc (NIH): 48 mg/dL (ref 0–99)
Triglycerides: 109 mg/dL (ref 0–149)
VLDL Cholesterol Cal: 20 mg/dL (ref 5–40)

## 2020-06-14 LAB — HEPATIC FUNCTION PANEL
ALT: 36 IU/L — ABNORMAL HIGH (ref 0–32)
AST: 35 IU/L (ref 0–40)
Albumin: 4.6 g/dL (ref 3.8–4.8)
Alkaline Phosphatase: 63 IU/L (ref 44–121)
Bilirubin Total: 0.5 mg/dL (ref 0.0–1.2)
Bilirubin, Direct: 0.2 mg/dL (ref 0.00–0.40)
Total Protein: 7 g/dL (ref 6.0–8.5)

## 2020-06-14 NOTE — Progress Notes (Signed)
HPI: Follow-up dyspnea. Nuclear study 2018 showed ejection fraction 74% and normal perfusion.  Echocardiogram April 2022 showed normal LV function, mild left ventricular hypertrophy, grade 1 diastolic dysfunction, trace aortic insufficiency.  Calcium score April 2022 was 4 which was 50th percentile; aortic atherosclerosis noted.  Since last seen she has dyspnea on exertion but no orthopnea or PND.  Occasional minimal pedal edema.  She denies chest pain or syncope.  Current Outpatient Medications  Medication Sig Dispense Refill   acetaminophen (TYLENOL) 325 MG tablet Take 650 mg by mouth every 6 (six) hours as needed for mild pain or moderate pain.      ALPRAZolam (XANAX) 0.5 MG tablet Take 0.5-1 tablets (0.25-0.5 mg total) by mouth daily as needed for anxiety. May take extra tablet during the day prn panic. 45 tablet 5   amLODipine (NORVASC) 10 MG tablet Take 1 tablet (10 mg total) by mouth daily. 90 tablet 3   busPIRone (BUSPAR) 10 MG tablet Take 2 tablets (20 mg total) by mouth 2 (two) times daily. FOR ANXIETY 360 tablet 3   carvedilol (COREG) 6.25 MG tablet Take 1 tablet (6.25 mg total) by mouth 2 (two) times daily. 180 tablet 3   famotidine (PEPCID) 20 MG tablet Take one tablet (20mg ) by mouth twice a day 60 tablet 1   hyoscyamine (LEVSIN SL) 0.125 MG SL tablet Take 1 tablet (0.125 mg total) by mouth every 6 (six) hours as needed (schedule OV for IBS-D if needing frequently). 30 tablet 0   losartan (COZAAR) 100 MG tablet Take 1 tablet (100 mg total) by mouth daily. (Patient taking differently: Take 100 mg by mouth daily.) 90 tablet 3   rosuvastatin (CRESTOR) 20 MG tablet Take 1 tablet (20 mg total) by mouth daily. 90 tablet 3   VIIBRYD 40 MG TABS Take 1 tablet (40 mg total) by mouth daily. (Patient taking differently: Take 40 mg by mouth daily. FOR DEPRESSION) 90 tablet 1   No current facility-administered medications for this visit.     Past Medical History:  Diagnosis Date    Anemia    Anxiety    Chronic rhinitis    evaluated by dr y. Maudie [facility] ([facility] note in epic 11-24-2019   Depression    DOE (dyspnea on exertion) 04-05-2020  per pt occasional gets sob sitting, lying, getting up and down, and with exertion   04-05-2020 pt pcp sent to cardiologist,  dr Stanford Breed for evalution, note in epic 03-28-2020 (ordered echo and cardiac CT and they have been scheduled   Endometrial polyp    First degree heart block    Headache    History of adenomatous polyp of colon    History of kidney stones    History of palpitations    previous had event monitor 2017 showed SR/ first degree heart block, no arrhythmia's and normal echo   History of seizure    ED visit 08-22-2014 in epic for possible seizure, per ed note adverse reaction to prednisone stimulant    Hyperlipidemia    Hypertension    followed by pcp   and cardiology (nuclear stress test 01-11- 2018 in epic, normal perfusion no ischemia with normal LV function and wall motion, nuclear ef 74%)   IBS (irritable bowel syndrome)    Nephrolithiasis    04-05-2020 per pt bilateral nonobstructive   OSA on CPAP    study in epic 08-31-1998 mild osa, recommended cpap   PMB (postmenopausal bleeding)    Refusal  of blood transfusions as patient is Jehovah's Witness    SUI (stress urinary incontinence, female)     Past Surgical History:  Procedure Laterality Date   BREAST SURGERY  1974   lumpectomy, per pt benign   CYSTOSCOPY/RETROGRADE/URETEROSCOPY  02-01-2008  @[facility]    CYSTOSCOPY/URETEROSCOPY/HOLMIUM LASER/STENT PLACEMENT  2008;  11-07-2015 @[facility]    HYSTEROSCOPY WITH D & C N/A 04/09/2020   Procedure: DILATATION AND CURETTAGE /HYSTEROSCOPY;  Surgeon: Salvadore Dom, MD;  Location: [facility];  Service: Gynecology;  Laterality: N/A;   LAPAROSCOPIC CHOLECYSTECTOMY  2014   LAPAROSCOPIC ILEOCECECTOMY  09/07/2012   @ [facility]   W/  APPENDECTOMY (done for large cecal polyp, non-malignant)   OVARIAN  CYST SURGERY  1988   unsure which side   TONSILLECTOMY  05/1973   VENTRAL HERNIA REPAIR  07-31-2014   @[facility]    incisional  (open)    Social History   Socioeconomic History   Marital status: Married    Spouse name: Not on file   Number of children: 1   Years of education: Not on file   Highest education level: Not on file  Occupational History   Not on file  Tobacco Use   Smoking status: Never   Smokeless tobacco: Never  Vaping Use   Vaping Use: Never used  Substance and Sexual Activity   Alcohol use: Not Currently    Comment: Rare   Drug use: Never   Sexual activity: Not on file  Other Topics Concern   Not on file  Social History Narrative   Not on file   Social Determinants of Health   Financial Resource Strain: Not on file  Food Insecurity: Not on file  Transportation Needs: Not on file  Physical Activity: Not on file  Stress: Not on file  Social Connections: Not on file  Intimate Partner Violence: Not on file    Family History  Problem Relation Age of Onset   Cancer Mother    Aneurysm Mother    Allergic rhinitis Mother    Breast cancer Mother 21   Atrial fibrillation Mother    Cancer Father    Hypertension Father    Alzheimer's disease Father    CAD Father    Allergic rhinitis Brother    COPD Maternal Aunt    Eczema Son    Bronchitis Son    Sinusitis Son    Asthma Neg Hx    Urticaria Neg Hx    Immunodeficiency Neg Hx    Atopy Neg Hx    Angioedema Neg Hx     ROS: no fevers or chills, productive cough, hemoptysis, dysphasia, odynophagia, melena, hematochezia, dysuria, hematuria, rash, seizure activity, orthopnea, PND, claudication. Remaining systems are negative.  Physical Exam: Well-developed obese in no acute distress.  Skin is warm and dry.  HEENT is normal.  Neck is supple.  Chest is clear to auscultation with normal expansion.  Cardiovascular exam is regular rate and rhythm.  Abdominal exam nontender or distended. No masses  palpated. Extremities show no edema. neuro grossly intact  A/P  1 dyspnea-echocardiogram showed normal LV function and calcium score minimally elevated.  As outlined in previous notes she does have sleep apnea and has not been using CPAP.  She will follow-up with pulmonary for this issue.  There may also be a contribution from obesity/deconditioning.  2 hypertension-blood pressure controlled.  Continue present medications.  3 obesity-we discussed importance of weight loss.  4 hyperlipidemia-continue statin.  Kirk Ruths, MD

## 2020-06-27 ENCOUNTER — Other Ambulatory Visit: Payer: Self-pay

## 2020-06-27 ENCOUNTER — Encounter: Payer: Self-pay | Admitting: Cardiology

## 2020-06-27 ENCOUNTER — Ambulatory Visit (INDEPENDENT_AMBULATORY_CARE_PROVIDER_SITE_OTHER): Payer: BC Managed Care – PPO | Admitting: Cardiology

## 2020-06-27 VITALS — BP 125/81 | HR 76 | Ht 65.0 in | Wt 279.4 lb

## 2020-06-27 DIAGNOSIS — R0600 Dyspnea, unspecified: Secondary | ICD-10-CM

## 2020-06-27 DIAGNOSIS — I10 Essential (primary) hypertension: Secondary | ICD-10-CM

## 2020-06-27 DIAGNOSIS — E785 Hyperlipidemia, unspecified: Secondary | ICD-10-CM | POA: Diagnosis not present

## 2020-06-27 DIAGNOSIS — R0609 Other forms of dyspnea: Secondary | ICD-10-CM

## 2020-06-27 NOTE — Patient Instructions (Signed)

## 2020-07-05 ENCOUNTER — Telehealth: Payer: Self-pay | Admitting: Family Medicine

## 2020-07-05 NOTE — Telephone Encounter (Signed)
  Prescription Request  07/05/2020  What is the name of the medication or equipment? Hyoscyamine 0.125 mg and Alprazolam 0.5 mg. Patient has appt  07-31-2020 and needs enough to get there through to her appt.  Have you contacted your pharmacy to request a refill? (if applicable) NO  Which [facility] [place]   Patient notified that their request is being sent to the clinical staff for review and that they should receive a response within 2 business days.

## 2020-07-06 MED ORDER — HYOSCYAMINE SULFATE 0.125 MG SL SUBL: 0.1250 mg | SUBLINGUAL_TABLET | Freq: Four times a day (QID) | SUBLINGUAL | 0 refills | Status: DC | PRN

## 2020-07-06 NOTE — Telephone Encounter (Signed)
Pt aware refill on the Levsin sent to pharmacy, not able to send in the Xanax, it is a controlled substance & she has a ned & can only be refilled during a visit

## 2020-07-07 ENCOUNTER — Other Ambulatory Visit: Payer: Self-pay | Admitting: Family Medicine

## 2020-07-07 DIAGNOSIS — F411 Generalized anxiety disorder: Secondary | ICD-10-CM

## 2020-07-11 ENCOUNTER — Other Ambulatory Visit: Payer: Self-pay | Admitting: Family Medicine

## 2020-07-12 MED ORDER — HYOSCYAMINE SULFATE 0.125 MG SL SUBL: 0.1250 mg | SUBLINGUAL_TABLET | Freq: Four times a day (QID) | SUBLINGUAL | 0 refills | Status: DC | PRN

## 2020-07-17 ENCOUNTER — Ambulatory Visit (INDEPENDENT_AMBULATORY_CARE_PROVIDER_SITE_OTHER): Payer: BC Managed Care – PPO | Admitting: Pulmonary Disease

## 2020-07-17 ENCOUNTER — Encounter: Payer: Self-pay | Admitting: Pulmonary Disease

## 2020-07-17 ENCOUNTER — Other Ambulatory Visit: Payer: Self-pay

## 2020-07-17 VITALS — BP 128/80 | HR 75 | Temp 97.7°F | Ht 65.0 in | Wt 281.6 lb

## 2020-07-17 DIAGNOSIS — G4733 Obstructive sleep apnea (adult) (pediatric): Secondary | ICD-10-CM

## 2020-07-17 DIAGNOSIS — Z9989 Dependence on other enabling machines and devices: Secondary | ICD-10-CM | POA: Diagnosis not present

## 2020-07-17 DIAGNOSIS — F5104 Psychophysiologic insomnia: Secondary | ICD-10-CM

## 2020-07-17 NOTE — Assessment & Plan Note (Signed)
I am not convinced that facial rash was related to CPAP mask, this occurred over her forehead also where the mask would not have been in contact We discussed alternative mask types Trial of Air fit N 30 & air fit P 10 - Rx to [place] pharmacy  OK to use Rem -zzz mask liners if you use nasal mask  CPAP is certainly improved her daytime somnolence and fatigue and she has been very compliant in the past. Weight loss encouraged, compliance with goal of at least 4-6 hrs every night is the expectation. Advised against medications with sedative side effects Cautioned against driving when sleepy - understanding that sleepiness will vary on a day to day basis

## 2020-07-17 NOTE — Patient Instructions (Addendum)
  Trial of Air fit N 30 & air fit P 10 - Rx to [place] s pharmacy  OK to use Rem -zzz mask liners if you use nasal mask  Rules of sleep hygiene were discussed  - light exercise -avoid caffeinated beverages - no more than 20 mins staying awake in bed, if not asleep, get out of bed & reading or light music - No TV or computer games at bedtime.  Wake up time at 8-30 pm Light exposure x 30 mins @ 9 a   We can refer you to insomnia therapist if needed Or try sleep coaching apps - Proper or Somryst

## 2020-07-17 NOTE — Progress Notes (Signed)
   Subjective:    [name], female    DOB: 1955/01/27, 65 y.o.   MRN: [id number]  HPI  65 year old woman presents for follow-up of OSA. She was evaluated by me 06/2016 for dyspnea on exertion, CT angiogram was negative for PE.  PFTs showed severe restriction suggestive obesity He was again evaluated by my partner 05/2020 wert for dyspnea, GERD treatment and referred for evaluation of OSA  Chief Complaint  Patient presents with   Consult    Patient has been diagnosed with OSA and has CPAP but has not been using it for awhile due to problems with her face. Used to cause swelling   She has OSA for many years or sooner diagnosed in 2000 maintained on CPAP of 7 cm with nasal mask DME [facility] She had good results with nasal mask however developed a rash 11/02/2019 over her face, nose and forehead-she underwent allergy evaluation and no clear cause was identified.  She felt this was related to CPAP mask and self discontinued.  She would like to discuss alternatives to the mask.  Currently CPAP has helped improve her daytime somnolence and fatigue. She reports longstanding insomnia she reports a feeling of her body being wound up & has to move around , toss and turn in bed.  She denies repeat carotid sensation in her legs.  She had minimal improvement with melatonin, has used Xanax in the past. Bedtime currently anywhere between 11 PM and 6 AM and wake up time can be as late as 12 noon.  She also shares her Fitbit data with me which shows restful sleep and lots of bowel movements   Significant tests/ events reviewed  Split study 2000 >> wt 240 lbs -RDI 28/hour, CPAP 7 cm Allergy eval 11/24/19 : ? Allergy to red meat/ nl spirometry  08/2016 PFTs ERV 30%, no obs, mild restriction  CTA chest 06/2016 neg, fatty liver CT angiogram 08/2012 negative for pulmonary emboli Nuclear stress test 01/2016 low risk, EF was 74%  Review of Systems neg for any significant sore throat,  dysphagia, itching, sneezing, nasal congestion or excess/ purulent secretions, fever, chills, sweats, unintended wt loss, pleuritic or exertional cp, hempoptysis, orthopnea pnd or change in chronic leg swelling. Also denies presyncope, palpitations, heartburn, abdominal pain, nausea, vomiting, diarrhea or change in bowel or urinary habits, dysuria,hematuria, rash, arthralgias, visual complaints, headache, numbness weakness or ataxia.     Objective:   Physical Exam  Gen. Pleasant, obese, in no distress ENT - no lesions, no post nasal drip Neck: No JVD, no thyromegaly, no carotid bruits Lungs: no use of accessory muscles, no dullness to percussion, decreased without rales or rhonchi  Cardiovascular: Rhythm regular, heart sounds  normal, no murmurs or gallops, no peripheral edema Musculoskeletal: No deformities, no cyanosis or clubbing , no tremors       Assessment & Plan:

## 2020-07-17 NOTE — Assessment & Plan Note (Signed)
Rules of sleep hygiene were discussed  - light exercise -avoid caffeinated beverages - no more than 20 mins staying awake in bed, if not asleep, get out of bed & reading or light music - No TV or computer games at bedtime.  Wake up time at 8-30 pm Light exposure x 30 mins @ 9 a   We can refer you to insomnia therapist if needed For cognitive behavioral therapy  or try sleep coaching apps - Proper or Somryst

## 2020-07-18 ENCOUNTER — Telehealth: Payer: Self-pay | Admitting: Family Medicine

## 2020-07-18 ENCOUNTER — Encounter: Payer: Self-pay | Admitting: Nurse Practitioner

## 2020-07-18 ENCOUNTER — Ambulatory Visit (INDEPENDENT_AMBULATORY_CARE_PROVIDER_SITE_OTHER): Payer: BC Managed Care – PPO | Admitting: Nurse Practitioner

## 2020-07-18 DIAGNOSIS — J029 Acute pharyngitis, unspecified: Secondary | ICD-10-CM

## 2020-07-18 LAB — RAPID STREP SCREEN (MED CTR MEBANE ONLY): Strep Gp A Ag, IA W/Reflex: NEGATIVE

## 2020-07-18 LAB — CULTURE, GROUP A STREP

## 2020-07-18 MED ORDER — AZITHROMYCIN 250 MG PO TABS: ORAL_TABLET | ORAL | 0 refills | Status: AC

## 2020-07-18 NOTE — Progress Notes (Signed)
   Virtual Visit  Note Due to COVID-19 pandemic this visit was conducted virtually. This visit type was conducted due to national recommendations for restrictions regarding the COVID-19 Pandemic (e.g. social distancing, sheltering in place) in an effort to limit this patient's exposure and mitigate transmission in our community. All issues noted in this document were discussed and addressed.  A physical exam was not performed with this format.  I connected with Sherri Howard on 07/18/20 at 9:01 AM by telephone and verified that I am speaking with the correct person using two identifiers. Sherri Howard is currently located at home during visit. The provider, Ivy Lynn, NP is located in their office at time of visit.  I discussed the limitations, risks, security and privacy concerns of performing an evaluation and management service by telephone and the availability of in person appointments. I also discussed with the patient that there may be a patient responsible charge related to this service. The patient expressed understanding and agreed to proceed.   History and Present Illness:  HPI    ROS   Observations/Objective: Televisit patient did not sound to be in distress  Assessment and Plan: Completed COVID-19 swab results pending.  Strep test completed results pending. Azithromycin, 250 mg tablet by mouth, first dose 500 mg day 1, 250 mg tablet day 2-5.  Tylenol/ibuprofen for pain, Chloraseptic spray.  Warm salt water gargle. Follow Up Instructions:  Follow-up with worsening unresolved symptoms.   I discussed the assessment and treatment plan with the patient. The patient was provided an opportunity to ask questions and all were answered. The patient agreed with the plan and demonstrated an understanding of the instructions.   The patient was advised to call back or seek an in-person evaluation if the symptoms worsen or if the condition fails to improve as anticipated.  The  above assessment and management plan was discussed with the patient. The patient verbalized understanding of and has agreed to the management plan. Patient is aware to call the clinic if symptoms persist or worsen. Patient is aware when to return to the clinic for a follow-up visit. Patient educated on when it is appropriate to go to the emergency department.   Time call ended: 9:11 AM  I provided 10 minutes of  non face-to-face time during this encounter.    Ivy Lynn, NP

## 2020-07-18 NOTE — Assessment & Plan Note (Signed)
Completed COVID-19 swab results pending.  Strep test completed results pending. Azithromycin, 250 mg tablet by mouth, first dose 500 mg day 1, 250 mg tablet day 2-5.  Tylenol/ibuprofen for pain, Chloraseptic spray.  Warm salt water gargle.

## 2020-07-18 NOTE — Telephone Encounter (Signed)
Calling to check on strep results

## 2020-07-19 LAB — SARS-COV-2, NAA 2 DAY TAT

## 2020-07-19 LAB — NOVEL CORONAVIRUS, NAA: SARS-CoV-2, NAA: NOT DETECTED

## 2020-07-25 ENCOUNTER — Other Ambulatory Visit: Payer: Self-pay

## 2020-07-25 MED ORDER — FAMOTIDINE 20 MG PO TABS: ORAL_TABLET | ORAL | 3 refills | Status: AC

## 2020-07-31 ENCOUNTER — Ambulatory Visit (INDEPENDENT_AMBULATORY_CARE_PROVIDER_SITE_OTHER): Payer: BC Managed Care – PPO | Admitting: Family Medicine

## 2020-07-31 ENCOUNTER — Other Ambulatory Visit: Payer: Self-pay

## 2020-07-31 ENCOUNTER — Encounter: Payer: Self-pay | Admitting: Family Medicine

## 2020-07-31 VITALS — BP 135/84 | HR 86 | Temp 97.8°F | Ht 65.0 in | Wt 285.6 lb

## 2020-07-31 DIAGNOSIS — F411 Generalized anxiety disorder: Secondary | ICD-10-CM

## 2020-07-31 DIAGNOSIS — F339 Major depressive disorder, recurrent, unspecified: Secondary | ICD-10-CM

## 2020-07-31 DIAGNOSIS — F5104 Psychophysiologic insomnia: Secondary | ICD-10-CM

## 2020-07-31 DIAGNOSIS — S99812A Other specified injuries of left ankle, initial encounter: Secondary | ICD-10-CM

## 2020-07-31 DIAGNOSIS — Z79899 Other long term (current) drug therapy: Secondary | ICD-10-CM

## 2020-07-31 MED ORDER — ALPRAZOLAM 0.5 MG PO TABS: 0.2500 mg | ORAL_TABLET | Freq: Every day | ORAL | 5 refills | Status: DC | PRN

## 2020-07-31 MED ORDER — VIIBRYD 40 MG PO TABS: 40.0000 mg | ORAL_TABLET | Freq: Every day | ORAL | 3 refills | Status: DC

## 2020-07-31 NOTE — Patient Instructions (Addendum)
We discussed that if your anxiety symptoms are not well controlled with buspirone 20 mg daily, consider adding 20 mg at lunchtime or increasing your morning dose to 30 mg (this would be 3 tablets).  Over time, you can develop a tolerance to benzodiazepines (Xanax) so would limit use as able  I think you are over supinating your ankle (this is stressing the ligaments on the outside).  A soft ankle brace would be helpful.  If you decide you want a referral to the foot/ ankle doctor for orthotics, let me know but you essentially need footwear that will "neutralize" your ankle so you are not walking on the outsides of your feet.

## 2020-07-31 NOTE — Progress Notes (Signed)
Subjective: CC: GAD PCP: [name], DO [facility]:QPRFF Sherri Howard is a 65 y.o. female presenting to clinic today for:  1. GAD with insomnia and depression Patient reports that multiple things have been going on that have been exacerbating her anxiety and depressive symptoms.  The most significant issue has been that her granddaughter, her first grandchild, was born and she was not informed for 2 days.  This really hurt her feelings as this is her only child's first child.  They had a pretty close relationship up until about mid pregnancy and things seem to change.  She is hoping that things will start improving for the better but she is still trying to deal with these emotions.  She also notes that her aunt, whom she has been very close with, was recently admitted to hospice and that her sister-in-law's health is declining.  Her sister-in-law suffers from advanced Alzheimer's and is starting to have some mood instability.  All these issues well on her quite a bit.  She has been compliant with her buspirone 20 mg but only in the morning and is not taking any evening doses she is sleeping better with melatonin alone.  She continues to use the Xanax as needed.  Denies any excessive daytime sedation, visual or auditory hallucinations.  No falls.  Compliant with her Viibryd, which she takes daily and does need a refill on.  2.  Ankle pain Patient reports intermittent ankle pain on the left.  No known preceding injury.  She points to the posterior lateral aspect of the left ankle as the source of pain.  Sometimes she gets swelling in bilateral legs but she has not noticed any gross swelling in that ankle as of late.  No reports of ecchymosis but wants to make sure that is not broken.  She is ambulating independently.  ROS: Per HPI  Allergies  Allergen Reactions   Ciprofloxacin Rash   Clindamycin Rash   Influenza Vaccines Rash   Latex Rash   Other     PER PT JEHOVAH WITNESS, BLOOD PRODUCT  REFUSAL   Temazepam Rash   Enablex [Darifenacin Hydrobromide Er] Swelling    Causes swelling of patient's tongue   Hydroxyzine Hcl     Per pt was given prior to scheduled tonsillectomy in 1975 and had convulsions under anesthesia   Prednisolone Other (See Comments)    Convulsions per pt   Amoxicillin Diarrhea   Oxycodone Rash    percocet   Pantoprazole Rash   Pneumococcal Vaccine Swelling and Rash    Lymph node swelling   Sulfa Antibiotics Rash   Tape Rash    Bandages, etc.   Past Medical History:  Diagnosis Date   Anemia    Anxiety    Chronic rhinitis    evaluated by dr y. Maudie Mercury ([facility]) note in epic 11-24-2019   Depression    DOE (dyspnea on exertion) 04-05-2020  per pt occasional gets sob sitting, lying, getting up and down, and with exertion   04-05-2020 pt pcp sent to cardiologist,  dr Stanford Breed for evalution, note in epic 03-28-2020 (ordered echo and cardiac CT and they have been scheduled   Endometrial polyp    First degree heart block    Headache    History of adenomatous polyp of colon    History of kidney stones    History of palpitations    previous had event monitor 2017 showed SR/ first degree heart block, no arrhythmia's and normal echo  History of seizure    ED visit 08-22-2014 in epic for possible seizure, per ed note adverse reaction to prednisone stimulant    Hyperlipidemia    Hypertension    followed by pcp   and cardiology (nuclear stress test 01-11- 2018 in epic, normal perfusion no ischemia with normal LV function and wall motion, nuclear ef 74%)   IBS (irritable bowel syndrome)    Nephrolithiasis    04-05-2020 per pt bilateral nonobstructive   OSA on CPAP    study in epic 08-31-1998 mild osa, recommended cpap   PMB (postmenopausal bleeding)    Refusal of blood transfusions as patient is Jehovah's Witness    SUI (stress urinary incontinence, female)     Current Outpatient Medications:    acetaminophen (TYLENOL) 325 MG tablet, Take  650 mg by mouth every 6 (six) hours as needed for mild pain or moderate pain. , Disp: , Rfl:    ALPRAZolam (XANAX) 0.5 MG tablet, Take 0.5-1 tablets (0.25-0.5 mg total) by mouth daily as needed for anxiety. May take extra tablet during the day prn panic., Disp: 45 tablet, Rfl: 5   amLODipine (NORVASC) 10 MG tablet, Take 1 tablet (10 mg total) by mouth daily., Disp: 90 tablet, Rfl: 3   busPIRone (BUSPAR) 10 MG tablet, Take 2 tablets (20 mg total) by mouth 2 (two) times daily. FOR ANXIETY, Disp: 360 tablet, Rfl: 3   carvedilol (COREG) 6.25 MG tablet, Take 1 tablet (6.25 mg total) by mouth 2 (two) times daily., Disp: 180 tablet, Rfl: 3   famotidine (PEPCID) 20 MG tablet, Take one tablet (20mg ) by mouth twice a day, Disp: 180 tablet, Rfl: 3   hyoscyamine (LEVSIN SL) 0.125 MG SL tablet, Take 1 tablet (0.125 mg total) by mouth every 6 (six) hours as needed (schedule OV for IBS-D if needing frequently)., Disp: 30 tablet, Rfl: 0   losartan (COZAAR) 100 MG tablet, Take 1 tablet (100 mg total) by mouth daily. (Patient taking differently: Take 100 mg by mouth daily.), Disp: 90 tablet, Rfl: 3   rosuvastatin (CRESTOR) 20 MG tablet, Take 1 tablet (20 mg total) by mouth daily., Disp: 90 tablet, Rfl: 3   VIIBRYD 40 MG TABS, TAKE 1 TABLET DAILY, Disp: 90 tablet, Rfl: 0 Social History   Socioeconomic History   Marital status: Married    Spouse name: Not on file   Number of children: 1   Years of education: Not on file   Highest education level: Not on file  Occupational History   Not on file  Tobacco Use   Smoking status: Never   Smokeless tobacco: Never  Vaping Use   Vaping Use: Never used  Substance and Sexual Activity   Alcohol use: Not Currently    Comment: Rare   Drug use: Never   Sexual activity: Not on file  Other Topics Concern   Not on file  Social History Narrative   Not on file   Social Determinants of Health   Financial Resource Strain: Not on file  Food Insecurity: Not on file   Transportation Needs: Not on file  Physical Activity: Not on file  Stress: Not on file  Social Connections: Not on file  Intimate Partner Violence: Not on file   Family History  Problem Relation Age of Onset   Cancer Mother    Aneurysm Mother    Allergic rhinitis Mother    Breast cancer Mother 1   Atrial fibrillation Mother    Cancer Father    Hypertension Father  Alzheimer's disease Father    CAD Father    Allergic rhinitis Brother    COPD Maternal Aunt    Eczema Son    Bronchitis Son    Sinusitis Son    Asthma Neg Hx    Urticaria Neg Hx    Immunodeficiency Neg Hx    Atopy Neg Hx    Angioedema Neg Hx     Objective: Office vital signs reviewed. BP 135/84   Pulse 86   Temp 97.8 F (36.6 C)   Ht 5\' 5"  (1.651 m)   Wt 285 lb 9.6 oz (129.5 kg)   SpO2 94%   BMI 47.53 kg/m   Physical Examination:  General: Awake, alert, obese, No acute distress Cardio: regular rate and rhythm, S1S2 heard, no murmurs appreciated Pulm: clear to auscultation bilaterally, no wheezes, rhonchi or rales; normal work of breathing on room air Extremities: warm, well perfused, no edema, cyanosis or clubbing; +2 pulses bilaterally MSK: Normal gait.  She has a visible supination of the ankles and associated wearing of the lateral aspects of her shoes  Left ankle: No gross swelling, erythema or warmth.  No appreciable deformities.  No tenderness palpation to the distal lateral malleolus or ATFL.  Area of concern was noted to be just inferior and posterior to the lateral malleolus.  No appreciable snapping of the ligaments appreciated.  Depression screen [facility] 2/9 07/31/2020 03/14/2020 03/13/2020  Decreased Interest 2 1 2   Down, Depressed, Hopeless 2 1 2   PHQ - 2 Score 4 2 4   Altered sleeping 3 1 2   Tired, decreased energy 3 2 2   Change in appetite 2 0 1  Feeling bad or failure about yourself  2 2 2   Trouble concentrating 2 2 1   Moving slowly or fidgety/restless 0 0 1  Suicidal thoughts 1 0 1   PHQ-9 Score 17 9 14   Difficult doing work/chores - - Not difficult at all  Some recent data might be hidden   GAD 7 : Generalized Anxiety Score 07/31/2020 03/14/2020 03/13/2020 02/29/2020  Nervous, Anxious, on Edge 1 2 2 2   Control/stop worrying 1 1 1 2   Worry too much - different things 1 2 1 1   Trouble relaxing 1 1 1 1   Restless 1 1 2  0  Easily annoyed or irritable 0 0 1 1  Afraid - awful might happen 1 0 0 0  Total GAD 7 Score 6 7 8 7   Anxiety Difficulty - Somewhat difficult Somewhat difficult Somewhat difficult     Assessment/ Plan: 65 y.o. 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  For now we will keep meds the same but may need to consider increasing her buspirone at some point if symptoms remain uncontrolled.  We discussed how to advance buspirone if needed.  National narcotic database reviewed and there were no red flags.  Controlled substance contract and drug screen have been completed as per office policy today.  Going forward she prefers venous stick over a urine  drug screen as this is less anxiety provoking  Continue melatonin as needed insomnia  I suspect that the intermittent left-sided ankle pain that she is experiencing is due to oversupination of the left ankle.  We discussed seeking neutralizing shoes versus seeing a foot and ankle doctor for custom orthotics.  In the meantime she may use a soft ankle  brace to stabilize ankle.  There was no evidence of fracture or significant sprain today.  No orders of the defined types were placed in this encounter.  No orders of the defined types were placed in this encounter.    [name], DO [place] [phone]

## 2020-08-03 ENCOUNTER — Telehealth: Payer: Self-pay

## 2020-08-03 NOTE — Telephone Encounter (Signed)
[facility] called to see if Dr. Lajuana Ripple would mind sending in generic Viibryd since pts insurance plan does not cover the brand name.  Informed that it would be fine to switch to generic Viibryd- Vilazodone. Same dosage, same amount of refills.

## 2020-08-10 ENCOUNTER — Encounter: Payer: Self-pay | Admitting: *Deleted

## 2020-08-10 LAB — DRUG SCREEN 10 W/CONF, SERUM
Amphetamines, IA: NEGATIVE ng/mL
Barbiturates, IA: NEGATIVE ug/mL
Benzodiazepines, IA: POSITIVE ng/mL — AB
Cocaine & Metabolite, IA: NEGATIVE ng/mL
Methadone, IA: NEGATIVE ng/mL
Opiates, IA: NEGATIVE ng/mL
Oxycodones, IA: NEGATIVE ng/mL
Phencyclidine, IA: NEGATIVE ng/mL
Propoxyphene, IA: NEGATIVE ng/mL
THC(Marijuana) Metabolite, IA: NEGATIVE ng/mL

## 2020-08-10 LAB — BENZODIAZEPINES,MS,WB/SP RFX
7-Aminoclonazepam: NEGATIVE ng/mL
Alprazolam: 15.5 ng/mL
Benzodiazepines Confirm: POSITIVE
Chlordiazepoxide: NEGATIVE
Clonazepam: NEGATIVE ng/mL
Desalkylflurazepam: NEGATIVE ng/mL
Desmethylchlordiazepoxide: NEGATIVE
Desmethyldiazepam: NEGATIVE ng/mL
Diazepam: NEGATIVE ng/mL
Flurazepam: NEGATIVE ng/mL
Lorazepam: NEGATIVE ng/mL
Midazolam: NEGATIVE ng/mL
Oxazepam: NEGATIVE ng/mL
Temazepam: NEGATIVE ng/mL
Triazolam: NEGATIVE ng/mL

## 2020-08-21 ENCOUNTER — Telehealth: Payer: Self-pay | Admitting: Pulmonary Disease

## 2020-08-21 DIAGNOSIS — G4733 Obstructive sleep apnea (adult) (pediatric): Secondary | ICD-10-CM

## 2020-08-21 NOTE — Telephone Encounter (Signed)
Call returned to patient, confirmed DOB. Patient states her previous machine she was using resmed so her provider could review her cpap compliance report. They are wanting to switch DME companies as well as get a new cpap machine because they are no longer hooked up to [facility]med. They are having to use a SD card and they are not pleased with the service from [facility].   RA please advise if we can send in new order for cpap machine.

## 2020-08-21 NOTE — Telephone Encounter (Signed)
Call made to patient, confirmed DOB. Made aware of RA recommendations. Voiced understanding. Agreeable to plan. Order for HST placed. Aware someone from our office would call them to get this scheduled.   Nothing further needed at this time.

## 2020-08-23 DIAGNOSIS — Z1231 Encounter for screening mammogram for malignant neoplasm of breast: Secondary | ICD-10-CM | POA: Diagnosis not present

## 2020-08-24 ENCOUNTER — Other Ambulatory Visit: Payer: Self-pay

## 2020-08-24 DIAGNOSIS — Z1231 Encounter for screening mammogram for malignant neoplasm of breast: Secondary | ICD-10-CM

## 2020-09-07 ENCOUNTER — Encounter: Payer: Self-pay | Admitting: Family Medicine

## 2020-09-14 ENCOUNTER — Other Ambulatory Visit: Payer: Self-pay | Admitting: Family Medicine

## 2020-10-09 ENCOUNTER — Ambulatory Visit: Payer: BC Managed Care – PPO

## 2020-10-09 ENCOUNTER — Other Ambulatory Visit: Payer: Self-pay

## 2020-10-09 DIAGNOSIS — G4733 Obstructive sleep apnea (adult) (pediatric): Secondary | ICD-10-CM | POA: Diagnosis not present

## 2020-10-11 ENCOUNTER — Other Ambulatory Visit: Payer: Self-pay

## 2020-10-11 ENCOUNTER — Telehealth: Payer: Self-pay | Admitting: Pulmonary Disease

## 2020-10-11 DIAGNOSIS — Z9989 Dependence on other enabling machines and devices: Secondary | ICD-10-CM

## 2020-10-11 DIAGNOSIS — G4733 Obstructive sleep apnea (adult) (pediatric): Secondary | ICD-10-CM | POA: Diagnosis not present

## 2020-10-11 NOTE — Telephone Encounter (Signed)
HST showed mod  OSA with AHI 17/ hr Please see last phone note  Please send Rx to DME for autoCPAP  5-12 cm, mask of choice  OV with me/APP in 6 wks after starting

## 2020-10-11 NOTE — Telephone Encounter (Signed)
Spoke with patient regarding HST results. They verbalized understanding.   Patient did state that she wanted to switch DME companies from [facility] in [place] to [facility] in [place]. Put that info in the order placed.   No further questions.  Nothing further needed at this time.

## 2020-10-15 ENCOUNTER — Telehealth: Payer: Self-pay | Admitting: Pulmonary Disease

## 2020-10-15 NOTE — Telephone Encounter (Signed)
HST results sent to [facility]. Nothing further needed at this time.

## 2020-10-16 ENCOUNTER — Telehealth: Payer: Self-pay | Admitting: Pulmonary Disease

## 2020-10-16 NOTE — Telephone Encounter (Signed)
HST results form 10/09/20 faxed. Ok status from fax

## 2020-10-24 ENCOUNTER — Ambulatory Visit: Payer: BC Managed Care – PPO | Admitting: Family Medicine

## 2020-10-24 ENCOUNTER — Encounter: Payer: Self-pay | Admitting: Nurse Practitioner

## 2020-10-24 ENCOUNTER — Other Ambulatory Visit: Payer: Self-pay

## 2020-10-24 ENCOUNTER — Ambulatory Visit (INDEPENDENT_AMBULATORY_CARE_PROVIDER_SITE_OTHER): Payer: BC Managed Care – PPO | Admitting: Nurse Practitioner

## 2020-10-24 VITALS — BP 125/72 | HR 81 | Temp 97.7°F | Ht 65.0 in | Wt 290.0 lb

## 2020-10-24 DIAGNOSIS — R197 Diarrhea, unspecified: Secondary | ICD-10-CM | POA: Insufficient documentation

## 2020-10-24 DIAGNOSIS — R609 Edema, unspecified: Secondary | ICD-10-CM | POA: Insufficient documentation

## 2020-10-24 DIAGNOSIS — R109 Unspecified abdominal pain: Secondary | ICD-10-CM

## 2020-10-24 LAB — MICROSCOPIC EXAMINATION
Renal Epithel, UA: NONE SEEN /hpf
WBC, UA: >30 /hpf — AB (ref 0–5)

## 2020-10-24 LAB — URINALYSIS, ROUTINE W REFLEX MICROSCOPIC
Bilirubin, UA: NEGATIVE
Glucose, UA: NEGATIVE
Nitrite, UA: NEGATIVE
Specific Gravity, UA: 1.02 (ref 1.005–1.030)
Urobilinogen, Ur: 0.2 mg/dL (ref 0.2–1.0)
pH, UA: 6 (ref 5.0–7.5)

## 2020-10-24 MED ORDER — LOPERAMIDE HCL 2 MG PO TABS: 2.0000 mg | ORAL_TABLET | Freq: Four times a day (QID) | ORAL | 0 refills | Status: DC | PRN

## 2020-10-24 MED ORDER — HYOSCYAMINE SULFATE 0.125 MG SL SUBL: SUBLINGUAL_TABLET | SUBLINGUAL | 0 refills | Status: DC

## 2020-10-24 NOTE — Assessment & Plan Note (Signed)
-  Compression socks -decrease sodium in diet -elevate feet Follow up with unresolved symptoms  Prescription for DME compression socks provided to patient.

## 2020-10-24 NOTE — Progress Notes (Signed)
Acute Office Visit  Subjective:    [name], female    DOB: 1955-12-03, 65 y.o.   MRN: [id number]  Chief Complaint  Patient presents with   Foot Swelling   Diarrhea    Diarrhea  This is a new problem. The current episode started in the past 7 days. The problem occurs less than 2 times per day. The problem has been unchanged. The stool consistency is described as Watery. The patient states that diarrhea does not awaken her from sleep. Pertinent negatives include no abdominal pain, chills, fever, headaches or weight loss. Nothing aggravates the symptoms. There are no known risk factors. She has tried nothing for the symptoms. The treatment provided no relief. There is no history of bowel resection.   Patient is in today for Edema: Patient complains of edema. The location of the edema is BLE feet.  The edema has been mild.  Onset of symptoms was a few weeks ago, unchanged since that time. The edema is present at bedtime. The patient states never.  The swelling has been aggravated by nothing, relieved by elevation of involved area, and been associated with nothing. Cardiac risk factors include advanced age (older than 40 for men, 56 for women), hypertension, and obesity (BMI >= 30 kg/m2).    Pain  She reports new onset bilateral flank  pain. was not an injury that may have caused the pain. The pain started a few days ago and is staying constant. The pain does not radiate . The pain is described as aching, is moderate in intensity, occurring constantly. Symptoms are worse in the: morning, mid-day, afternoon, evening  Aggravating factors: none Relieving factors: none.  She has tried acetaminophen with little relief.   ---------------------------------------------------------------------------------------------------   Past Medical History:  Diagnosis Date   Anemia    Anxiety    Chronic rhinitis    evaluated by dr y. Maudie Mercury ([facility]) note in epic 11-24-2019    Depression    DOE (dyspnea on exertion) 04-05-2020  per pt occasional gets sob sitting, lying, getting up and down, and with exertion   04-05-2020 pt pcp sent to cardiologist,  dr Stanford Breed for evalution, note in epic 03-28-2020 (ordered echo and cardiac CT and they have been scheduled   Endometrial polyp    First degree heart block    Headache    History of adenomatous polyp of colon    History of kidney stones    History of palpitations    previous had event monitor 2017 showed SR/ first degree heart block, no arrhythmia's and normal echo   History of seizure    ED visit 08-22-2014 in epic for possible seizure, per ed note adverse reaction to prednisone stimulant    Hyperlipidemia    Hypertension    followed by pcp   and cardiology (nuclear stress test 01-11- 2018 in epic, normal perfusion no ischemia with normal LV function and wall motion, nuclear ef 74%)   IBS (irritable bowel syndrome)    Nephrolithiasis    04-05-2020 per pt bilateral nonobstructive   OSA on CPAP    study in epic 08-31-1998 mild osa, recommended cpap   PMB (postmenopausal bleeding)    Refusal of blood transfusions as patient is Jehovah's Witness    SUI (stress urinary incontinence, female)     Past Surgical History:  Procedure Laterality Date   BREAST SURGERY  1974   lumpectomy, per pt benign   CYSTOSCOPY/RETROGRADE/URETEROSCOPY  02-01-2008  @[facility]   CYSTOSCOPY/URETEROSCOPY/HOLMIUM LASER/STENT PLACEMENT  2008;  11-07-2015 @[facility]    HYSTEROSCOPY WITH D & C N/A 04/09/2020   Procedure: DILATATION AND CURETTAGE /HYSTEROSCOPY;  Surgeon: Salvadore Dom, MD;  Location: [place];  Service: Gynecology;  Laterality: N/A;   LAPAROSCOPIC CHOLECYSTECTOMY  2014   LAPAROSCOPIC ILEOCECECTOMY  09/07/2012   @ [facility]   W/  APPENDECTOMY (done for large cecal polyp, non-malignant)   OVARIAN CYST SURGERY  1988   unsure which side   TONSILLECTOMY  05/1973   VENTRAL HERNIA REPAIR  07-31-2014   @[facility]     incisional  (open)    Family History  Problem Relation Age of Onset   Cancer Mother    Aneurysm Mother    Allergic rhinitis Mother    Breast cancer Mother 25   Atrial fibrillation Mother    Cancer Father    Hypertension Father    Alzheimer's disease Father    CAD Father    Allergic rhinitis Brother    COPD Maternal Aunt    Eczema Son    Bronchitis Son    Sinusitis Son    Asthma Neg Hx    Urticaria Neg Hx    Immunodeficiency Neg Hx    Atopy Neg Hx    Angioedema Neg Hx     Social History   Socioeconomic History   Marital status: Married    Spouse name: Not on file   Number of children: 1   Years of education: Not on file   Highest education level: Not on file  Occupational History   Not on file  Tobacco Use   Smoking status: Never   Smokeless tobacco: Never  Vaping Use   Vaping Use: Never used  Substance and Sexual Activity   Alcohol use: Not Currently    Comment: Rare   Drug use: Never   Sexual activity: Not on file  Other Topics Concern   Not on file  Social History Narrative   Not on file   Social Determinants of Health   Financial Resource Strain: Not on file  Food Insecurity: Not on file  Transportation Needs: Not on file  Physical Activity: Not on file  Stress: Not on file  Social Connections: Not on file  Intimate Partner Violence: Not on file    Outpatient Medications Prior to Visit  Medication Sig Dispense Refill   acetaminophen (TYLENOL) 325 MG tablet Take 650 mg by mouth every 6 (six) hours as needed for mild pain or moderate pain.      ALPRAZolam (XANAX) 0.5 MG tablet Take 0.5-1 tablets (0.25-0.5 mg total) by mouth daily as needed for anxiety. May take extra tablet during the day prn panic. 45 tablet 5   busPIRone (BUSPAR) 10 MG tablet Take 2 tablets (20 mg total) by mouth 2 (two) times daily. FOR ANXIETY 360 tablet 3   carvedilol (COREG) 6.25 MG tablet Take 1 tablet (6.25 mg total) by mouth 2 (two) times daily. 180 tablet 3   famotidine  (PEPCID) 20 MG tablet Take one tablet (20mg ) by mouth twice a day 180 tablet 3   losartan (COZAAR) 100 MG tablet Take 1 tablet (100 mg total) by mouth daily. (Patient taking differently: Take 100 mg by mouth daily.) 90 tablet 3   VIIBRYD 40 MG TABS Take 1 tablet (40 mg total) by mouth daily. 90 tablet 3   hyoscyamine (LEVSIN SL) 0.125 MG SL tablet TAKE 1 TABLET BY MOUTH EVERY 6 HOURS AS NEEDED 30 tablet 1   amLODipine (NORVASC) 10  MG tablet Take 1 tablet (10 mg total) by mouth daily. 90 tablet 3   rosuvastatin (CRESTOR) 20 MG tablet Take 1 tablet (20 mg total) by mouth daily. 90 tablet 3   No facility-administered medications prior to visit.    Allergies  Allergen Reactions   Ciprofloxacin Rash   Clindamycin Rash   Influenza Vaccines Rash   Latex Rash   Other     PER PT JEHOVAH WITNESS, BLOOD PRODUCT REFUSAL   Temazepam Rash   Enablex [Darifenacin Hydrobromide Er] Swelling    Causes swelling of patient's tongue   Hydroxyzine Hcl     Per pt was given prior to scheduled tonsillectomy in 1975 and had convulsions under anesthesia   Prednisolone Other (See Comments)    Convulsions per pt   Amoxicillin Diarrhea   Oxycodone Rash    percocet   Pantoprazole Rash   Pneumococcal Vaccine Swelling and Rash    Lymph node swelling   Sulfa Antibiotics Rash   Tape Rash    Bandages, etc.    Review of Systems  Constitutional:  Negative for chills, fever and weight loss.  HENT: Negative.    Eyes: Negative.   Respiratory: Negative.    Cardiovascular:  Positive for leg swelling.  Gastrointestinal:  Positive for diarrhea. Negative for abdominal pain.  Genitourinary:  Positive for flank pain.  Skin:  Negative for rash.  Neurological:  Negative for headaches.  Psychiatric/Behavioral: Negative.    All other systems reviewed and are negative.     Objective:    Physical Exam Vitals and nursing note reviewed.  Constitutional:      Appearance: Normal appearance.  HENT:     Head:  Normocephalic.     Right Ear: Ear canal normal.     Nose: Nose normal.     Mouth/Throat:     Mouth: Mucous membranes are moist.     Pharynx: Oropharynx is clear.  Eyes:     Conjunctiva/sclera: Conjunctivae normal.  Cardiovascular:     Rate and Rhythm: Normal rate and regular rhythm.     Pulses: Normal pulses.     Heart sounds: Normal heart sounds.     Comments: Trace edema BLE Pulmonary:     Effort: Pulmonary effort is normal.     Breath sounds: Normal breath sounds.  Abdominal:     General: Bowel sounds are normal.  Musculoskeletal:     Right lower leg: Edema present.     Left lower leg: Edema present.     Right foot: Swelling present.     Left foot: Swelling present.  Skin:    General: Skin is warm.     Findings: No rash.  Neurological:     Mental Status: She is alert and oriented to person, place, and time.  Psychiatric:        Mood and Affect: Mood normal.        Behavior: Behavior normal.    BP 125/72   Pulse 81   Temp 97.7 F (36.5 C) (Temporal)   Ht 5\' 5"  (1.651 m)   Wt 290 lb (131.5 kg)   SpO2 94%   BMI 48.26 kg/m  Wt Readings from Last 3 Encounters:  10/24/20 290 lb (131.5 kg)  07/31/20 285 lb 9.6 oz (129.5 kg)  07/17/20 281 lb 9.6 oz (127.7 kg)    Health Maintenance Due  Topic Date Due   COVID-19 Vaccine (5 - Booster for Pfizer series) 10/09/2020    There are no preventive care reminders to display for  this patient.   Lab Results  Component Value Date   TSH 1.190 11/07/2019   Lab Results  Component Value Date   WBC 6.6 11/07/2019   HGB 15.0 04/09/2020   HCT 44.0 04/09/2020   MCV 88 11/07/2019   PLT 275 11/07/2019   Lab Results  Component Value Date   NA 142 04/09/2020   K 4.1 04/09/2020   CO2 23 11/07/2019   GLUCOSE 111 (H) 04/09/2020   BUN 17 04/09/2020   CREATININE 1.00 04/09/2020   BILITOT 0.5 06/13/2020   ALKPHOS 63 06/13/2020   AST 35 06/13/2020   ALT 36 (H) 06/13/2020   PROT 7.0 06/13/2020   ALBUMIN 4.6 06/13/2020    CALCIUM 9.6 11/07/2019   ANIONGAP 10 08/22/2014   Lab Results  Component Value Date   CHOL 121 06/13/2020   Lab Results  Component Value Date   HDL 53 06/13/2020   Lab Results  Component Value Date   LDLCALC 48 06/13/2020   Lab Results  Component Value Date   TRIG 109 06/13/2020   Lab Results  Component Value Date   CHOLHDL 2.3 06/13/2020   Lab Results  Component Value Date   HGBA1C 5.3 11/07/2019       Assessment & Plan:   Problem List Items Addressed This Visit       Other   Flank pain - Primary    Flank pain/pelvic pain in the past few days.  Completed a urinalysis, patient recommendation-increase hydration, Tylenol/ibuprofen for pain.  We will treat based on lab results.  Printed handouts given.  Follow-up with worsening unresolved symptoms.      Relevant Orders   Urinalysis, Routine w reflex microscopic   Edema    -Compression socks -decrease sodium in diet -elevate feet Follow up with unresolved symptoms  Prescription for DME compression socks provided to patient.        Relevant Orders   For home use only DME Other see comment   Diarrhea    New onset diarrhea.  Imodium 4 mg tablet by mouth as needed.  Greasy, fried foods, reducing dairy, brat diet recommended.  Education provided to patient printed handouts given.  Rx sent to pharmacy.  Follow-up with unresolved symptoms.      Relevant Medications   loperamide (IMODIUM A-D) 2 MG tablet     Meds ordered this encounter  Medications   loperamide (IMODIUM A-D) 2 MG tablet    Sig: Take 1 tablet (2 mg total) by mouth 4 (four) times daily as needed for diarrhea or loose stools.    Dispense:  30 tablet    Refill:  0    Order Specific Question:   Supervising Provider    Answer:   [name] [[id number]]   hyoscyamine (LEVSIN SL) 0.125 MG SL tablet    Sig: TAKE 1 TABLET BY MOUTH EVERY 6 HOURS AS NEEDED    Dispense:  30 tablet    Refill:  0    Order Specific Question:   Supervising Provider     Answer:   [name] [[id number]]     Ivy Lynn, NP

## 2020-10-24 NOTE — Patient Instructions (Addendum)
Edema Edema is when you have too much fluid in your body or under your skin. Edema may make your legs, feet, and ankles swell up. Swelling is also common in looser tissues, like around your eyes. This is a common condition. It gets more common as you get older. There are many possible causes of edema. Eating too much salt (sodium) and being on your feet or sitting for a long time can cause edema in your legs, feet, and ankles. Hot weather may make edema worse. Edema is usually painless. Your skin may look swollen or shiny. Follow these instructions at home: Keep the swollen body part raised (elevated) above the level of your heart when you are sitting or lying down. Do not sit still or stand for a long time. Do not wear tight clothes. Do not wear garters on your upper legs. Exercise your legs. This can help the swelling go down. Wear elastic bandages or support stockings as told by your doctor. Eat a low-salt (low-sodium) diet to reduce fluid as told by your doctor. Depending on the cause of your swelling, you may need to limit how much fluid you drink (fluid restriction). Take over-the-counter and prescription medicines only as told by your doctor. Contact a doctor if: Treatment is not working. You have heart, liver, or kidney disease and have symptoms of edema. You have sudden and unexplained weight gain. Get help right away if: You have shortness of breath or chest pain. You cannot breathe when you lie down. You have pain, redness, or warmth in the swollen areas. You have heart, liver, or kidney disease and get edema all of a sudden. You have a fever and your symptoms get worse all of a sudden. Summary Edema is when you have too much fluid in your body or under your skin. Edema may make your legs, feet, and ankles swell up. Swelling is also common in looser tissues, like around your eyes. Raise (elevate) the swollen body part above the level of your heart when you are sitting or lying  down. Follow your doctor's instructions about diet and how much fluid you can drink (fluid restriction). This information is not intended to replace advice given to you by your health care provider. Make sure you discuss any questions you have with your health care provider. Document Revised: 10/26/2019 Document Reviewed: 10/26/2019 Elsevier Patient Education  2022 [place]. Flank Pain, Adult Flank pain is pain in your side. The flank is the area on your side between your upper belly (abdomen) and your spine. The pain may occur over a short time (acute), or it may be long-term or come back often (chronic). It may be mild or very bad. Pain in this area can be caused by many different things. Follow these instructions at home:  Drink enough fluid to keep your pee (urine) pale yellow. Rest as told by your doctor. Take over-the-counter and prescription medicines only as told by your doctor. Keep a journal to keep track of: What has caused your flank pain. What has made your flank pain feel better. Keep all follow-up visits. Contact a doctor if: Medicine does not help your pain. You have new symptoms. Your pain gets worse. Your symptoms last longer than 2-3 days. You have trouble peeing. You are peeing more often than normal. Get help right away if: You have trouble breathing. You are short of breath. Your belly hurts, or it is swollen or red. You feel like you may vomit (nauseous). You vomit. You feel  faint, or you faint. You have blood in your pee. You have flank pain and a fever. These symptoms may be an emergency. Get help right away. Call your local emergency services (911 in the [place] Do not wait to see if the symptoms will go away. Do not drive yourself to the hospital. Summary Flank pain is pain in your side. The flank is the area of your side between your upper belly (abdomen) and your spine. Flank pain may occur over a short time (acute), or it may be long-term or come  back often (chronic). It may be mild or very bad. Pain in this area can be caused by many different things. Contact your doctor if your symptoms get worse or last longer than 2-3 days. This information is not intended to replace advice given to you by your health care provider. Make sure you discuss any questions you have with your health care provider. Document Revised: 03/12/2020 Document Reviewed: 03/12/2020 Elsevier Patient Education  2022 [place]. Diarrhea, Adult Diarrhea is when you pass loose and watery poop (stool) often. Diarrhea can make you feel weak and cause you to lose water in your body (get dehydrated). Losing water in your body can cause you to: Feel tired and thirsty. Have a dry mouth. Go pee (urinate) less often. Diarrhea often lasts 2-3 days. However, it can last longer if it is a sign of something more serious. It is important to treat your diarrhea as told by your doctor. Follow these instructions at home: Eating and drinking   Follow these instructions as told by your doctor: Take an ORS (oral rehydration solution). This is a drink that helps you replace fluids and minerals your body lost. It is sold at pharmacies and stores. Drink plenty of fluids, such as: Water. Ice chips. Diluted fruit juice. Low-calorie sports drinks. Milk, if you want. Avoid drinking fluids that have a lot of sugar or caffeine in them. Eat bland, easy-to-digest foods in small amounts as you are able. These foods include: Bananas. Applesauce. Rice. Low-fat (lean) meats. Toast. Crackers. Avoid alcohol. Avoid spicy or fatty foods.  Medicines Take over-the-counter and prescription medicines only as told by your doctor. If you were prescribed an antibiotic medicine, take it as told by your doctor. Do not stop using the antibiotic even if you start to feel better. General instructions  Wash your hands often using soap and water. If soap and water are not available, use a hand  sanitizer. Others in your home should wash their hands as well. Hands should be washed: After using the toilet or changing a diaper. Before preparing, cooking, or serving food. While caring for a sick person. While visiting someone in a hospital. Drink enough fluid to keep your pee (urine) pale yellow. Rest at home while you get better. Watch your condition for any changes. Take a warm bath to help with any burning or pain from having diarrhea. Keep all follow-up visits as told by your doctor. This is important. Contact a doctor if: You have a fever. Your diarrhea gets worse. You have new symptoms. You cannot keep fluids down. You feel light-headed or dizzy. You have a headache. You have muscle cramps. Get help right away if: You have chest pain. You feel very weak or you pass out (faint). You have bloody or black poop or poop that looks like tar. You have very bad pain, cramping, or bloating in your belly (abdomen). You have trouble breathing or you are breathing very quickly. Your  heart is beating very quickly. Your skin feels cold and clammy. You feel confused. You have signs of losing too much water in your body, such as: Dark pee, very little pee, or no pee. Cracked lips. Dry mouth. Sunken eyes. Sleepiness. Weakness. Summary Diarrhea is when you pass loose and watery poop (stool) often. Diarrhea can make you feel weak and cause you to lose water in your body (get dehydrated). Take an ORS (oral rehydration solution). This is a drink that is sold at pharmacies and stores. Eat bland, easy-to-digest foods in small amounts as you are able. Contact a doctor if your condition gets worse. Get help right away if you have signs that you have lost too much water in your body. This information is not intended to replace advice given to you by your health care provider. Make sure you discuss any questions you have with your health care provider. Document Revised: 06/05/2017 Document  Reviewed: 06/05/2017 Elsevier Patient Education  [place].

## 2020-10-24 NOTE — Assessment & Plan Note (Signed)
New onset diarrhea.  Imodium 4 mg tablet by mouth as needed.  Greasy, fried foods, reducing dairy, brat diet recommended.  Education provided to patient printed handouts given.  Rx sent to pharmacy.  Follow-up with unresolved symptoms.

## 2020-10-24 NOTE — Assessment & Plan Note (Signed)
Flank pain/pelvic pain in the past few days.  Completed a urinalysis, patient recommendation-increase hydration, Tylenol/ibuprofen for pain.  We will treat based on lab results.  Printed handouts given.  Follow-up with worsening unresolved symptoms.

## 2020-10-25 ENCOUNTER — Other Ambulatory Visit: Payer: Self-pay | Admitting: Nurse Practitioner

## 2020-10-25 DIAGNOSIS — R109 Unspecified abdominal pain: Secondary | ICD-10-CM | POA: Diagnosis not present

## 2020-10-25 DIAGNOSIS — O26892 Other specified pregnancy related conditions, second trimester: Secondary | ICD-10-CM

## 2020-10-25 DIAGNOSIS — R300 Dysuria: Secondary | ICD-10-CM

## 2020-10-25 MED ORDER — FOSFOMYCIN TROMETHAMINE 3 G PO PACK: 3.0000 g | PACK | Freq: Once | ORAL | 0 refills | Status: AC

## 2020-10-25 NOTE — Addendum Note (Signed)
Addended by: Reather Converse on: 10/25/2020 04:47 PM   Modules accepted: Orders

## 2020-10-25 NOTE — Addendum Note (Signed)
Addended by: Antonietta Barcelona D on: 10/25/2020 04:46 PM   Modules accepted: Orders

## 2020-10-26 ENCOUNTER — Other Ambulatory Visit: Payer: Self-pay | Admitting: Nurse Practitioner

## 2020-10-26 DIAGNOSIS — R300 Dysuria: Secondary | ICD-10-CM

## 2020-10-26 MED ORDER — FOSFOMYCIN TROMETHAMINE 3 G PO PACK: 3.0000 g | PACK | Freq: Once | ORAL | 0 refills | Status: AC

## 2020-11-05 LAB — URINE CULTURE

## 2020-11-06 NOTE — Progress Notes (Signed)
Returning call.

## 2020-11-22 ENCOUNTER — Telehealth: Payer: Self-pay | Admitting: Cardiology

## 2020-11-22 NOTE — Telephone Encounter (Signed)
Returned call to patient-patient reports increased LE swelling on and off for the last month.  Reports swelling in in her feet and ankles, sometimes into her legs.   She saw her PCP and was told to wear compression stockings, has not tried this yet.   Reports feeling mild increase in SOB and feels like she is intermittently wheezing as well.  No distress, SOB, or wheezing noted at this time.  Reports swelling usually goes away overnight but comes back throughout the day.  Reports weights are stable, no increase in weight.     Patient also believes she is due for repeat lab work (rechecking cholesterol).   Per chart review: crestor started 04/2020, repeat recommended in 12 weeks.  Labs were completed 06/2020, stable, no changes recommended.  No labs due at this time.  Advised to elevate LE, try compression stockings, monitor sodium intake and will send message to Dr. Stanford Breed to review.

## 2020-11-22 NOTE — Telephone Encounter (Signed)
Pt c/o swelling: STAT is pt has developed SOB within 24 hours  How much weight have you gained and in what time span?  No significant weight gain  If swelling, where is the swelling located?  Feet and legs   Are you currently taking a fluid pill?  No   Are you currently SOB?  No   Do you have a log of your daily weights (if so, list)?  No log available  Have you gained 3 pounds in a day or 5 pounds in a week?  No   Have you traveled recently?  No   Patient is also requesting orders for lab work.

## 2020-12-14 NOTE — Telephone Encounter (Signed)
Meagan can you get this rx mailed to her?  thanks

## 2020-12-25 NOTE — Telephone Encounter (Signed)
I am delayed in seeing this message. Called [facility] and spoke to [facility] she stated that the patient is on a wait list for a cpap and it will likely be several months. States she has already talked to the patient last week. printed order and placed in mail out box in office.

## 2020-12-28 ENCOUNTER — Encounter: Payer: Self-pay | Admitting: Family Medicine

## 2020-12-28 ENCOUNTER — Ambulatory Visit (INDEPENDENT_AMBULATORY_CARE_PROVIDER_SITE_OTHER): Payer: BC Managed Care – PPO

## 2020-12-28 ENCOUNTER — Ambulatory Visit (INDEPENDENT_AMBULATORY_CARE_PROVIDER_SITE_OTHER): Payer: BC Managed Care – PPO | Admitting: Family Medicine

## 2020-12-28 VITALS — BP 112/69 | HR 75 | Temp 97.8°F | Ht 65.0 in | Wt 288.6 lb

## 2020-12-28 DIAGNOSIS — M545 Low back pain, unspecified: Secondary | ICD-10-CM

## 2020-12-28 DIAGNOSIS — M5136 Other intervertebral disc degeneration, lumbar region: Secondary | ICD-10-CM | POA: Diagnosis not present

## 2020-12-28 DIAGNOSIS — F411 Generalized anxiety disorder: Secondary | ICD-10-CM

## 2020-12-28 DIAGNOSIS — F5104 Psychophysiologic insomnia: Secondary | ICD-10-CM

## 2020-12-28 DIAGNOSIS — F339 Major depressive disorder, recurrent, unspecified: Secondary | ICD-10-CM | POA: Diagnosis not present

## 2020-12-28 DIAGNOSIS — Z79899 Other long term (current) drug therapy: Secondary | ICD-10-CM

## 2020-12-28 DIAGNOSIS — I10 Essential (primary) hypertension: Secondary | ICD-10-CM

## 2020-12-28 DIAGNOSIS — G8929 Other chronic pain: Secondary | ICD-10-CM

## 2020-12-28 MED ORDER — ALPRAZOLAM 0.5 MG PO TABS: 0.2500 mg | ORAL_TABLET | Freq: Every day | ORAL | 5 refills | Status: DC | PRN

## 2020-12-28 MED ORDER — LOSARTAN POTASSIUM 100 MG PO TABS
100.0000 mg | ORAL_TABLET | Freq: Every day | ORAL | 3 refills | Status: DC
Start: 2020-12-28 — End: 2021-01-02

## 2020-12-28 MED ORDER — SAXENDA 18 MG/3ML ~~LOC~~ SOPN: PEN_INJECTOR | SUBCUTANEOUS | 0 refills | Status: DC

## 2020-12-28 MED ORDER — INSULIN PEN NEEDLE 32G X 6 MM MISC: 3 refills | Status: DC

## 2020-12-28 MED ORDER — SAXENDA 18 MG/3ML ~~LOC~~ SOPN: 3.0000 mg | PEN_INJECTOR | Freq: Every day | SUBCUTANEOUS | 3 refills | Status: DC

## 2020-12-28 MED ORDER — BUSPIRONE HCL 10 MG PO TABS: 20.0000 mg | ORAL_TABLET | Freq: Two times a day (BID) | ORAL | 3 refills | Status: DC

## 2020-12-28 NOTE — Telephone Encounter (Signed)
No prob. I will resend the others.  The xanax was filled 12/2 and should be placed on hold until 01/13/21.  That was my error not postdating it

## 2020-12-28 NOTE — Progress Notes (Signed)
Subjective: CC:GAD PCP: Sherri Norlander, DO QRF:[name] is a 65 y.o. female presenting to clinic today for:  1. GAD Last fill of Xanax was 12/14/2020.  She takes this fairly regularly.  No reports of excessive daytime sedation, falls, respiratory pression, visual or auditory hallucinations.  Compliant with BuSpar and Viibryd.  2.  Back pain Patient reports several month history of right-sided lumbosacral/SI joint pain.  No preceding injury.  No reports of lower extremity numbness, tingling, weakness.  Pain not exacerbated by any certain movements.  She has been utilizing Tylenol but this has not been especially helpful in controlling discomfort.  3.  Morbid obesity Patient really would like to know if there is any type of medication she might go on for weight loss.  She struggles with physical activity and admits that this is likely a large because of her obesity.  No personal or family history of medullary thyroid cancers, multiple endocrine type II neoplasias or pancreatitis.  She does however have multiple drug allergies.  4.  Dizziness Patient reports that she gets a numbness/dizziness on the right side of her head when she is sleeping.  She is supposed to be on a CPAP machine and this is ordered but she has not been utilizing any type of CPAP currently.   ROS: Per HPI  Allergies  Allergen Reactions   Ciprofloxacin Rash   Clindamycin Rash   Influenza Vaccines Rash   Latex Rash   Other     PER PT JEHOVAH WITNESS, BLOOD PRODUCT REFUSAL   Temazepam Rash   Enablex [Darifenacin Hydrobromide Er] Swelling    Causes swelling of patient's tongue   Hydroxyzine Hcl     Per pt was given prior to scheduled tonsillectomy in 1975 and had convulsions under anesthesia   Prednisolone Other (See Comments)    Convulsions per pt   Amoxicillin Diarrhea   Oxycodone Rash    percocet   Pantoprazole Rash   Pneumococcal Vaccine Swelling and Rash    Lymph node swelling   Sulfa  Antibiotics Rash   Tape Rash    Bandages, etc.   Past Medical History:  Diagnosis Date   Anemia    Anxiety    Chronic rhinitis    evaluated by dr y. Maudie [facility] ([facility] note in epic 11-24-2019   Depression    DOE (dyspnea on exertion) 04-05-2020  per pt occasional gets sob sitting, lying, getting up and down, and with exertion   04-05-2020 pt pcp sent to cardiologist,  dr Stanford Breed for evalution, note in epic 03-28-2020 (ordered echo and cardiac CT and they have been scheduled   Endometrial polyp    First degree heart block    Headache    History of adenomatous polyp of colon    History of kidney stones    History of palpitations    previous had event monitor 2017 showed SR/ first degree heart block, no arrhythmia's and normal echo   History of seizure    ED visit 08-22-2014 in epic for possible seizure, per ed note adverse reaction to prednisone stimulant    Hyperlipidemia    Hypertension    followed by pcp   and cardiology (nuclear stress test 01-11- 2018 in epic, normal perfusion no ischemia with normal LV function and wall motion, nuclear ef 74%)   IBS (irritable bowel syndrome)    Nephrolithiasis    04-05-2020 per pt bilateral nonobstructive   OSA on CPAP    study in epic 08-31-1998  mild osa, recommended cpap   PMB (postmenopausal bleeding)    Refusal of blood transfusions as patient is Jehovah's Witness    SUI (stress urinary incontinence, female)     Current Outpatient Medications:    acetaminophen (TYLENOL) 325 MG tablet, Take 650 mg by mouth every 6 (six) hours as needed for mild pain or moderate pain. , Disp: , Rfl:    ALPRAZolam (XANAX) 0.5 MG tablet, Take 0.5-1 tablets (0.25-0.5 mg total) by mouth daily as needed for anxiety. May take extra tablet during the day prn panic., Disp: 45 tablet, Rfl: 5   amLODipine (NORVASC) 10 MG tablet, Take 1 tablet (10 mg total) by mouth daily., Disp: 90 tablet, Rfl: 3   busPIRone (BUSPAR) 10 MG tablet, Take 2 tablets (20 mg  total) by mouth 2 (two) times daily. FOR ANXIETY, Disp: 360 tablet, Rfl: 3   carvedilol (COREG) 6.25 MG tablet, Take 1 tablet (6.25 mg total) by mouth 2 (two) times daily., Disp: 180 tablet, Rfl: 3   famotidine (PEPCID) 20 MG tablet, Take one tablet (20mg ) by mouth twice a day, Disp: 180 tablet, Rfl: 3   hyoscyamine (LEVSIN SL) 0.125 MG SL tablet, TAKE 1 TABLET BY MOUTH EVERY 6 HOURS AS NEEDED, Disp: 30 tablet, Rfl: 0   loperamide (IMODIUM A-D) 2 MG tablet, Take 1 tablet (2 mg total) by mouth 4 (four) times daily as needed for diarrhea or loose stools., Disp: 30 tablet, Rfl: 0   losartan (COZAAR) 100 MG tablet, Take 1 tablet (100 mg total) by mouth daily. (Patient taking differently: Take 100 mg by mouth daily.), Disp: 90 tablet, Rfl: 3   rosuvastatin (CRESTOR) 20 MG tablet, Take 1 tablet (20 mg total) by mouth daily., Disp: 90 tablet, Rfl: 3   VIIBRYD 40 MG TABS, Take 1 tablet (40 mg total) by mouth daily., Disp: 90 tablet, Rfl: 3 Social History   Socioeconomic History   Marital status: Married    Spouse name: Not on file   Number of children: 1   Years of education: Not on file   Highest education level: Not on file  Occupational History   Not on file  Tobacco Use   Smoking status: Never   Smokeless tobacco: Never  Vaping Use   Vaping Use: Never used  Substance and Sexual Activity   Alcohol use: Not Currently    Comment: Rare   Drug use: Never   Sexual activity: Not on file  Other Topics Concern   Not on file  Social History Narrative   Not on file   Social Determinants of Health   Financial Resource Strain: Not on file  Food Insecurity: Not on file  Transportation Needs: Not on file  Physical Activity: Not on file  Stress: Not on file  Social Connections: Not on file  Intimate Partner Violence: Not on file   Family History  Problem Relation Age of Onset   Cancer Mother    Aneurysm Mother    Allergic rhinitis Mother    Breast cancer Mother 95   Atrial fibrillation  Mother    Cancer Father    Hypertension Father    Alzheimer's disease Father    CAD Father    Allergic rhinitis Brother    COPD Maternal Aunt    Eczema Son    Bronchitis Son    Sinusitis Son    Asthma Neg Hx    Urticaria Neg Hx    Immunodeficiency Neg Hx    Atopy Neg Hx  Angioedema Neg Hx     Objective: Office vital signs reviewed. BP 112/69    Pulse 75    Temp 97.8 F (36.6 C)    Ht 5\' 5"  (1.651 m)    Wt 288 lb 9.6 oz (130.9 kg)    SpO2 95%    BMI 48.03 kg/m   Physical Examination:  General: Awake, alert, well nourished, No acute distress HEENT: Normal; sclera white.  Moist mucous membranes Cardio: regular rate and rhythm, S1S2 heard, no murmurs appreciated Pulm: clear to auscultation bilaterally, no wheezes, rhonchi or rales; normal work of breathing on room air GI: Obese Extremities: warm, well perfused, No edema, cyanosis or clubbing; +2 pulses bilaterally MSK: Slightly antalgic gait  Lumbar spine: No midline tenderness palpation.  She does have mild tenderness palpation on the right lumbosacral area and just over the right SI joint.  Minimal tenderness palpation into the right gluteus. Strength and light touch sensation grossly intact Psych: Mood is stable  Depression screen [facility] 2/9 12/28/2020 10/24/2020 07/31/2020 03/14/2020 03/13/2020  Decreased Interest 2 1 2 1 2   Down, Depressed, Hopeless 2 2 2 1 2   PHQ - 2 Score 4 3 4 2 4   Altered sleeping 1 3 3 1 2   Tired, decreased energy 3 2 3 2 2   Change in appetite 2 1 2  0 1  Feeling bad or failure about yourself  2 2 2 2 2   Trouble concentrating 2 1 2 2 1   Moving slowly or fidgety/restless 1 1 0 0 1  Suicidal thoughts 1 1 1  0 1  PHQ-9 Score 16 14 17 9 14   Difficult doing work/chores Somewhat difficult - - - Not difficult at all  Some recent data might be hidden   GAD 7 : Generalized Anxiety Score 12/28/2020 07/31/2020 03/14/2020 03/13/2020  Nervous, Anxious, on Edge 2 1 2 2   Control/stop worrying 2 1 1 1   Worry too much -  different things 2 1 2 1   Trouble relaxing 1 1 1 1   Restless 1 1 1 2   Easily annoyed or irritable 1 0 0 1  Afraid - awful might happen 1 1 0 0  Total GAD 7 Score 10 6 7 8   Anxiety Difficulty Somewhat difficult - Somewhat difficult Somewhat difficult   No results found.   Assessment/ Plan: 65 y.o. female   Generalized anxiety disorder - Plan: ALPRAZolam (XANAX) 0.5 MG tablet, busPIRone (BUSPAR) 10 MG tablet  Depression, recurrent (HCC) - Plan: Liraglutide -Weight Management (SAXENDA) 18 MG/3ML SOPN, Liraglutide -Weight Management (SAXENDA) 18 MG/3ML SOPN, Insulin Pen Needle 32G X 6 MM MISC  Psychophysiological insomnia - Plan: ALPRAZolam (XANAX) 0.5 MG tablet  Chronically on benzodiazepine therapy  Essential hypertension - Plan: losartan (COZAAR) 100 MG tablet, Liraglutide -Weight Management (SAXENDA) 18 MG/3ML SOPN, Liraglutide -Weight Management (SAXENDA) 18 MG/3ML SOPN, Insulin Pen Needle 32G X 6 MM MISC  Morbid obesity ([place]) - Plan: Liraglutide -Weight Management (SAXENDA) 18 MG/3ML SOPN, Liraglutide -Weight Management (SAXENDA) 18 MG/3ML SOPN, Insulin Pen Needle 32G X 6 MM MISC  Chronic right-sided low back pain without sciatica - Plan: Liraglutide -Weight Management (SAXENDA) 18 MG/3ML SOPN, Insulin Pen Needle 32G X 6 MM MISC, DG Lumbar Spine 2-3 Views  Alprazolam renewed.  Continue buspirone, may need to consider advancing to higher dose given high PHQ and GAD-7 scores.  However much the patient's focus today was on her weight, back pain.  Blood pressure was at goal.  No changes  Given her morbid obesity we talked about  Saxenda treatment.  Probably will require preauthorization but I gave her a sample to start her on.  We did the first injection today in office.  Precautions discussed.  Encourage p.o. hydration.  Would like to see her back in the next 3 months for weight recheck, sooner if concerns arise  For her back pain, I suspect osteoarthritis and after personal review of  x-rays she did show degenerative changes on multiple levels of the lumbar spine.  Could consider referral but I think that weight loss would most certainly improve her symptoms.   No orders of the defined types were placed in this encounter.  No orders of the defined types were placed in this encounter.    Sherri Norlander, DO [place] [phone]

## 2020-12-30 ENCOUNTER — Encounter: Payer: Self-pay | Admitting: Family Medicine

## 2020-12-31 ENCOUNTER — Ambulatory Visit: Payer: BC Managed Care – PPO | Admitting: Family Medicine

## 2021-01-01 ENCOUNTER — Encounter: Payer: Self-pay | Admitting: Family Medicine

## 2021-01-01 ENCOUNTER — Other Ambulatory Visit: Payer: Self-pay | Admitting: Family Medicine

## 2021-01-01 DIAGNOSIS — I10 Essential (primary) hypertension: Secondary | ICD-10-CM

## 2021-01-07 ENCOUNTER — Encounter: Payer: Self-pay | Admitting: Family Medicine

## 2021-01-11 ENCOUNTER — Encounter: Payer: Self-pay | Admitting: Family Medicine

## 2021-01-21 ENCOUNTER — Telehealth: Payer: Self-pay | Admitting: Pharmacist

## 2021-01-21 ENCOUNTER — Encounter: Payer: Self-pay | Admitting: Family Medicine

## 2021-01-21 NOTE — Telephone Encounter (Signed)
Please call patient and pharmacy  Saxenda approved for 6 months  Schedule 6 month follow up with PCP per insurance--patient must 4% of body weight in 6 months, documented to continue   BMI 48 Saxenda PA submitted

## 2021-01-22 NOTE — Telephone Encounter (Signed)
Pharmacy is aware PA approved. TTC pt but no answer, left message on both numbers in contacts.

## 2021-01-22 NOTE — Telephone Encounter (Signed)
Pt called back and is aware Saxenda approved and to keep her follow up appt 03/2021 with Dr Darnell Level.

## 2021-02-07 ENCOUNTER — Telehealth: Payer: Self-pay | Admitting: Family Medicine

## 2021-02-07 ENCOUNTER — Telehealth: Payer: Self-pay

## 2021-02-07 NOTE — Telephone Encounter (Signed)
She should be okay. IF she experiences symptoms of low blood sugar she should go to the E.D.

## 2021-02-07 NOTE — Telephone Encounter (Signed)
[place] covering provider... Patient is on Saxenda, she is supposed to take 3 mg weekly.  This morning she accidentally took 6 mg, 3 mg out of each pen, because she thought she was supposed to go up to 6 mg this week.  Should she be concerned, what should she do, if anything?

## 2021-02-07 NOTE — Telephone Encounter (Signed)
Patient aware.

## 2021-02-13 ENCOUNTER — Other Ambulatory Visit: Payer: Self-pay | Admitting: *Deleted

## 2021-02-13 DIAGNOSIS — I10 Essential (primary) hypertension: Secondary | ICD-10-CM

## 2021-02-13 DIAGNOSIS — F339 Major depressive disorder, recurrent, unspecified: Secondary | ICD-10-CM

## 2021-02-13 DIAGNOSIS — M545 Low back pain, unspecified: Secondary | ICD-10-CM

## 2021-02-13 MED ORDER — SAXENDA 18 MG/3ML ~~LOC~~ SOPN: 3.0000 mg | PEN_INJECTOR | Freq: Every day | SUBCUTANEOUS | 3 refills | Status: DC

## 2021-02-13 NOTE — Telephone Encounter (Signed)
TC to pt, she is wanting to change to mail order

## 2021-02-22 ENCOUNTER — Ambulatory Visit: Payer: BC Managed Care – PPO | Admitting: Pulmonary Disease

## 2021-02-24 ENCOUNTER — Other Ambulatory Visit: Payer: Self-pay | Admitting: Nurse Practitioner

## 2021-04-01 ENCOUNTER — Encounter: Payer: Self-pay | Admitting: Family Medicine

## 2021-04-01 ENCOUNTER — Ambulatory Visit (INDEPENDENT_AMBULATORY_CARE_PROVIDER_SITE_OTHER): Payer: BC Managed Care – PPO | Admitting: Family Medicine

## 2021-04-01 DIAGNOSIS — I10 Essential (primary) hypertension: Secondary | ICD-10-CM

## 2021-04-01 NOTE — Progress Notes (Signed)
? ?Subjective: ?CC: Follow-up morbid obesity ?PCP: [name], DO ?Sherri Howard is a 66 y.o. female presenting to clinic today for: ? ?1.  Morbid obesity ?Patient was started on Saxenda in December.  She is up to 3 mg subcu daily.  She has been responding really well to this and has had some intermittent diarrhea that is more than her baseline but otherwise seems to be tolerating the medication without difficulty.  She is had a couple of instances where she felt lightheaded.  She admits that she is not hydrating well enough though.  Would like to have her kidney function and electrolytes checked today.  Additionally, she notes that her insurance will be changing soon and she is worried that this will fall off of coverage.  She wonders what she might do to maintain ? ? ?ROS: Per HPI ? ?Allergies  ?Allergen Reactions  ? Ciprofloxacin Rash  ? Clindamycin Rash  ? Influenza Vaccines Rash  ? Latex Rash  ? Other   ?  PER PT JEHOVAH WITNESS, BLOOD PRODUCT REFUSAL  ? Temazepam Rash  ? Enablex [Darifenacin Hydrobromide Er] Swelling  ?  Causes swelling of patient's tongue  ? Hydroxyzine Hcl   ?  Per pt was given prior to scheduled tonsillectomy in 1975 and had convulsions under anesthesia  ? Prednisolone Other (See Comments)  ?  Convulsions per pt  ? Amoxicillin Diarrhea  ? Oxycodone Rash  ?  percocet  ? Pantoprazole Rash  ? Pneumococcal Vaccine Swelling and Rash  ?  Lymph node swelling  ? Sulfa Antibiotics Rash  ? Tape Rash  ?  Bandages, etc.  ? ?Past Medical History:  ?Diagnosis Date  ? Anemia   ? Anxiety   ? Chronic rhinitis   ? evaluated by dr y. Maudie Mercury ([facility]) note in epic 11-24-2019  ? Depression   ? DOE (dyspnea on exertion) 04-05-2020  per pt occasional gets sob sitting, lying, getting up and down, and with exertion  ? 04-05-2020 pt pcp sent to cardiologist,  dr Stanford Breed for evalution, note in epic 03-28-2020 (ordered echo and cardiac CT and they have been scheduled  ? Endometrial polyp    ? First degree heart block   ? Headache   ? History of adenomatous polyp of colon   ? History of kidney stones   ? History of palpitations   ? previous had event monitor 2017 showed SR/ first degree heart block, no arrhythmia's and normal echo  ? History of seizure   ? ED visit 08-22-2014 in epic for possible seizure, per ed note adverse reaction to prednisone stimulant   ? Hyperlipidemia   ? Hypertension   ? followed by pcp   and cardiology (nuclear stress test 01-11- 2018 in epic, normal perfusion no ischemia with normal LV function and wall motion, nuclear ef 74%)  ? IBS (irritable bowel syndrome)   ? Nephrolithiasis   ? 04-05-2020 per pt bilateral nonobstructive  ? OSA on CPAP   ? study in epic 08-31-1998 mild osa, recommended cpap  ? PMB (postmenopausal bleeding)   ? Refusal of blood transfusions as patient is Jehovah's Witness   ? SUI (stress urinary incontinence, female)   ? ? ?Current Outpatient Medications:  ?  acetaminophen (TYLENOL) 325 MG tablet, Take 650 mg by mouth every 6 (six) hours as needed for mild pain or moderate pain. , Disp: , Rfl:  ?  ALPRAZolam (XANAX) 0.5 MG tablet, Take 0.5-1 tablets (0.25-0.5 mg total) by mouth  daily as needed for anxiety. May take extra tablet during the day prn panic., Disp: 45 tablet, Rfl: 5 ?  amLODipine (NORVASC) 10 MG tablet, Take 1 tablet (10 mg total) by mouth daily., Disp: 90 tablet, Rfl: 3 ?  busPIRone (BUSPAR) 10 MG tablet, Take 2 tablets (20 mg total) by mouth 2 (two) times daily. FOR ANXIETY, Disp: 360 tablet, Rfl: 3 ?  carvedilol (COREG) 6.25 MG tablet, Take 1 tablet (6.25 mg total) by mouth 2 (two) times daily., Disp: 180 tablet, Rfl: 3 ?  famotidine (PEPCID) 20 MG tablet, Take one tablet (23m) by mouth twice a day, Disp: 180 tablet, Rfl: 3 ?  hyoscyamine (LEVSIN SL) 0.125 MG SL tablet, DISSOLVE 1 TABLET UNDER THE TONGUE EVERY 6 HOURS AS NEEDED, Disp: 30 tablet, Rfl: 0 ?  Insulin Pen Needle 32G X 6 MM MISC, UAD with saxenda, Disp: 100 each, Rfl: 3 ?   Liraglutide -Weight Management (SAXENDA) 18 MG/3ML SOPN, Inject 3 mg into the skin daily., Disp: 45 mL, Rfl: 3 ?  loperamide (IMODIUM A-D) 2 MG tablet, Take 1 tablet (2 mg total) by mouth 4 (four) times daily as needed for diarrhea or loose stools., Disp: 30 tablet, Rfl: 0 ?  losartan (COZAAR) 100 MG tablet, TAKE 1 TABLET DAILY, Disp: 90 tablet, Rfl: 3 ?  rosuvastatin (CRESTOR) 20 MG tablet, Take 1 tablet (20 mg total) by mouth daily., Disp: 90 tablet, Rfl: 3 ?  VIIBRYD 40 MG TABS, Take 1 tablet (40 mg total) by mouth daily., Disp: 90 tablet, Rfl: 3 ?Social History  ? ?Socioeconomic History  ? Marital status: Married  ?  Spouse name: Not on file  ? Number of children: 1  ? Years of education: Not on file  ? Highest education level: Not on file  ?Occupational History  ? Not on file  ?Tobacco Use  ? Smoking status: Never  ? Smokeless tobacco: Never  ?Vaping Use  ? Vaping Use: Never used  ?Substance and Sexual Activity  ? Alcohol use: Not Currently  ?  Comment: Rare  ? Drug use: Never  ? Sexual activity: Not on file  ?Other Topics Concern  ? Not on file  ?Social History Narrative  ? Not on file  ? ?Social Determinants of Health  ? ?Financial Resource Strain: Not on file  ?Food Insecurity: Not on file  ?Transportation Needs: Not on file  ?Physical Activity: Not on file  ?Stress: Not on file  ?Social Connections: Not on file  ?Intimate Partner Violence: Not on file  ? ?Family History  ?Problem Relation Age of Onset  ? Cancer Mother   ? Aneurysm Mother   ? Allergic rhinitis Mother   ? Breast cancer Mother [phone] ? Atrial fibrillation Mother   ? Cancer Father   ? Hypertension Father   ? Alzheimer's disease Father   ? CAD Father   ? Allergic rhinitis Brother   ? COPD Maternal Aunt   ? Eczema Son   ? Bronchitis Son   ? Sinusitis Son   ? Asthma Neg Hx   ? Urticaria Neg Hx   ? Immunodeficiency Neg Hx   ? Atopy Neg Hx   ? Angioedema Neg Hx   ? ? ?Objective: ?Office vital signs reviewed. ?BP 127/71   Pulse 99   Temp 97.8 ?F (36.6  ?C)   Resp 20   Ht 5' 5"  (1.651 m)   Wt 266 lb (120.7 kg)   SpO2 95%   BMI 44.26 kg/m?  ? ?Physical  Examination:  ?General: Awake, alert, obese, No acute distress ?HEENT: face puffy, sclera white ?Cardio: regular rate and rhythm, S1S2 heard, no murmurs appreciated ?Pulm: clear to auscultation bilaterally, no wheezes, rhonchi or rales; normal work of breathing on room air ?Psych: Mood stable, speech normal ? ?Assessment/ Plan: ?66 y.o. female  ? ?Morbid obesity ([place]) - Plan: CMP14+EGFR ? ?Essential hypertension ? ?She is responding wonderfully to the [place].  She is down over 22 pounds since her visit back in December.  She had some intermittent GI intolerance that she described as diarrhea during her visit today.  We discussed ways to avoid/not exacerbate these types of GI symptoms including carb restriction, limiting caffeine intake.  Encourage p.o. hydration with water.  I would like her to monitor her blood pressures closely as she continues to lose weight so as to avoid any hypotension that may be induced from ongoing treatment for hypertension. ? ?No orders of the defined types were placed in this encounter. ? ?No orders of the defined types were placed in this encounter. ? ? ? ?[name], DO ?[facility] ?[phone] ? ?

## 2021-04-02 LAB — CMP14+EGFR
ALT: 33 IU/L — ABNORMAL HIGH (ref 0–32)
AST: 32 IU/L (ref 0–40)
Albumin/Globulin Ratio: 1.8 (ref 1.2–2.2)
Albumin: 4.3 g/dL (ref 3.8–4.8)
Alkaline Phosphatase: 73 IU/L (ref 44–121)
BUN/Creatinine Ratio: 11 — ABNORMAL LOW (ref 12–28)
BUN: 13 mg/dL (ref 8–27)
Bilirubin Total: 0.5 mg/dL (ref 0.0–1.2)
CO2: 22 mmol/L (ref 20–29)
Calcium: 9.9 mg/dL (ref 8.7–10.3)
Chloride: 103 mmol/L (ref 96–106)
Creatinine, Ser: 1.15 mg/dL — ABNORMAL HIGH (ref 0.57–1.00)
Globulin, Total: 2.4 g/dL (ref 1.5–4.5)
Glucose: 105 mg/dL — ABNORMAL HIGH (ref 70–99)
Potassium: 4.4 mmol/L (ref 3.5–5.2)
Sodium: 139 mmol/L (ref 134–144)
Total Protein: 6.7 g/dL (ref 6.0–8.5)
eGFR: 54 mL/min/{1.73_m2} — ABNORMAL LOW (ref >59)

## 2021-04-03 ENCOUNTER — Encounter: Payer: Self-pay | Admitting: Family Medicine

## 2021-04-03 DIAGNOSIS — N1831 Chronic kidney disease, stage 3a: Secondary | ICD-10-CM

## 2021-04-19 ENCOUNTER — Encounter: Payer: Self-pay | Admitting: Family Medicine

## 2021-04-22 ENCOUNTER — Telehealth: Payer: Self-pay

## 2021-04-22 MED ORDER — HYOSCYAMINE SULFATE 0.125 MG SL SUBL: SUBLINGUAL_TABLET | SUBLINGUAL | 0 refills | Status: DC

## 2021-04-22 NOTE — Telephone Encounter (Signed)
Andrya Couse (Key: [id number]) ?Saxenda '18MG'$ /3ML pen-injectors ?  ?Form ?[employer] Electronic PA Form [phone] NCPDP) ?Created ?9 hours ago ?Sent to Plan ?3 minutes ago ?Plan Response ?3 minutes ago ?Submit Clinical Questions ?2 minutes ago ?Determination ?Favorable ?2 minutes ago ?Your prior authorization for [employer] has been approved! ?MORE INFO ?For eligible patients, copay assistance may be available. To learn more and be redirected to the [facility]? website, click on the "More Info" button to the right. Please also note that you may need to schedule a follow-up visit with your patient prior to the expiration of this prior authorization, as updated patient weight may be required for reauthorization. ? ?Message from plan: [id number];EQFDVO:[id number];Review Type:Prior Auth;Coverage Start Date:03/23/2021;Coverage End Date:08/20/2021; ? ?? Prescriber Instructions ?

## 2021-04-22 NOTE — Telephone Encounter (Signed)
Sherri Howard (Key: [id number]) ?Saxenda '18MG'$ /3ML pen-injectors ?  ?Form ?[employer] Electronic PA Form [phone] NCPDP) ?Created ?9 hours ago ?Sent to Plan ?1 minute ago ?Plan Response ?1 minute ago ?Submit Clinical Questions ?less than a minute ago ?Determination ?Wait for Determination ?Please wait for [employer] 2017 to return a determination. ?

## 2021-04-23 ENCOUNTER — Encounter: Payer: Self-pay | Admitting: Family Medicine

## 2021-04-23 DIAGNOSIS — I10 Essential (primary) hypertension: Secondary | ICD-10-CM

## 2021-04-23 DIAGNOSIS — F339 Major depressive disorder, recurrent, unspecified: Secondary | ICD-10-CM

## 2021-04-23 DIAGNOSIS — M545 Low back pain, unspecified: Secondary | ICD-10-CM

## 2021-04-23 MED ORDER — INSULIN PEN NEEDLE 32G X 6 MM MISC: 3 refills | Status: DC

## 2021-04-26 ENCOUNTER — Ambulatory Visit: Payer: BC Managed Care – PPO | Admitting: Pulmonary Disease

## 2021-04-29 ENCOUNTER — Other Ambulatory Visit: Payer: Self-pay | Admitting: Cardiology

## 2021-04-30 ENCOUNTER — Encounter: Payer: Self-pay | Admitting: Family Medicine

## 2021-04-30 ENCOUNTER — Ambulatory Visit (INDEPENDENT_AMBULATORY_CARE_PROVIDER_SITE_OTHER): Payer: BC Managed Care – PPO | Admitting: Family Medicine

## 2021-04-30 VITALS — BP 107/67 | HR 82 | Temp 97.5°F | Ht 65.0 in | Wt 260.2 lb

## 2021-04-30 DIAGNOSIS — M79651 Pain in right thigh: Secondary | ICD-10-CM

## 2021-04-30 DIAGNOSIS — M25551 Pain in right hip: Secondary | ICD-10-CM

## 2021-04-30 DIAGNOSIS — M549 Dorsalgia, unspecified: Secondary | ICD-10-CM

## 2021-04-30 MED ORDER — METHOCARBAMOL 500 MG PO TABS
500.0000 mg | ORAL_TABLET | Freq: Three times a day (TID) | ORAL | 0 refills | Status: DC | PRN
Start: 2021-04-30 — End: 2022-02-03

## 2021-04-30 NOTE — Progress Notes (Signed)
? ?Assessment & Plan:  ?1. Musculoskeletal back pain ?Education provided on musculoskeletal pain.  Recommended NSAID and muscle relaxer as needed.  Started Robaxin. ?- methocarbamol (ROBAXIN) 500 MG tablet; Take 1 tablet (500 mg total) by mouth every 8 (eight) hours as needed for muscle spasms.  Dispense: 60 tablet; Refill: 0 ? ?2-3. Right hip pain/Musculoskeletal pain of right thigh ?Education provided on musculoskeletal pain.  Recommended NSAID and muscle relaxer as needed.  Started Robaxin.  Referring to physical therapy for evaluation, treatment, and possible dry needling. ?- Ambulatory referral to Physical Therapy ?- methocarbamol (ROBAXIN) 500 MG tablet; Take 1 tablet (500 mg total) by mouth every 8 (eight) hours as needed for muscle spasms.  Dispense: 60 tablet; Refill: 0 ? ? ?Follow up plan: Return if symptoms worsen or fail to improve. ? ?Hendricks Limes, MSN, APRN, FNP-C ?[place] ? ?Subjective:  ? ?[name], female    DOB: 1955-08-31, 66 y.o.   MRN: [id number] ? ?HPI: ?Sherri Howard is a 66 y.o. female presenting on 04/30/2021 for Back Pain (Patient states she has been having middle back pain since Saturday but got worse on Sunday.) and Leg Pain (Right leg pain and numbness that has been going on a few months and has started having pain daily. ) ? ?Patient reports pain across the middle of her back that started three days ago.  Denies any unusual activity, but states when the pain first started she was bending over and felt it upon standing back up.  That afternoon the pain had subsided so she decided to go to a friend's house to visit.  States when she was there she bent over to pet the dog and it happened again.  States the pain was so bad it took her breath away; she rates the pain 8/10 when present.  The pain has resolved as of now. ? ?Patient also reports intermittent right leg pain since having hernia surgery years ago.  Her concern is that the pain is becoming  more frequent and is occurring almost daily.  She describes the pain as a really strong and jabbing; she rates the pain 6/10.  Pain worsens if she tries to lay on her right side. ? ? ?ROS: Negative unless specifically indicated above in HPI.  ? ?Relevant past medical history reviewed and updated as indicated.  ? ?Allergies and medications reviewed and updated. ? ? ?Current Outpatient Medications:  ?  acetaminophen (TYLENOL) 325 MG tablet, Take 650 mg by mouth every 6 (six) hours as needed for mild pain or moderate pain. , Disp: , Rfl:  ?  ALPRAZolam (XANAX) 0.5 MG tablet, Take 0.5-1 tablets (0.25-0.5 mg total) by mouth daily as needed for anxiety. May take extra tablet during the day prn panic., Disp: 45 tablet, Rfl: 5 ?  busPIRone (BUSPAR) 10 MG tablet, Take 2 tablets (20 mg total) by mouth 2 (two) times daily. FOR ANXIETY, Disp: 360 tablet, Rfl: 3 ?  carvedilol (COREG) 6.25 MG tablet, Take 1 tablet (6.25 mg total) by mouth 2 (two) times daily., Disp: 180 tablet, Rfl: 3 ?  famotidine (PEPCID) 20 MG tablet, Take one tablet ('20mg'$ ) by mouth twice a day, Disp: 180 tablet, Rfl: 3 ?  hyoscyamine (LEVSIN SL) 0.125 MG SL tablet, DISSOLVE 1 TABLET UNDER THE TONGUE EVERY 6 HOURS AS NEEDED, Disp: 30 tablet, Rfl: 0 ?  Insulin Pen Needle 32G X 6 MM MISC, UAD with saxenda, Disp: 100 each, Rfl: 3 ?  Liraglutide -Weight  Management (SAXENDA) 18 MG/3ML SOPN, Inject 3 mg into the skin daily., Disp: 45 mL, Rfl: 3 ?  losartan (COZAAR) 100 MG tablet, TAKE 1 TABLET DAILY, Disp: 90 tablet, Rfl: 3 ?  VIIBRYD 40 MG TABS, Take 1 tablet (40 mg total) by mouth daily., Disp: 90 tablet, Rfl: 3 ?  amLODipine (NORVASC) 10 MG tablet, Take 1 tablet (10 mg total) by mouth daily., Disp: 90 tablet, Rfl: 3 ?  loperamide (IMODIUM A-D) 2 MG tablet, Take 1 tablet (2 mg total) by mouth 4 (four) times daily as needed for diarrhea or loose stools. (Patient not taking: Reported on 04/30/2021), Disp: 30 tablet, Rfl: 0 ?  rosuvastatin (CRESTOR) 20 MG tablet, Take  1 tablet (20 mg total) by mouth daily., Disp: 90 tablet, Rfl: 3 ? ?Allergies  ?Allergen Reactions  ? Ciprofloxacin Rash  ? Clindamycin Rash  ? Influenza Vaccines Rash  ? Latex Rash  ? Other   ?  PER PT JEHOVAH WITNESS, BLOOD PRODUCT REFUSAL  ? Temazepam Rash  ? Enablex [Darifenacin Hydrobromide Er] Swelling  ?  Causes swelling of patient's tongue  ? Hydroxyzine Hcl   ?  Per pt was given prior to scheduled tonsillectomy in 1975 and had convulsions under anesthesia  ? Prednisolone Other (See Comments)  ?  Convulsions per pt  ? Amoxicillin Diarrhea  ? Oxycodone Rash  ?  percocet  ? Pantoprazole Rash  ? Pneumococcal Vaccine Swelling and Rash  ?  Lymph node swelling  ? Sulfa Antibiotics Rash  ? Tape Rash  ?  Bandages, etc.  ? ? ?Objective:  ? ?BP 107/67   Pulse 82   Temp (!) 97.5 ?F (36.4 ?C) (Temporal)   Ht '5\' 5"'$  (1.651 m)   Wt 260 lb 3.2 oz (118 kg)   SpO2 95%   BMI 43.30 kg/m?   ? ?Physical Exam ?Vitals reviewed.  ?Constitutional:   ?   General: She is not in acute distress. ?   Appearance: Normal appearance. She is not ill-appearing, toxic-appearing or diaphoretic.  ?HENT:  ?   Head: Normocephalic and atraumatic.  ?Eyes:  ?   General: No scleral icterus.    ?   Right eye: No discharge.     ?   Left eye: No discharge.  ?   Conjunctiva/sclera: Conjunctivae normal.  ?Cardiovascular:  ?   Rate and Rhythm: Normal rate.  ?Pulmonary:  ?   Effort: Pulmonary effort is normal. No respiratory distress.  ?Musculoskeletal:     ?   General: Normal range of motion.  ?   Cervical back: Normal range of motion.  ?   Thoracic back: Normal.  ?   Right hip: Tenderness (muscular) present. No deformity, lacerations, bony tenderness or crepitus. Normal range of motion. Normal strength.  ?   Right upper leg: Tenderness (muscular) present. No swelling, edema, deformity, lacerations or bony tenderness.  ?Skin: ?   General: Skin is warm and dry.  ?   Capillary Refill: Capillary refill takes less than 2 seconds.  ?Neurological:  ?    General: No focal deficit present.  ?   Mental Status: She is alert and oriented to person, place, and time. Mental status is at baseline.  ?Psychiatric:     ?   Mood and Affect: Mood normal.     ?   Behavior: Behavior normal.     ?   Thought Content: Thought content normal.     ?   Judgment: Judgment normal.  ? ? ? ? ? ? ?

## 2021-05-07 ENCOUNTER — Encounter: Payer: Self-pay | Admitting: Pulmonary Disease

## 2021-05-07 DIAGNOSIS — G4733 Obstructive sleep apnea (adult) (pediatric): Secondary | ICD-10-CM

## 2021-05-07 NOTE — Telephone Encounter (Signed)
"  Can you increase the air pressure on my machine.  I don?t think that is is strong enough. I wake up with a very dry mouth. And I think it?s because the air is use moving through my mouth.  ?  ?I got a pillow mask.  ?  ?Thank you. " ? ?Dr. Elsworth Soho please advise. I have faxed over a CPAP DL report to the [place] office for you to look at.  ? ?Thanks!  ?

## 2021-05-13 ENCOUNTER — Encounter: Payer: Self-pay | Admitting: Family Medicine

## 2021-05-14 ENCOUNTER — Encounter: Payer: Self-pay | Admitting: Physical Therapy

## 2021-05-14 ENCOUNTER — Ambulatory Visit: Payer: BC Managed Care – PPO | Attending: Family Medicine | Admitting: Physical Therapy

## 2021-05-14 DIAGNOSIS — M79651 Pain in right thigh: Secondary | ICD-10-CM | POA: Insufficient documentation

## 2021-05-14 DIAGNOSIS — M25551 Pain in right hip: Secondary | ICD-10-CM | POA: Insufficient documentation

## 2021-05-14 NOTE — Therapy (Signed)
[facility] ?[address] ?[place], [place], [address] ?Phone: [phone]   Fax:  [phone] ? ?Physical Therapy Evaluation ? ?Patient Details  ?Name: Sherri Howard ?MRN: [id number] ?Date of Birth: 05/10/1955 ?Referring Provider (PT): Hendricks Limes FNP ? ? ?Encounter Date: 05/14/2021 ? ? PT End of Session - 05/14/21 1513   ? ? Visit Number 1   ? Number of Visits 12   ? Date for PT Re-Evaluation 06/25/21   ? Authorization Type FOTO AT LEAST EVERY 5TH VISIT.  PROGRESS NOTE AT 10TH VISIT.  KX MODIFIER AFTER 15 VISITS.   ? PT Start Time 0101   ? PT Stop Time 5537   ? PT Time Calculation (min) 52 min   ? Activity Tolerance Patient tolerated treatment well   ? Behavior During Therapy [facility] for tasks assessed/performed   ? ?  ?  ? ?  ? ? ?Past Medical History:  ?Diagnosis Date  ? Anemia   ? Anxiety   ? Chronic rhinitis   ? evaluated by dr y. Maudie Mercury (allergy & asthma center) note in epic 11-24-2019  ? Depression   ? DOE (dyspnea on exertion) 04-05-2020  per pt occasional gets sob sitting, lying, getting up and down, and with exertion  ? 04-05-2020 pt pcp sent to cardiologist,  dr Stanford Breed for evalution, note in epic 03-28-2020 (ordered echo and cardiac CT and they have been scheduled  ? Endometrial polyp   ? First degree heart block   ? Headache   ? History of adenomatous polyp of colon   ? History of kidney stones   ? History of palpitations   ? previous had event monitor 2017 showed SR/ first degree heart block, no arrhythmia's and normal echo  ? History of seizure   ? ED visit 08-22-2014 in epic for possible seizure, per ed note adverse reaction to prednisone stimulant   ? Hyperlipidemia   ? Hypertension   ? followed by pcp   and cardiology (nuclear stress test 01-11- 2018 in epic, normal perfusion no ischemia with normal LV function and wall motion, nuclear ef 74%)  ? IBS (irritable bowel syndrome)   ? Nephrolithiasis   ? 04-05-2020 per pt bilateral nonobstructive  ? OSA on CPAP   ?  study in epic 08-31-1998 mild osa, recommended cpap  ? PMB (postmenopausal bleeding)   ? Refusal of blood transfusions as patient is Jehovah's Witness   ? SUI (stress urinary incontinence, female)   ? ? ?Past Surgical History:  ?Procedure Laterality Date  ? BREAST SURGERY  1974  ? lumpectomy, per pt benign  ? CYSTOSCOPY/RETROGRADE/URETEROSCOPY  02-01-2008  '@[facility]'$   ? CYSTOSCOPY/URETEROSCOPY/HOLMIUM LASER/STENT PLACEMENT  2008;  11-07-2015 '@[facility]'$   ? HYSTEROSCOPY WITH D & C N/A 04/09/2020  ? Procedure: DILATATION AND CURETTAGE /HYSTEROSCOPY;  Surgeon: Salvadore Dom, MD;  Location: [facility];  Service: Gynecology;  Laterality: N/A;  ? LAPAROSCOPIC CHOLECYSTECTOMY  2014  ? LAPAROSCOPIC ILEOCECECTOMY  09/07/2012   @ [facility]  ? W/  APPENDECTOMY (done for large cecal polyp, non-malignant)  ? OVARIAN CYST SURGERY  1988  ? unsure which side  ? TONSILLECTOMY  05/1973  ? VENTRAL HERNIA REPAIR  07-31-2014   '@[facility]'$   ? incisional  (open)  ? ? ?There were no vitals filed for this visit. ? ? ? Subjective Assessment - 05/14/21 1428   ? ? Subjective The patient presents the clinic today with c/o right hip pain that has been ongoing for about a month ago.  She is also experiencing tingling into her right lateral thigh.  She also had a recent episode of low back pain.  She rates her pain at about a 3/10 today but it can rise to a 6-7 on occasions.  Medication can help decrease her pain somewhat.  A recent X-ray of her lumbar spine revealed:1. No acute fracture or malalignment of the lumbar spine.  2. Stable mild degenerative changes of the lower lumbar spine.  3. Stable left renal calculus.   ? Pertinent History CPAP, hernia repair, DOE, HTN, latex allergy.   ? Diagnostic tests X-ray.   ? Patient Stated Goals Not have pain.   ? Currently in Pain? Yes   ? Pain Score 3    ? Pain Location Hip   ? Pain Orientation Right   ? Pain Descriptors / Indicators Sore;Tingling   ? Pain Type Acute pain   ? Pain Onset 1 to 4 weeks ago    ? Pain Frequency Constant   ? Aggravating Factors  "Not sure."   ? Pain Relieving Factors Medication.   ? ?  ?  ? ?  ? ? ? ? ? [facility] PT Assessment - 05/14/21 0001   ? ?  ? Assessment  ? Medical Diagnosis Right hip pain.   ? Referring Provider (PT) Hendricks Limes FNP   ? Onset Date/Surgical Date --   ~ one month ago.  ?  ? Precautions  ? Precautions None   ?  ? Restrictions  ? Weight Bearing Restrictions No   ?  ? Balance Screen  ? Has the patient fallen in the past 6 months No   ? Has the patient had a decrease in activity level because of a fear of falling?  No   ? Is the patient reluctant to leave their home because of a fear of falling?  No   ?  ? Home Environment  ? Living Environment Private residence   ?  ? Prior Function  ? Level of Independence Independent   ?  ? Observation/Other Assessments  ? Focus on Therapeutic Outcomes (FOTO)  Complete.   ?  ? Posture/Postural Control  ? Posture/Postural Control No significant limitations   ?  ? Deep Tendon Reflexes  ? DTR Assessment Site Patella;Achilles   ? Patella DTR 2+   ? Achilles DTR 2+   ?  ? ROM / Strength  ? AROM / PROM / Strength AROM;Strength   ?  ? AROM  ? Overall AROM Comments Normal active lumbar flexion and extension.  Right hip range of motion assessed in supine is normal.   ?  ? Strength  ? Overall Strength Comments Normal right LE strength.   ?  ? Palpation  ? Palpation comment The patient is tender to palpation over her right glut med, TFL and over and posterior to her right greater trochanter.  She had some c/o pain in the right SIJ region.   ?  ? Special Tests  ? Other special tests Equal leg lengths, (-) right SLR, FABER and Hip Scour testing.   ?  ? Ambulation/Gait  ? Gait Comments WNL.   ? ?  ?  ? ?  ? ? ? ? ? ? ? ? ? ? ? ? ? ?Objective measurements completed on examination: See above findings.  ? ? ? ? ? [facility] Adult PT Treatment/Exercise - 05/14/21 0001   ? ?  ? Modalities  ? Modalities Electrical Stimulation   ?  ? Electrical Stimulation  ?  Electrical Stimulation Location Right lateral hip region.   ? Electrical Stimulation Action IFC at 80-150 Hz.   ? Electrical Stimulation Parameters 40% scan x 20 minutes. (Normal modality response following removal of modality).   ? Electrical Stimulation Goals Tone;Pain   ? ?  ?  ? ?  ? ? ? ? ? ? ? ? ? ? ? ? ? ? ? PT Long Term Goals - 05/14/21 1544   ? ?  ? PT LONG TERM GOAL #1  ? Title Independent with an HEP.   ? Time 6   ? Period Weeks   ? Status New   ?  ? PT LONG TERM GOAL #2  ? Title Perform ADL's with right hip pain not > 2/10.   ? Time 6   ? Period Weeks   ? Status New   ?  ? PT LONG TERM GOAL #3  ? Title Eliminate tingling in region of right lateral thigh.   ? Time 6   ? Period Weeks   ? Status New   ? ?  ?  ? ?  ? ? ? ? ? ? ? ? ? Plan - 05/14/21 1515   ? ? Clinical Impression Statement The patient presents to OPPT with c/o right hip pain and tingling down her lateral thigh region for about a month.  LE DTR's are intact.  The patient exhibits normal right hip range of motion and strength.  Special testing is negative. She has equal leg lengths. She did have significant palpable tenderness over her right lateral hip musculature and over and just posterior to her right greater trochanter. There was some pain complaint in the right SIJ region as well.   Treatment today comprised of IFC which patient enjoyed.  Information provided to patient today for obtaining a TENS unit.  Patient will benefit from skilled physical therapy intervention to address pain.   ? Personal Factors and Comorbidities Comorbidity 1;Other   ? Comorbidities CPAP, hernia repair, DOE, HTN, latex allergy.   ? Examination-Activity Limitations Other   ? Examination-Participation Restrictions Other   ? Stability/Clinical Decision Making Stable/Uncomplicated   ? Clinical Decision Making Low   ? Rehab Potential Excellent   ? PT Frequency 2x / week   ? PT Duration 6 weeks   ? PT Treatment/Interventions ADLs/Self Care Home  Management;Cryotherapy;Electrical Stimulation;Ultrasound;Moist Heat;Iontophoresis '4mg'$ /ml Dexamethasone;Therapeutic exercise;Therapeutic activities;Patient/family education;Manual techniques;Dry needling;Passive range of moti

## 2021-05-17 ENCOUNTER — Encounter: Payer: Self-pay | Admitting: Pulmonary Disease

## 2021-05-17 ENCOUNTER — Ambulatory Visit (INDEPENDENT_AMBULATORY_CARE_PROVIDER_SITE_OTHER): Payer: BC Managed Care – PPO | Admitting: Pulmonary Disease

## 2021-05-17 VITALS — BP 134/82 | HR 84 | Temp 97.8°F | Ht 65.0 in | Wt 259.0 lb

## 2021-05-17 DIAGNOSIS — G4733 Obstructive sleep apnea (adult) (pediatric): Secondary | ICD-10-CM | POA: Diagnosis not present

## 2021-05-17 DIAGNOSIS — Z9989 Dependence on other enabling machines and devices: Secondary | ICD-10-CM

## 2021-05-17 NOTE — Patient Instructions (Signed)
?   Trial of swift nasal pillows for her  ? ?X change autoCPAP to 8-12 cm ? ?Decrease ramp time ?

## 2021-05-17 NOTE — Assessment & Plan Note (Signed)
CPAP download was reviewed which shows excellent control of events on average pressure of 10 cm on auto 5 to 12 cm, compliance is good with a few missed nights, more than 5 hours per night on average and more than 6 hours on days used, minimal leak.  Overall CPAP is helped improve her daytime somnolence and fatigue she has reasonable compliance. ?I asked her to trial nasal pillows swift for her ?We will increase auto CPAP settings 8 to 12 cm to provide her increased pressure. ?She will decrease ramp time as needed ? ?Weight loss encouraged, compliance with goal of at least 4-6 hrs every night is the expectation. ?Advised against medications with sedative side effects ?Cautioned against driving when sleepy - understanding that sleepiness will vary on a day to day basis ? ?

## 2021-05-17 NOTE — Progress Notes (Signed)
? ?  Subjective:  ? ? [name], female    DOB: 06/13/1955, 66 y.o.   MRN: [id number] ? ?HPI ? ?66 yo woman  for follow-up of OSA. ?She was evaluated by me 06/2016 for dyspnea on exertion, CT angiogram was negative for PE.  PFTs showed severe restriction suggestive obesity ? ?-on CPAP since 2000 ? ?After her last office visit we repeated home sleep study which confirmed moderate OSA and we got her a new auto CPAP machine set between 5 to 12 cm.  Husband states that machine does not make any noise, he sees her resting well and no snoring has been noted. ?Patient feels like she needs more air and would like pressure to be increased, she also complains about the strap marks on her face in the morning.  She has settled down with nasal pillows ?She used a chinstrap that she obtained from a family member to decrease the leak but this is bothersome to use ? ?Sleep history obtained from spouse ?Significant tests/ events reviewed ? ?HST 09/2020 AHI 17/h, lowest desat 82% ? ?Split study 2000 >> wt 240 lbs -RDI 28/hour, CPAP 7 cm ?Allergy eval 11/24/19 : ? Allergy to red meat/ nl spirometry ?  ?08/2016 PFTs ERV 30%, no obs, mild restriction ?  ?CTA chest 06/2016 neg, fatty liver ?CT angiogram 08/2012 negative for pulmonary emboli ?Nuclear stress test 01/2016 low risk, EF was 74% ? ?Review of Systems ?neg for any significant sore throat, dysphagia, itching, sneezing, nasal congestion or excess/ purulent secretions, fever, chills, sweats, unintended wt loss, pleuritic or exertional cp, hempoptysis, orthopnea pnd or change in chronic leg swelling. Also denies presyncope, palpitations, heartburn, abdominal pain, nausea, vomiting, diarrhea or change in bowel or urinary habits, dysuria,hematuria, rash, arthralgias, visual complaints, headache, numbness weakness or ataxia. ? ?   ?Objective:  ? Physical Exam ? ?Gen. Pleasant, obese, in no distress ?ENT - no lesions, no post nasal drip ?Neck: No JVD, no thyromegaly, no carotid  bruits ?Lungs: no use of accessory muscles, no dullness to percussion, decreased without rales or rhonchi  ?Cardiovascular: Rhythm regular, heart sounds  normal, no murmurs or gallops, no peripheral edema ?Musculoskeletal: No deformities, no cyanosis or clubbing , no tremors ? ? ? ?   ?Assessment & Plan:  ? ? ?

## 2021-05-20 ENCOUNTER — Ambulatory Visit: Payer: BC Managed Care – PPO | Admitting: Physical Therapy

## 2021-05-20 ENCOUNTER — Ambulatory Visit: Payer: BC Managed Care – PPO | Admitting: Family Medicine

## 2021-05-20 ENCOUNTER — Encounter: Payer: Self-pay | Admitting: Physical Therapy

## 2021-05-20 ENCOUNTER — Other Ambulatory Visit: Payer: Self-pay

## 2021-05-20 DIAGNOSIS — M25551 Pain in right hip: Secondary | ICD-10-CM

## 2021-05-20 DIAGNOSIS — G4733 Obstructive sleep apnea (adult) (pediatric): Secondary | ICD-10-CM

## 2021-05-20 DIAGNOSIS — M79651 Pain in right thigh: Secondary | ICD-10-CM | POA: Diagnosis not present

## 2021-05-20 NOTE — Therapy (Signed)
[facility] ?[place] ?[place], [place], [address] ?Phone: [phone]   Fax:  [phone] ? ?Physical Therapy Treatment ? ?Patient Details  ?Name: Sherri Howard ?MRN: [id number] ?Date of Birth: 1955/05/09 ?Referring Provider (PT): Hendricks Limes FNP ? ? ?Encounter Date: 05/20/2021 ? ? PT End of Session - 05/20/21 1308   ? ? Visit Number 2   ? Number of Visits 12   ? Date for PT Re-Evaluation 06/25/21   ? Authorization Type FOTO AT LEAST EVERY 5TH VISIT.  PROGRESS NOTE AT 10TH VISIT.  KX MODIFIER AFTER 15 VISITS.   ? PT Start Time 1308   ? PT Stop Time 0623   ? PT Time Calculation (min) 43 min   ? Activity Tolerance Patient tolerated treatment well   ? Behavior During Therapy [facility] for tasks assessed/performed   ? ?  ?  ? ?  ? ? ?Past Medical History:  ?Diagnosis Date  ? Anemia   ? Anxiety   ? Chronic rhinitis   ? evaluated by dr y. Maudie Mercury ([facility]) note in epic 11-24-2019  ? Depression   ? DOE (dyspnea on exertion) 04-05-2020  per pt occasional gets sob sitting, lying, getting up and down, and with exertion  ? 04-05-2020 pt pcp sent to cardiologist,  dr Stanford Breed for evalution, note in epic 03-28-2020 (ordered echo and cardiac CT and they have been scheduled  ? Endometrial polyp   ? First degree heart block   ? Headache   ? History of adenomatous polyp of colon   ? History of kidney stones   ? History of palpitations   ? previous had event monitor 2017 showed SR/ first degree heart block, no arrhythmia's and normal echo  ? History of seizure   ? ED visit 08-22-2014 in epic for possible seizure, per ed note adverse reaction to prednisone stimulant   ? Hyperlipidemia   ? Hypertension   ? followed by pcp   and cardiology (nuclear stress test 01-11- 2018 in epic, normal perfusion no ischemia with normal LV function and wall motion, nuclear ef 74%)  ? IBS (irritable bowel syndrome)   ? Nephrolithiasis   ? 04-05-2020 per pt bilateral nonobstructive  ? OSA on CPAP   ?  study in epic 08-31-1998 mild osa, recommended cpap  ? PMB (postmenopausal bleeding)   ? Refusal of blood transfusions as patient is Jehovah's Witness   ? SUI (stress urinary incontinence, female)   ? ? ?Past Surgical History:  ?Procedure Laterality Date  ? BREAST SURGERY  1974  ? lumpectomy, per pt benign  ? CYSTOSCOPY/RETROGRADE/URETEROSCOPY  02-01-2008  '@[facility]'$   ? CYSTOSCOPY/URETEROSCOPY/HOLMIUM LASER/STENT PLACEMENT  2008;  11-07-2015 '@[facility]'$   ? HYSTEROSCOPY WITH D & C N/A 04/09/2020  ? Procedure: DILATATION AND CURETTAGE /HYSTEROSCOPY;  Surgeon: Salvadore Dom, MD;  [facility];  Service: Gynecology;  Laterality: N/A;  ? LAPAROSCOPIC CHOLECYSTECTOMY  2014  ? LAPAROSCOPIC ILEOCECECTOMY  09/07/2012   @ [place]  ? W/  APPENDECTOMY (done for large cecal polyp, non-malignant)  ? OVARIAN CYST SURGERY  1988  ? unsure which side  ? TONSILLECTOMY  05/1973  ? VENTRAL HERNIA REPAIR  07-31-2014   '@[facility]'$   ? incisional  (open)  ? ? ?There were no vitals filed for this visit. ? ? Subjective Assessment - 05/20/21 1307   ? ? Subjective Pain with palpation, laying on R side and some activities.   ? Pertinent History CPAP, hernia repair, DOE, HTN, latex allergy.   ?  Diagnostic tests X-ray.   ? Patient Stated Goals Not have pain.   ? Currently in Pain? Yes   ? Pain Score 1    ? Pain Location Hip   ? Pain Orientation Right   ? Pain Descriptors / Indicators Discomfort   ? Pain Type Acute pain   ? Pain Onset 1 to 4 weeks ago   ? Pain Frequency Constant   ? ?  ?  ? ?  ? ? ? ? ? [facility] PT Assessment - 05/20/21 0001   ? ?  ? Assessment  ? Medical Diagnosis Right hip pain.   ? Referring Provider (PT) Hendricks Limes FNP   ?  ? Precautions  ? Precautions None   ?  ? Restrictions  ? Weight Bearing Restrictions No   ? ?  ?  ? ?  ? ? ? ? ? ? ? ? ? ? ? ? ? ? ? ? [facility] Adult PT Treatment/Exercise - 05/20/21 0001   ? ?  ? Modalities  ? Modalities Electrical Stimulation;Moist Heat;Ultrasound   ?  ? Moist Heat Therapy  ? Number  Minutes Moist Heat 10 Minutes   ? Moist Heat Location Hip   ?  ? Electrical Stimulation  ? Electrical Stimulation Location R lateral hip   ? Electrical Stimulation Action Pre-Mod   ? Electrical Stimulation Parameters 80-150 hz x10 min   ? Electrical Stimulation Goals Tone;Pain   ?  ? Ultrasound  ? Ultrasound Location R lateral hip   ? Ultrasound Parameters Combo 1.5 w/cm2, 100%, 1 mhz x10 min   ? Ultrasound Goals Pain   ?  ? Manual Therapy  ? Manual Therapy Soft tissue mobilization   ? Soft tissue mobilization STW to R greater trochanter, TFL, ITB to reduce TPs and tone   ? ?  ?  ? ?  ? ? ? ? ? ? ? ? ? ? ? ? ? ? ? PT Long Term Goals - 05/14/21 1544   ? ?  ? PT LONG TERM GOAL #1  ? Title Independent with an HEP.   ? Time 6   ? Period Weeks   ? Status New   ?  ? PT LONG TERM GOAL #2  ? Title Perform ADL's with right hip pain not > 2/10.   ? Time 6   ? Period Weeks   ? Status New   ?  ? PT LONG TERM GOAL #3  ? Title Eliminate tingling in region of right lateral thigh.   ? Time 6   ? Period Weeks   ? Status New   ? ?  ?  ? ?  ? ? ? ? ? ? ? ? Plan - 05/20/21 1411   ? ? Clinical Impression Statement Patient presented in clinic with reports of continued R lateral hip pain which is worse with palpation, R SL, and certain activities. Patient had min-mod tightness and TPs throughout R TFL, ITB. No reported palpation pain during manual therapy. Normal modalities response noted following removal of the modalities.   ? Personal Factors and Comorbidities Comorbidity 1;Other   ? Comorbidities CPAP, hernia repair, DOE, HTN, latex allergy.   ? Examination-Activity Limitations Other   ? Examination-Participation Restrictions Other   ? Stability/Clinical Decision Making Stable/Uncomplicated   ? Rehab Potential Excellent   ? PT Frequency 2x / week   ? PT Duration 6 weeks   ? PT Treatment/Interventions ADLs/Self Care Home Management;Cryotherapy;Electrical Stimulation;Ultrasound;Moist Heat;Iontophoresis '4mg'$ /ml Dexamethasone;Therapeutic  exercise;Therapeutic activities;Patient/family education;Manual  techniques;Dry needling;Passive range of motion   ? PT Next Visit Plan Combo e'stim/US to right lateral hip musculature, STW/M, IFC, right hip stretches.  Hip bridges.   ? Consulted and Agree with Plan of Care Patient   ? ?  ?  ? ?  ? ? ?Patient will benefit from skilled therapeutic intervention in order to improve the following deficits and impairments:  Pain, Decreased activity tolerance, Increased muscle spasms ? ?Visit Diagnosis: ?Pain in right hip ? ? ? ? ?Problem List ?Patient Active Problem List  ? Diagnosis Date Noted  ? Edema 10/24/2020  ? Diarrhea 10/24/2020  ? Pharyngitis 07/18/2020  ? Vaginal discharge 03/13/2020  ? Vaginal bleeding 03/13/2020  ? Pruritic rash 11/24/2019  ? Pruritus 11/24/2019  ? Chronic rhinitis 11/24/2019  ? Multiple drug allergies 11/24/2019  ? Morbid obesity due to excess calories ([place]) 08/13/2016  ? Encounter for screening colonoscopy 07/17/2016  ? Depression, recurrent ([place]) 02/28/2016  ? Pyelonephritis 11/09/2015  ? Right ureteral calculus 11/07/2015  ? Encounter for opiate analgesic use agreement 09/20/2015  ? Pain medication agreement signed 09/20/2015  ? Acute bilateral low back pain without sciatica 08/24/2015  ? Fatigue 08/09/2015  ? Palpitations 06/26/2015  ? Hydronephrosis 06/18/2015  ? Flank pain 03/02/2015  ? DOE (dyspnea on exertion) 03/02/2015  ? Benign essential hypertension 12/20/2014  ? Chronic recurrent major depressive disorder ([place]) 12/20/2014  ? Dyslipidemia 12/20/2014  ? Generalized anxiety disorder 12/20/2014  ? Irritable bowel syndrome 12/20/2014  ? Morbid obesity with BMI of 40.0-44.9, adult ([place]) 12/20/2014  ? OSA on CPAP 12/20/2014  ? Lactose intolerance 12/20/2014  ? Insomnia 12/20/2014  ? Vitamin D deficiency 12/20/2014  ? Urinary incontinence 12/20/2014  ? Other specified postprocedural states 07/31/2014  ? S/P herniorrhaphy 07/31/2014  ? Ventral incisional hernia 06/16/2014  ? History of  colectomy 09/07/2012  ? Adenomatous colon polyp 07/21/2012  ? History of renal calculi 07/21/2012  ? ? ?Standley Brooking, PTA ?05/20/2021, 2:13 PM ? ?[place] [facility] ?4

## 2021-05-20 NOTE — Progress Notes (Signed)
Corrected order per [facility] recommendation  ? ?

## 2021-05-20 NOTE — Addendum Note (Signed)
Addended by: Fritzi Mandes D on: 05/20/2021 11:35 AM ? ? Modules accepted: Orders ? ?

## 2021-05-21 ENCOUNTER — Other Ambulatory Visit (HOSPITAL_COMMUNITY): Payer: Self-pay | Admitting: Nephrology

## 2021-05-21 ENCOUNTER — Other Ambulatory Visit: Payer: Self-pay | Admitting: Nephrology

## 2021-05-21 DIAGNOSIS — N1831 Chronic kidney disease, stage 3a: Secondary | ICD-10-CM | POA: Diagnosis not present

## 2021-05-21 DIAGNOSIS — N138 Other obstructive and reflux uropathy: Secondary | ICD-10-CM

## 2021-05-21 DIAGNOSIS — N200 Calculus of kidney: Secondary | ICD-10-CM

## 2021-05-21 DIAGNOSIS — I129 Hypertensive chronic kidney disease with stage 1 through stage 4 chronic kidney disease, or unspecified chronic kidney disease: Secondary | ICD-10-CM

## 2021-05-21 DIAGNOSIS — R319 Hematuria, unspecified: Secondary | ICD-10-CM

## 2021-05-21 DIAGNOSIS — G4733 Obstructive sleep apnea (adult) (pediatric): Secondary | ICD-10-CM | POA: Diagnosis not present

## 2021-05-23 DIAGNOSIS — N138 Other obstructive and reflux uropathy: Secondary | ICD-10-CM | POA: Diagnosis not present

## 2021-05-23 DIAGNOSIS — I129 Hypertensive chronic kidney disease with stage 1 through stage 4 chronic kidney disease, or unspecified chronic kidney disease: Secondary | ICD-10-CM | POA: Diagnosis not present

## 2021-05-23 DIAGNOSIS — N200 Calculus of kidney: Secondary | ICD-10-CM | POA: Diagnosis not present

## 2021-05-23 DIAGNOSIS — R319 Hematuria, unspecified: Secondary | ICD-10-CM | POA: Diagnosis not present

## 2021-05-23 DIAGNOSIS — N1831 Chronic kidney disease, stage 3a: Secondary | ICD-10-CM | POA: Diagnosis not present

## 2021-05-24 DIAGNOSIS — Z79899 Other long term (current) drug therapy: Secondary | ICD-10-CM | POA: Diagnosis not present

## 2021-05-24 DIAGNOSIS — Z1159 Encounter for screening for other viral diseases: Secondary | ICD-10-CM | POA: Diagnosis not present

## 2021-05-24 DIAGNOSIS — N1831 Chronic kidney disease, stage 3a: Secondary | ICD-10-CM | POA: Diagnosis not present

## 2021-05-24 DIAGNOSIS — N138 Other obstructive and reflux uropathy: Secondary | ICD-10-CM | POA: Diagnosis not present

## 2021-05-29 ENCOUNTER — Ambulatory Visit (HOSPITAL_COMMUNITY)
Admission: RE | Admit: 2021-05-29 | Discharge: 2021-05-29 | Disposition: A | Discharge status: Home / Self Care | Payer: BC Managed Care – PPO | Source: Ambulatory Visit | Attending: Nephrology | Admitting: Nephrology

## 2021-05-29 DIAGNOSIS — R319 Hematuria, unspecified: Secondary | ICD-10-CM | POA: Insufficient documentation

## 2021-05-29 DIAGNOSIS — N200 Calculus of kidney: Secondary | ICD-10-CM | POA: Diagnosis not present

## 2021-05-29 DIAGNOSIS — N1831 Chronic kidney disease, stage 3a: Secondary | ICD-10-CM | POA: Diagnosis not present

## 2021-05-29 DIAGNOSIS — N281 Cyst of kidney, acquired: Secondary | ICD-10-CM | POA: Diagnosis not present

## 2021-05-29 DIAGNOSIS — N138 Other obstructive and reflux uropathy: Secondary | ICD-10-CM | POA: Insufficient documentation

## 2021-05-29 DIAGNOSIS — I129 Hypertensive chronic kidney disease with stage 1 through stage 4 chronic kidney disease, or unspecified chronic kidney disease: Secondary | ICD-10-CM | POA: Diagnosis not present

## 2021-05-29 DIAGNOSIS — N189 Chronic kidney disease, unspecified: Secondary | ICD-10-CM | POA: Diagnosis not present

## 2021-05-31 ENCOUNTER — Ambulatory Visit: Payer: BC Managed Care – PPO | Admitting: Physical Therapy

## 2021-06-01 DIAGNOSIS — E211 Secondary hyperparathyroidism, not elsewhere classified: Secondary | ICD-10-CM | POA: Diagnosis not present

## 2021-06-01 DIAGNOSIS — Z882 Allergy status to sulfonamides status: Secondary | ICD-10-CM | POA: Diagnosis not present

## 2021-06-01 DIAGNOSIS — R809 Proteinuria, unspecified: Secondary | ICD-10-CM | POA: Diagnosis not present

## 2021-06-01 DIAGNOSIS — I129 Hypertensive chronic kidney disease with stage 1 through stage 4 chronic kidney disease, or unspecified chronic kidney disease: Secondary | ICD-10-CM | POA: Diagnosis not present

## 2021-06-01 DIAGNOSIS — I10 Essential (primary) hypertension: Secondary | ICD-10-CM | POA: Diagnosis not present

## 2021-06-01 DIAGNOSIS — Z87440 Personal history of urinary (tract) infections: Secondary | ICD-10-CM | POA: Diagnosis not present

## 2021-06-01 DIAGNOSIS — Z888 Allergy status to other drugs, medicaments and biological substances status: Secondary | ICD-10-CM | POA: Diagnosis not present

## 2021-06-01 DIAGNOSIS — Z91040 Latex allergy status: Secondary | ICD-10-CM | POA: Diagnosis not present

## 2021-06-01 DIAGNOSIS — N1831 Chronic kidney disease, stage 3a: Secondary | ICD-10-CM | POA: Diagnosis not present

## 2021-06-01 NOTE — ED Provider Notes (Signed)
Received call from Dr. Theador Hawthorne that patient had pseudomonas grow out of her urine.  She is allergic to cipro and will be sent to ED.  Discussed with pharmacist and states it is possible to treat this orally with fosfomycin, however there is not widespread agreement and sensitivities are not done. Discussed this with Dr. Theador Hawthorne and he will make decision regarding sending patient onto the ED.   Pattricia Boss, MD 06/01/21 [phone]

## 2021-06-07 ENCOUNTER — Ambulatory Visit: Payer: BC Managed Care – PPO | Admitting: Physical Therapy

## 2021-06-07 ENCOUNTER — Encounter: Payer: Self-pay | Admitting: Physical Therapy

## 2021-06-07 ENCOUNTER — Ambulatory Visit: Payer: BC Managed Care – PPO | Admitting: Pulmonary Disease

## 2021-06-07 DIAGNOSIS — M25551 Pain in right hip: Secondary | ICD-10-CM | POA: Diagnosis not present

## 2021-06-07 DIAGNOSIS — M79651 Pain in right thigh: Secondary | ICD-10-CM | POA: Diagnosis not present

## 2021-06-07 NOTE — Therapy (Addendum)
[facility] [place], [place], [address] Phone: [phone]   Fax:  [phone]  Physical Therapy Treatment  Patient Details  Name: Sherri Howard MRN: [id number] Date of Birth: 1955/02/26 Referring Provider (PT): Hendricks Limes FNP   Encounter Date: 06/07/2021   PT End of Session - 06/07/21 1155     Visit Number 3    Number of Visits 12    Date for PT Re-Evaluation 06/25/21    Authorization Type FOTO AT LEAST EVERY 5TH VISIT.  PROGRESS NOTE AT 10TH VISIT.  KX MODIFIER AFTER 15 VISITS.    PT Start Time 1112    PT Stop Time 1132    PT Time Calculation (min) 20 min    Activity Tolerance Patient tolerated treatment well    Behavior During Therapy WFL for tasks assessed/performed             Past Medical History:  Diagnosis Date   Anemia    Anxiety    Chronic rhinitis    evaluated by dr y. Maudie Mercury (all[facility] & asthma center) note in epic 11-24-2019   Depression    DOE (dyspnea on exertion) 04-05-2020  per pt occasional gets sob sitting, lying, getting up and down, and with exertion   04-05-2020 pt pcp sent to cardiologist,  dr Stanford Breed for evalution, note in epic 03-28-2020 (ordered echo and cardiac CT and they have been scheduled   Endometrial polyp    First degree heart block    Headache    History of adenomatous polyp of colon    History of kidney stones    History of palpitations    previous had event monitor 2017 showed SR/ first degree heart block, no arrhythmia's and normal echo   History of seizure    ED visit 08-22-2014 in epic for possible seizure, per ed note adverse reaction to prednisone stimulant    Hyperlipidemia    Hypertension    followed by pcp   and cardiology (nuclear stress test 01-11- 2018 in epic, normal perfusion no ischemia with normal LV function and wall motion, nuclear ef 74%)   IBS (irritable bowel syndrome)    Nephrolithiasis    04-05-2020 per pt bilateral nonobstructive   OSA on CPAP     study in epic 08-31-1998 mild osa, recommended cpap   PMB (postmenopausal bleeding)    Refusal of blood transfusions as patient is Jehovah's Witness    SUI (stress urinary incontinence, female)     Past Surgical History:  Procedure Laterality Date   BREAST SURGERY  1974   lumpectomy, per pt benign   CYSTOSCOPY/RETROGRADE/URETEROSCOPY  02-01-2008  _0    CYSTOSCOPY/URETEROSCOPY/HOLMIUM LASER/STENT PLACEMENT  2008;  11-07-2015 _1    HYSTEROSCOPY WITH D & C N/A 04/09/2020   Procedure: DILATATION AND CURETTAGE /HYSTEROSCOPY;  Surgeon: Salvadore Dom, MD;  Location: [facility];  Service: Gynecology;  Laterality: N/A;   LAPAROSCOPIC CHOLECYSTECTOMY  2014   LAPAROSCOPIC ILEOCECECTOMY  09/07/2012   @ [facility]   W/  APPENDECTOMY (done for large cecal polyp, non-malignant)   OVARIAN CYST SURGERY  1988   unsure which side   TONSILLECTOMY  05/1973   VENTRAL HERNIA REPAIR  07-31-2014   _2    incisional  (open)    There were no vitals filed for this visit.   Subjective Assessment - 06/07/21 1145     Subjective Reports that she figured out what has caused the pain as her bed was angled up due to her husband's acid reflux.  Pertinent History CPAP, hernia repair, DOE, HTN, latex allergy.    Diagnostic tests X-ray.    Patient Stated Goals Not have pain.    Currently in Pain? No/denies                [facility] PT Assessment - 06/07/21 0001       Assessment   Medical Diagnosis Right hip pain.    Referring Provider (PT) Hendricks Limes FNP      Precautions   Precautions None                           [facility] Adult PT Treatment/Exercise - 06/07/21 0001       Exercises   Exercises Knee/Hip      Knee/Hip Exercises: Stretches   Passive Hamstring Stretch Right;3 reps;30 seconds    ITB Stretch Right;1 rep;30 seconds    Other Knee/Hip Stretches R figure 4 stretch supine x30 sec    Other Knee/Hip Stretches R SKTC 3x30 sec      Knee/Hip Exercises:  Standing   Hip Abduction AROM;Right;10 reps    Hip Extension AROM;Right;5 reps      Knee/Hip Exercises: Supine   Constance Haw Strengthening;5 reps                     PT Education - 06/07/21 1144     Education Details [id number]    Person(s) Educated Patient    Methods Explanation;Demonstration;Handout    Comprehension Verbalized understanding;Returned demonstration                 PT Long Term Goals - 06/07/21 1202       PT LONG TERM GOAL #1   Title Independent with an HEP.    Time 6    Period Weeks    Status Achieved      PT LONG TERM GOAL #2   Title Perform ADL's with right hip pain not > 2/10.    Time 6    Period Weeks    Status Achieved      PT LONG TERM GOAL #3   Title Eliminate tingling in region of right lateral thigh.    Time 6    Period Weeks    Status Achieved                   Plan - 06/07/21 1158     Clinical Impression Statement Patient presented in clinic with no pain in R hip. Patient recently found the mechanism of injury as her HOB was raised up due to her husband's acid reflux. Patient guided through different hip stretches to be utilized especially to prevent another flare up. Hip strengthening also added. Patient able to complete demonstration for all therex. New HEP provided with thorough education regarding technique and parameters. Patient denied any modalities or other form of treatment.    Personal Factors and Comorbidities Comorbidity 1;Other    Comorbidities CPAP, hernia repair, DOE, HTN, latex allergy.    Examination-Activity Limitations Other    Examination-Participation Restrictions Other    Stability/Clinical Decision Making Stable/Uncomplicated    Rehab Potential Excellent    PT Frequency 2x / week    PT Duration 6 weeks    PT Treatment/Interventions ADLs/Self Care Home Management;Cryotherapy;Electrical Stimulation;Ultrasound;Moist Heat;Iontophoresis 20m/ml Dexamethasone;Therapeutic exercise;Therapeutic  activities;Patient/family education;Manual techniques;Dry needling;Passive range of motion    PT Next Visit Plan D/C    Consulted and Agree with Plan of Care Patient  Patient will benefit from skilled therapeutic intervention in order to improve the following deficits and impairments:  Pain, Decreased activity tolerance, Increased muscle spasms  Visit Diagnosis: Pain in right hip     Problem List Patient Active Problem List   Diagnosis Date Noted   Edema 10/24/2020   Diarrhea 10/24/2020   Pharyngitis 07/18/2020   Vaginal discharge 03/13/2020   Vaginal bleeding 03/13/2020   Pruritic rash 11/24/2019   Pruritus 11/24/2019   Chronic rhinitis 11/24/2019   Multiple drug allergies 11/24/2019   Morbid obesity due to excess calories ([place]) 08/13/2016   Encounter for screening colonoscopy 07/17/2016   Depression, recurrent ([place]) 02/28/2016   Pyelonephritis 11/09/2015   Right ureteral calculus 11/07/2015   Encounter for opiate analgesic use agreement 09/20/2015   Pain medication agreement signed 09/20/2015   Acute bilateral low back pain without sciatica 08/24/2015   Fatigue 08/09/2015   Palpitations 06/26/2015   Hydronephrosis 06/18/2015   Flank pain 03/02/2015   DOE (dyspnea on exertion) 03/02/2015   Benign essential hypertension 12/20/2014   Chronic recurrent major depressive disorder ([place]) 12/20/2014   Dyslipidemia 12/20/2014   Generalized anxiety disorder 12/20/2014   Irritable bowel syndrome 12/20/2014   Morbid obesity with BMI of 40.0-44.9, adult ([place]) 12/20/2014   OSA on CPAP 12/20/2014   Lactose intolerance 12/20/2014   Insomnia 12/20/2014   Vitamin D deficiency 12/20/2014   Urinary incontinence 12/20/2014   Other specified postprocedural states 07/31/2014   S/P herniorrhaphy 07/31/2014   Ventral incisional hernia 06/16/2014   History of colectomy 09/07/2012   Adenomatous colon polyp 07/21/2012   History of renal calculi 07/21/2012    Standley Brooking, PTA 06/07/2021, 12:02 PM  [facility] [address] [place], [place], [address] Phone: [phone]   Fax:  [phone]  Name: NADEA KIRKLAND MRN: [id number] Date of Birth: 1955/04/10  PHYSICAL THERAPY DISCHARGE SUMMARY  Visits from Start of Care: 3  Current functional level related to goals / functional outcomes: Patient has met all of her goals for physical therapy.    Remaining deficits: None   Education / Equipment: HEP    Patient agrees to discharge. Patient goals were met. Patient is being discharged due to meeting the stated rehab goals.  Jacqulynn Cadet, PT, DPT

## 2021-06-24 ENCOUNTER — Encounter: Payer: Self-pay | Admitting: Family Medicine

## 2021-06-24 DIAGNOSIS — I10 Essential (primary) hypertension: Secondary | ICD-10-CM

## 2021-06-24 MED ORDER — LOSARTAN POTASSIUM 100 MG PO TABS: 100.0000 mg | ORAL_TABLET | Freq: Every day | ORAL | 3 refills | Status: DC

## 2021-07-01 ENCOUNTER — Ambulatory Visit (INDEPENDENT_AMBULATORY_CARE_PROVIDER_SITE_OTHER): Payer: 59 | Admitting: Cardiology

## 2021-07-01 ENCOUNTER — Encounter: Payer: Self-pay | Admitting: Cardiology

## 2021-07-01 VITALS — BP 126/86 | HR 74 | Ht 65.0 in | Wt 250.0 lb

## 2021-07-01 DIAGNOSIS — I2510 Atherosclerotic heart disease of native coronary artery without angina pectoris: Secondary | ICD-10-CM | POA: Diagnosis not present

## 2021-07-01 DIAGNOSIS — E785 Hyperlipidemia, unspecified: Secondary | ICD-10-CM

## 2021-07-01 DIAGNOSIS — R0609 Other forms of dyspnea: Secondary | ICD-10-CM | POA: Diagnosis not present

## 2021-07-01 DIAGNOSIS — I10 Essential (primary) hypertension: Secondary | ICD-10-CM | POA: Diagnosis not present

## 2021-07-01 MED ORDER — AMLODIPINE BESYLATE 5 MG PO TABS: 5.0000 mg | ORAL_TABLET | Freq: Every day | ORAL | 3 refills | Status: DC

## 2021-07-01 NOTE — Patient Instructions (Signed)
Medication Instructions:   DECREASE AMLODIPINE TO 5 MG ONCE DAILY= 1/2 OF THE 10 MG TABLET ONCE DAILY  *If you need a refill on your cardiac medications before your next appointment, please call your pharmacy*   Lab Work:  Your physician recommends that you return for lab work LIPID WITH NEXT LAB DRAW  If you have labs (blood work) drawn today and your tests are completely normal, you will receive your results only by: [place] (if you have MyChart) OR A paper copy in the mail If you have any lab test that is abnormal or we need to change your treatment, we will call you to review the results.   Follow-Up: At [facility], you and your health needs are our priority.  As part of our continuing mission to provide you with exceptional heart care, we have created designated Provider Care Teams.  These Care Teams include your primary Cardiologist (physician) and Advanced Practice Providers (APPs -  Physician Assistants and Nurse Practitioners) who all work together to provide you with the care you need, when you need it.  We recommend signing up for the patient portal called "MyChart".  Sign up information is provided on this After Visit Summary.  MyChart is used to connect with patients for Virtual Visits (Telemedicine).  Patients are able to view lab/test results, encounter notes, upcoming appointments, etc.  Non-urgent messages can be sent to your provider as well.   To learn more about what you can do with MyChart, go to [email or web address].    Your next appointment:   12 month(s)  The format for your next appointment:   In Person  Provider:   Kirk Ruths, MD{    Important Information About [place]

## 2021-07-01 NOTE — Progress Notes (Signed)
HPI: Follow-up dyspnea. Nuclear study 2018 showed ejection fraction 74% and normal perfusion.  Echocardiogram April 2022 showed normal LV function, mild left ventricular hypertrophy, grade 1 diastolic dysfunction, trace aortic insufficiency.  Calcium score April 2022 was 4 which was 50th percentile; aortic atherosclerosis noted.  Since last seen she has dyspnea with more vigorous activities but not routine activities.  No orthopnea, PND, pedal edema, chest pain or syncope.  She does have some dizziness with standing.  Current Outpatient Medications  Medication Sig Dispense Refill   acetaminophen (TYLENOL) 325 MG tablet Take 650 mg by mouth every 6 (six) hours as needed for mild pain or moderate pain.      ALPRAZolam (XANAX) 0.5 MG tablet Take 0.5-1 tablets (0.25-0.5 mg total) by mouth daily as needed for anxiety. May take extra tablet during the day prn panic. 45 tablet 5   amLODipine (NORVASC) 10 MG tablet Take by mouth.     busPIRone (BUSPAR) 10 MG tablet Take 2 tablets (20 mg total) by mouth 2 (two) times daily. FOR ANXIETY 360 tablet 3   carvedilol (COREG) 6.25 MG tablet TAKE 1 TABLET TWICE A DAY 180 tablet 3   diphenhydrAMINE (BENADRYL) 25 mg capsule Take by mouth.     famotidine (PEPCID) 20 MG tablet Take one tablet ('20mg'$ ) by mouth twice a day 180 tablet 3   hyoscyamine (LEVSIN SL) 0.125 MG SL tablet DISSOLVE 1 TABLET UNDER THE TONGUE EVERY 6 HOURS AS NEEDED 30 tablet 0   Insulin Pen Needle 32G X 6 MM MISC UAD with saxenda 100 each 3   Liraglutide -Weight Management (SAXENDA) 18 MG/3ML SOPN Inject 3 mg into the skin daily. 45 mL 3   losartan (COZAAR) 100 MG tablet Take 1 tablet (100 mg total) by mouth daily. 90 tablet 3   methocarbamol (ROBAXIN) 500 MG tablet Take 1 tablet (500 mg total) by mouth every 8 (eight) hours as needed for muscle spasms. 60 tablet 0   rosuvastatin (CRESTOR) 20 MG tablet TAKE 1 TABLET DAILY 90 tablet 3   VIIBRYD 40 MG TABS Take 1 tablet (40 mg total) by mouth  daily. 90 tablet 3   No current facility-administered medications for this visit.     Past Medical History:  Diagnosis Date   Anemia    Anxiety    Chronic rhinitis    evaluated by dr y. Maudie Mercury ([facility]) note in epic 11-24-2019   Depression    DOE (dyspnea on exertion) 04-05-2020  per pt occasional gets sob sitting, lying, getting up and down, and with exertion   04-05-2020 pt pcp sent to cardiologist,  dr Stanford Breed for evalution, note in epic 03-28-2020 (ordered echo and cardiac CT and they have been scheduled   Endometrial polyp    First degree heart block    Headache    History of adenomatous polyp of colon    History of kidney stones    History of palpitations    previous had event monitor 2017 showed SR/ first degree heart block, no arrhythmia's and normal echo   History of seizure    ED visit 08-22-2014 in epic for possible seizure, per ed note adverse reaction to prednisone stimulant    Hyperlipidemia    Hypertension    followed by pcp   and cardiology (nuclear stress test 01-11- 2018 in epic, normal perfusion no ischemia with normal LV function and wall motion, nuclear ef 74%)   IBS (irritable bowel syndrome)    Nephrolithiasis  04-05-2020 per pt bilateral nonobstructive   OSA on CPAP    study in epic 08-31-1998 mild osa, recommended cpap   PMB (postmenopausal bleeding)    Refusal of blood transfusions as patient is Jehovah's Witness    SUI (stress urinary incontinence, female)     Past Surgical History:  Procedure Laterality Date   BREAST SURGERY  1974   lumpectomy, per pt benign   CYSTOSCOPY/RETROGRADE/URETEROSCOPY  02-01-2008  '@[facility]'$    CYSTOSCOPY/URETEROSCOPY/HOLMIUM LASER/STENT PLACEMENT  2008;  11-07-2015 '@[facility]'$    HYSTEROSCOPY WITH D & C N/A 04/09/2020   Procedure: DILATATION AND CURETTAGE /HYSTEROSCOPY;  Surgeon: Salvadore Dom, MD;  Location: [facility];  Service: Gynecology;  Laterality: N/A;   LAPAROSCOPIC CHOLECYSTECTOMY   2014   LAPAROSCOPIC ILEOCECECTOMY  09/07/2012   @ [facility]   W/  APPENDECTOMY (done for large cecal polyp, non-malignant)   OVARIAN CYST SURGERY  1988   unsure which side   TONSILLECTOMY  05/1973   VENTRAL HERNIA REPAIR  07-31-2014   '@[facility]'$    incisional  (open)    Social History   Socioeconomic History   Marital status: Married    Spouse name: Not on file   Number of children: 1   Years of education: Not on file   Highest education level: Not on file  Occupational History   Not on file  Tobacco Use   Smoking status: Never   Smokeless tobacco: Never  Vaping Use   Vaping Use: Never used  Substance and Sexual Activity   Alcohol use: Not Currently    Comment: Rare   Drug use: Never   Sexual activity: Not on file  Other Topics Concern   Not on file  Social History Narrative   Not on file   Social Determinants of Health   Financial Resource Strain: Not on file  Food Insecurity: Not on file  Transportation Needs: Not on file  Physical Activity: Not on file  Stress: Not on file  Social Connections: Not on file  Intimate Partner Violence: Not on file    Family History  Problem Relation Age of Onset   Cancer Mother    Aneurysm Mother    Allergic rhinitis Mother    Breast cancer Mother 76   Atrial fibrillation Mother    Cancer Father    Hypertension Father    Alzheimer's disease Father    CAD Father    Allergic rhinitis Brother    COPD Maternal Aunt    Eczema Son    Bronchitis Son    Sinusitis Son    Asthma Neg Hx    Urticaria Neg Hx    Immunodeficiency Neg Hx    Atopy Neg Hx    Angioedema Neg Hx     ROS: no fevers or chills, productive cough, hemoptysis, dysphasia, odynophagia, melena, hematochezia, dysuria, hematuria, rash, seizure activity, orthopnea, PND, pedal edema, claudication. Remaining systems are negative.  Physical Exam: Well-developed well-nourished in no acute distress.  Skin is warm and dry.  HEENT is normal.  Neck is supple.  Chest is  clear to auscultation with normal expansion.  Cardiovascular exam is regular rate and rhythm.  Abdominal exam nontender or distended. No masses palpated. Extremities show no edema. neuro grossly intact  ECG-normal sinus rhythm at a rate of 74, first-degree AV block, no ST changes.  Personally reviewed  A/P  1 dyspnea-previous evaluation included an echocardiogram which revealed normal LV function and minimally elevated calcium score.  This may be from a combination of obesity hypoventilation  syndrome and sleep apnea.  2 hypertension-blood pressure controlled.  However she has some orthostatic symptoms.  Decrease amlodipine to 5 mg daily and follow.  3 hyperlipidemia-continue statin.  Check lipids.  Recent liver functions normal.  4 obesity-we discussed the importance of diet, exercise and weight loss.  5 coronary calcification-mildly elevated calcium score.  Continue statin.  She does have a family history of coronary disease.  If LDL not at goal we will increase Crestor further.  Kirk Ruths, MD

## 2021-07-02 DIAGNOSIS — N1831 Chronic kidney disease, stage 3a: Secondary | ICD-10-CM | POA: Diagnosis not present

## 2021-07-02 DIAGNOSIS — N3091 Cystitis, unspecified with hematuria: Secondary | ICD-10-CM | POA: Diagnosis not present

## 2021-07-02 DIAGNOSIS — N200 Calculus of kidney: Secondary | ICD-10-CM | POA: Diagnosis not present

## 2021-07-02 DIAGNOSIS — R760 Raised antibody titer: Secondary | ICD-10-CM | POA: Diagnosis not present

## 2021-07-02 DIAGNOSIS — E785 Hyperlipidemia, unspecified: Secondary | ICD-10-CM | POA: Diagnosis not present

## 2021-07-02 DIAGNOSIS — E211 Secondary hyperparathyroidism, not elsewhere classified: Secondary | ICD-10-CM | POA: Diagnosis not present

## 2021-07-02 DIAGNOSIS — R809 Proteinuria, unspecified: Secondary | ICD-10-CM | POA: Diagnosis not present

## 2021-07-02 DIAGNOSIS — Z6841 Body Mass Index (BMI) 40.0 and over, adult: Secondary | ICD-10-CM | POA: Diagnosis not present

## 2021-07-02 DIAGNOSIS — I129 Hypertensive chronic kidney disease with stage 1 through stage 4 chronic kidney disease, or unspecified chronic kidney disease: Secondary | ICD-10-CM | POA: Diagnosis not present

## 2021-07-03 LAB — LIPID PANEL
Cholesterol: 111 mg/dL (ref <200)
HDL: 52 mg/dL (ref >=50)
LDL Cholesterol (Calc): 39 mg/dL (calc)
Non-HDL Cholesterol (Calc): 59 mg/dL (calc) (ref <130)
Total CHOL/HDL Ratio: 2.1 (calc) (ref <5.0)
Triglycerides: 112 mg/dL (ref <150)

## 2021-07-04 DIAGNOSIS — R319 Hematuria, unspecified: Secondary | ICD-10-CM | POA: Diagnosis not present

## 2021-07-04 DIAGNOSIS — N200 Calculus of kidney: Secondary | ICD-10-CM | POA: Diagnosis not present

## 2021-07-04 DIAGNOSIS — N138 Other obstructive and reflux uropathy: Secondary | ICD-10-CM | POA: Diagnosis not present

## 2021-07-04 DIAGNOSIS — N1831 Chronic kidney disease, stage 3a: Secondary | ICD-10-CM | POA: Diagnosis not present

## 2021-07-04 DIAGNOSIS — R809 Proteinuria, unspecified: Secondary | ICD-10-CM | POA: Diagnosis not present

## 2021-07-10 DIAGNOSIS — E211 Secondary hyperparathyroidism, not elsewhere classified: Secondary | ICD-10-CM | POA: Diagnosis not present

## 2021-07-10 DIAGNOSIS — N1831 Chronic kidney disease, stage 3a: Secondary | ICD-10-CM | POA: Diagnosis not present

## 2021-07-10 DIAGNOSIS — R809 Proteinuria, unspecified: Secondary | ICD-10-CM | POA: Diagnosis not present

## 2021-07-10 DIAGNOSIS — I129 Hypertensive chronic kidney disease with stage 1 through stage 4 chronic kidney disease, or unspecified chronic kidney disease: Secondary | ICD-10-CM | POA: Diagnosis not present

## 2021-07-10 DIAGNOSIS — N3091 Cystitis, unspecified with hematuria: Secondary | ICD-10-CM | POA: Diagnosis not present

## 2021-07-17 ENCOUNTER — Other Ambulatory Visit (HOSPITAL_COMMUNITY): Payer: Self-pay | Admitting: Nephrology

## 2021-07-17 DIAGNOSIS — N200 Calculus of kidney: Secondary | ICD-10-CM

## 2021-07-17 DIAGNOSIS — N2581 Secondary hyperparathyroidism of renal origin: Secondary | ICD-10-CM

## 2021-07-22 ENCOUNTER — Encounter (HOSPITAL_COMMUNITY)
Admission: RE | Admit: 2021-07-22 | Discharge: 2021-07-22 | Disposition: A | Discharge status: Home / Self Care | Payer: 59 | Source: Ambulatory Visit | Attending: Nephrology | Admitting: Nephrology

## 2021-07-22 DIAGNOSIS — N2581 Secondary hyperparathyroidism of renal origin: Secondary | ICD-10-CM | POA: Diagnosis not present

## 2021-07-22 DIAGNOSIS — N200 Calculus of kidney: Secondary | ICD-10-CM | POA: Diagnosis not present

## 2021-07-22 DIAGNOSIS — D351 Benign neoplasm of parathyroid gland: Secondary | ICD-10-CM | POA: Diagnosis not present

## 2021-07-22 MED ORDER — TECHNETIUM TC 99M SESTAMIBI - CARDIOLITE
25.0000 | Freq: Once | INTRAVENOUS | Status: AC | PRN
  Administered 2021-07-22: 25.2 via INTRAVENOUS

## 2021-07-23 ENCOUNTER — Encounter: Payer: Self-pay | Admitting: Infectious Diseases

## 2021-07-23 ENCOUNTER — Ambulatory Visit (INDEPENDENT_AMBULATORY_CARE_PROVIDER_SITE_OTHER): Payer: 59 | Admitting: Infectious Diseases

## 2021-07-23 ENCOUNTER — Other Ambulatory Visit: Payer: Self-pay

## 2021-07-23 VITALS — BP 113/78 | HR 80 | Temp 98.0°F | Ht 64.0 in | Wt 252.0 lb

## 2021-07-23 DIAGNOSIS — R8271 Bacteriuria: Secondary | ICD-10-CM

## 2021-07-23 DIAGNOSIS — Z2239 Carrier of other specified bacterial diseases: Secondary | ICD-10-CM | POA: Diagnosis not present

## 2021-07-23 NOTE — Progress Notes (Signed)
Patient: Sherri Howard  DOB: Oct 28, 1955 MRN: [id number] PCP: [name], DO   Subjective:  Sherri Howard is a 66 y.o. female here for new patient evaluation for pseudomonas bacteriuria.   She has had intermittent bladder infections that her kidney doctor has told her about for a few years now. Has been following closely with Dr. Theador Hawthorne now for proteinuria, CKD and calcium levels with hyperparathyroidism.   She has not had many localizing symptoms of cystitis. Largely the symptoms she has seen come up is foam in the urine --> has been following for proteinuria and feels that this waxes and wanes but has been working on increasing water intake as nephrology team has recommended. Previously with bladder spasms on anti-spasmodic but this is better now. Does have a lot of nocturia (sometimes 4x a night).   Has had a few doses of fosfomycin on 2 separate occasions to treat the pseudomonas. No noticeable effect after taking this. Denies fevers, chills, dysuria, frequency, flank pain. Does have some lower abdominal crampy pain but she also takes hycosamine for bowel symptoms too so not sure if bladder or bowel noise related.   Multiple drug allergies including several antibiotics with rash and diarrhea being listed reactions including Ciprofloxacin.  Cipro allergy - had a pretty sudden onset rash with a few doses, localized to the arms. It is very itchy and raised and warm. This was done in 2017    Review of Systems  All other systems reviewed and are negative.    Past Medical History:  Diagnosis Date   Anemia    Anxiety    Chronic rhinitis    evaluated by dr y. Maudie Mercury ([facility]) note in epic 11-24-2019   Depression    DOE (dyspnea on exertion) 04-05-2020  per pt occasional gets sob sitting, lying, getting up and down, and with exertion   04-05-2020 pt pcp sent to cardiologist,  dr Stanford Breed for evalution, note in epic 03-28-2020 (ordered echo and cardiac CT and  they have been scheduled   Endometrial polyp    First degree heart block    Headache    History of adenomatous polyp of colon    History of kidney stones    History of palpitations    previous had event monitor 2017 showed SR/ first degree heart block, no arrhythmia's and normal echo   History of seizure    ED visit 08-22-2014 in epic for possible seizure, per ed note adverse reaction to prednisone stimulant    Hyperlipidemia    Hypertension    followed by pcp   and cardiology (nuclear stress test 01-11- 2018 in epic, normal perfusion no ischemia with normal LV function and wall motion, nuclear ef 74%)   IBS (irritable bowel syndrome)    Nephrolithiasis    04-05-2020 per pt bilateral nonobstructive   OSA on CPAP    study in epic 08-31-1998 mild osa, recommended cpap   PMB (postmenopausal bleeding)    Refusal of blood transfusions as patient is Jehovah's Witness    SUI (stress urinary incontinence, female)     Outpatient Medications Prior to Visit  Medication Sig Dispense Refill   acetaminophen (TYLENOL) 325 MG tablet Take 650 mg by mouth every 6 (six) hours as needed for mild pain or moderate pain.      ALPRAZolam (XANAX) 0.5 MG tablet Take 0.5-1 tablets (0.25-0.5 mg total) by mouth daily as needed for anxiety. May take extra tablet during the day prn  panic. 45 tablet 5   amLODipine (NORVASC) 5 MG tablet Take 1 tablet (5 mg total) by mouth daily. 90 tablet 3   diphenhydrAMINE (BENADRYL) 25 mg capsule Take by mouth.     famotidine (PEPCID) 20 MG tablet Take one tablet ('20mg'$ ) by mouth twice a day 180 tablet 3   hyoscyamine (LEVSIN SL) 0.125 MG SL tablet DISSOLVE 1 TABLET UNDER THE TONGUE EVERY 6 HOURS AS NEEDED 30 tablet 0   Insulin Pen Needle 32G X 6 MM MISC UAD with saxenda 100 each 3   Liraglutide -Weight Management (SAXENDA) 18 MG/3ML SOPN Inject 3 mg into the skin daily. 45 mL 3   losartan (COZAAR) 100 MG tablet Take 1 tablet (100 mg total) by mouth daily. 90 tablet 3    methocarbamol (ROBAXIN) 500 MG tablet Take 1 tablet (500 mg total) by mouth every 8 (eight) hours as needed for muscle spasms. 60 tablet 0   busPIRone (BUSPAR) 10 MG tablet Take 2 tablets (20 mg total) by mouth 2 (two) times daily. FOR ANXIETY 360 tablet 3   carvedilol (COREG) 6.25 MG tablet TAKE 1 TABLET TWICE A DAY 180 tablet 3   rosuvastatin (CRESTOR) 20 MG tablet TAKE 1 TABLET DAILY 90 tablet 3   VIIBRYD 40 MG TABS Take 1 tablet (40 mg total) by mouth daily. 90 tablet 3   No facility-administered medications prior to visit.     Allergies  Allergen Reactions   Ciprofloxacin Rash   Clindamycin Rash   Influenza Vaccines Rash   Latex Rash   Other     PER PT JEHOVAH WITNESS, BLOOD PRODUCT REFUSAL   Temazepam Rash   Enablex [Darifenacin Hydrobromide Er] Swelling    Causes swelling of patient's tongue   Hydroxyzine Hcl     Per pt was given prior to scheduled tonsillectomy in 1975 and had convulsions under anesthesia   Prednisolone Other (See Comments)    Convulsions per pt   Amoxicillin Diarrhea   Oxycodone Rash    percocet   Pantoprazole Rash   Pneumococcal Vaccine Swelling and Rash    Lymph node swelling   Sulfa Antibiotics Rash   Tape Rash    Bandages, etc.    Social History   Tobacco Use   Smoking status: Never   Smokeless tobacco: Never  Vaping Use   Vaping Use: Never used  Substance Use Topics   Alcohol use: Not Currently    Comment: Rare   Drug use: Never    Family History  Problem Relation Age of Onset   Cancer Mother    Aneurysm Mother    Allergic rhinitis Mother    Breast cancer Mother 15   Atrial fibrillation Mother    Cancer Father    Hypertension Father    Alzheimer's disease Father    CAD Father    Allergic rhinitis Brother    COPD Maternal Aunt    Eczema Son    Bronchitis Son    Sinusitis Son    Asthma Neg Hx    Urticaria Neg Hx    Immunodeficiency Neg Hx    Atopy Neg Hx    Angioedema Neg Hx     Objective:   Vitals:   07/23/21 1515   BP: 113/78  Pulse: 80  Temp: 98 F (36.7 C)  Weight: 252 lb (114.3 kg)  Height: '5\' 4"'$  (1.626 m)   Body mass index is 43.26 kg/m.  Physical Exam Vitals reviewed.  Constitutional:      Appearance: Normal appearance. She  is not ill-appearing.  HENT:     Mouth/Throat:     Mouth: Mucous membranes are moist.     Pharynx: Oropharynx is clear.  Eyes:     General: No scleral icterus. Pulmonary:     Effort: Pulmonary effort is normal.  Neurological:     Mental Status: She is oriented to person, place, and time.  Psychiatric:        Mood and Affect: Mood normal.        Thought Content: Thought content normal.     Lab Results: Lab Results  Component Value Date   WBC 6.6 11/07/2019   HGB 15.0 04/09/2020   HCT 44.0 04/09/2020   MCV 88 11/07/2019   PLT 275 11/07/2019    Lab Results  Component Value Date   CREATININE 1.15 (H) 04/01/2021   BUN 13 04/01/2021   NA 139 04/01/2021   K 4.4 04/01/2021   CL 103 04/01/2021   CO2 22 04/01/2021    Lab Results  Component Value Date   ALT 33 (H) 04/01/2021   AST 32 04/01/2021   ALKPHOS 73 04/01/2021   BILITOT 0.5 04/01/2021     Assessment & Plan:   Problem List Items Addressed This Visit       Unprioritized   Asymptomatic bacteriuria - Primary    Very nice female patient here with history of CKD in care with [facility] in [place]. She is not diabetic. Has a history of kidney stones s/p retrievals last in 2017. Her husband mentions that shortly after this procedure she had a high fever and needed hospitalization and IV antibiotics for a period of time. Presume this may have been an inoculation event for her.   She has asymptomatic bacteriuria - we spent time discussing this and the difference between this and true urinary tract infection.  Fortunately she has not had any specific symptoms that are concerning for urinary tract infection. She did not go to ER for IV antibiotics on 6/28 due to high  volumes and the fact she was feeling well. Since then she has felt the same.   Spent time discussing today nature of pseudmonas and how it can become inherantly difficult to treat with antibiotics if they are over-used.  Would recommend against routine culturing of urine without symptoms.         Pseudomonas aeruginosa colonization    Has grown out of urine several times dating back to 2021. She was given 2 courses of fosfomycin for positive pseudomonas urine culture recently; did not notice any changes with it as she had no symptoms at the time. While there can be some activity against pseudomonas I would not rely on that drug to be durably active long term and would not recommend using it if she were to become symptomatic and require treatment.   If she develops symptoms - could re-challenge with levaquin once daily x 5d with symptomatic care of rash if one evolves and close monitoring. I don't have full susceptibilities from available records, but presume either IV cefepime would be other option - fortunately she has tolerated cephalexin well in the past. She would probably tolerate zosyn fine also since amoxicillin is low grade s/e of diarrhea.         Janene Madeira, MSN, NP-C [facility] [place] Pager: [phone] Office: [phone]  07/30/21  8:01 AM

## 2021-07-23 NOTE — Assessment & Plan Note (Signed)
Has grown out of urine several times dating back to 2021. She was given 2 courses of fosfomycin for positive pseudomonas urine culture recently; did not notice any changes with it as she had no symptoms at the time. While there can be some activity against pseudomonas I would not rely on that drug to be durably active long term and would not recommend using it if she were to become symptomatic and require treatment.   If she develops symptoms - could re-challenge with levaquin once daily x 5d with symptomatic care of rash if one evolves and close monitoring. I don't have full susceptibilities from available records, but presume either IV cefepime would be other option - fortunately she has tolerated cephalexin well in the past. She would probably tolerate zosyn fine also since amoxicillin is low grade s/e of diarrhea.

## 2021-07-23 NOTE — Patient Instructions (Signed)
Lovely to meet you both -   Looking forward to checking in with you by phone or video (your preference) in 2 weeks to make sure no changes.   Asymptomatic Bacteriuria Asymptomatic bacteriuria is the presence of a large number of bacteria in the urine without the usual symptoms of burning or frequent urination. This is usually not harmful, and treatment may not be needed. A person with this condition will not be more likely to develop an infection in the future. What are the causes? This condition is caused by an increase in bacteria in the urine. This increase can be caused by: Bacteria entering the urinary tract, such as during sex. A blockage in the urinary tract, such as from kidney stones or a tumor. Bladder problems that prevent the bladder from emptying. What increases the risk? You are more likely to develop this condition if: You have diabetes. You are an older adult. This especially affects older adults in long-term care facilities. You are pregnant and in the first trimester. You have kidney stones. You are female. You have had a kidney transplant. You have a leaky kidney tube valve (reflux). You had a urinary catheter for a long period of time. This is a long, thin tube that collects urine. What are the signs or symptoms? There are no symptoms of this condition. How is this diagnosed? This condition is diagnosed with a urine test. Because this condition does not cause symptoms, it is usually diagnosed when a urine sample is taken to treat or diagnose another condition, such as pregnancy or kidney problems. Most women who are in their first trimester of pregnancy are screened for asymptomatic bacteriuria. How is this treated? Usually, treatment is not needed for this condition. Treating the condition can lead to other problems, such as a yeast infection or the growth of bacteria that do not respond to treatment (antibiotic-resistant bacteria). Some people do need treatment with  antibiotic medicines to prevent kidney infection, known as pyelonephritis. Treatment is needed if: You are pregnant. In pregnant women, kidney infection can lead to: Early labor (premature labor). Very low birth weight (fetal growth restriction). Newborn death. You are having a procedure that affects the urinary tract. You have had a kidney transplant. If you are diagnosed with this condition, talk with your health care provider about any concerns that you have. Follow these instructions at home: Medicines Take over-the-counter and prescription medicines only as told by your health care provider. If you were prescribed an antibiotic medicine, take it as told by your health care provider. Do not stop using the antibiotic even if you start to feel better. General instructions Monitor your condition for any changes. Drink enough fluid to keep your urine pale yellow. Urinate more often to keep your bladder empty. If you are female, keep the area around your vagina and rectum clean. Wipe from front to back after urinating or having a bowel movement. Use each piece of toilet paper only once. Keep all follow-up visits. This is important. Contact a health care provider if: You have symptoms of a urine infection, such as: A burning sensation, or pain when you urinate. A strong need to urinate, or urinating more often. Urine turning discolored or cloudy. Blood in your urine. Urine that smells bad. Get help right away if: You develop signs of a kidney infection, such as: Back pain or pelvic pain. A fever or chills. Nausea or vomiting. Severe pain that cannot be controlled with medicine. Summary Asymptomatic bacteriuria is the presence  of a large number of bacteria in the urine without the usual symptoms of burning or frequent urination. Usually, treatment is not needed for this condition. Treating the condition can lead to other problems, such as a yeast infection or the growth of bacteria that  do not respond to treatment. Some people do need treatment. Treatment is needed if you are pregnant, if you are having a procedure that affects the urinary tract, or if you have had a kidney transplant. If you were prescribed an antibiotic medicine, take it as told by your health care provider. Do not stop using the antibiotic even if you start to feel better. This information is not intended to replace advice given to you by your health care provider. Make sure you discuss any questions you have with your health care provider. Document Revised: 08/12/2019 Document Reviewed: 08/12/2019 Elsevier Patient Education  [place].

## 2021-07-23 NOTE — Assessment & Plan Note (Signed)
Very nice female patient here with history of CKD in care with [facility] in [place]. She is not diabetic. Has a history of kidney stones s/p retrievals last in 2017. Her husband mentions that shortly after this procedure she had a high fever and needed hospitalization and IV antibiotics for a period of time. Presume this may have been an inoculation event for her.   She has asymptomatic bacteriuria - we spent time discussing this and the difference between this and true urinary tract infection.  Fortunately she has not had any specific symptoms that are concerning for urinary tract infection. She did not go to ER for IV antibiotics on 6/28 due to high volumes and the fact she was feeling well. Since then she has felt the same.   Spent time discussing today nature of pseudmonas and how it can become inherantly difficult to treat with antibiotics if they are over-used.  Would recommend against routine culturing of urine without symptoms.

## 2021-07-24 ENCOUNTER — Telehealth: Payer: Self-pay

## 2021-07-24 NOTE — Telephone Encounter (Signed)
-----   Message from [place] Callas, NP sent at 07/23/2021  5:15 PM EDT ----- Can we fax note to Dr. Theador Hawthorne  [phone] Thanks!

## 2021-07-24 NOTE — Telephone Encounter (Signed)
Note routed via Epic to Dr. Theador Hawthorne.   Beryle Flock, RN

## 2021-07-25 ENCOUNTER — Encounter: Payer: Self-pay | Admitting: Cardiology

## 2021-07-25 ENCOUNTER — Encounter: Payer: Self-pay | Admitting: Family Medicine

## 2021-07-25 ENCOUNTER — Other Ambulatory Visit: Payer: Self-pay

## 2021-07-25 ENCOUNTER — Other Ambulatory Visit: Payer: Self-pay | Admitting: Family Medicine

## 2021-07-25 DIAGNOSIS — F411 Generalized anxiety disorder: Secondary | ICD-10-CM

## 2021-07-25 MED ORDER — BUSPIRONE HCL 10 MG PO TABS: 20.0000 mg | ORAL_TABLET | Freq: Two times a day (BID) | ORAL | 3 refills | Status: DC

## 2021-07-25 MED ORDER — VIIBRYD 40 MG PO TABS: 40.0000 mg | ORAL_TABLET | Freq: Every day | ORAL | 3 refills | Status: DC

## 2021-07-25 MED ORDER — ROSUVASTATIN CALCIUM 20 MG PO TABS: 20.0000 mg | ORAL_TABLET | Freq: Every day | ORAL | 3 refills | Status: DC

## 2021-07-25 MED ORDER — CARVEDILOL 6.25 MG PO TABS: 6.2500 mg | ORAL_TABLET | Freq: Two times a day (BID) | ORAL | 3 refills | Status: DC

## 2021-07-25 NOTE — Telephone Encounter (Signed)
Changes Requested   traZODone (DESYREL) 150 MG tablet       Changed from: VIIBRYD 40 MG TABS     Pharmacy comment: Alternative Requested:BRAND NOT COVERED; PLEASE CONTACT INSURANCE OR CONSIDER SENDING FOR GENERIC OR ALTERNATIVE.   All Pharmacy Suggested Alternatives:   traZODone (DESYREL) 150 MG tablet Vilazodone HCl (VIIBRYD) 40 MG TABS

## 2021-07-26 ENCOUNTER — Other Ambulatory Visit: Payer: Self-pay | Admitting: Family Medicine

## 2021-07-26 DIAGNOSIS — F411 Generalized anxiety disorder: Secondary | ICD-10-CM

## 2021-07-26 NOTE — Telephone Encounter (Signed)
Trazodone is NOT an appropriate alternative for Viibryd.  She's been on this for years. NO change.

## 2021-07-31 ENCOUNTER — Encounter: Payer: Self-pay | Admitting: Family Medicine

## 2021-07-31 ENCOUNTER — Ambulatory Visit (INDEPENDENT_AMBULATORY_CARE_PROVIDER_SITE_OTHER): Payer: 59 | Admitting: Family Medicine

## 2021-07-31 ENCOUNTER — Other Ambulatory Visit: Payer: Self-pay | Admitting: Family Medicine

## 2021-07-31 VITALS — BP 109/73 | HR 92 | Temp 97.2°F | Ht 64.0 in | Wt 250.4 lb

## 2021-07-31 DIAGNOSIS — Z79899 Other long term (current) drug therapy: Secondary | ICD-10-CM

## 2021-07-31 DIAGNOSIS — M549 Dorsalgia, unspecified: Secondary | ICD-10-CM

## 2021-07-31 DIAGNOSIS — I10 Essential (primary) hypertension: Secondary | ICD-10-CM | POA: Diagnosis not present

## 2021-07-31 DIAGNOSIS — R69 Illness, unspecified: Secondary | ICD-10-CM | POA: Diagnosis not present

## 2021-07-31 DIAGNOSIS — F5104 Psychophysiologic insomnia: Secondary | ICD-10-CM | POA: Diagnosis not present

## 2021-07-31 DIAGNOSIS — F411 Generalized anxiety disorder: Secondary | ICD-10-CM

## 2021-07-31 MED ORDER — ALPRAZOLAM 0.5 MG PO TABS: 0.2500 mg | ORAL_TABLET | Freq: Every day | ORAL | 5 refills | Status: DC | PRN

## 2021-07-31 MED ORDER — WEGOVY 2.4 MG/0.75ML ~~LOC~~ SOAJ: 2.4000 mg | SUBCUTANEOUS | 3 refills | Status: DC

## 2021-07-31 MED ORDER — WEGOVY 1.7 MG/0.75ML ~~LOC~~ SOAJ: 1.7000 mg | SUBCUTANEOUS | 0 refills | Status: DC

## 2021-07-31 NOTE — Progress Notes (Signed)
Subjective: CC: Med refills PCP: [name], DO EGB:TDVVO L Cothran is a 66 y.o. female presenting to clinic today for:  1.  Anxiety disorder Patient reports stability of anxiety disorder with Viibryd, BuSpar and as needed Xanax.  No reports of excessive daytime sedation, mental status changes with the alprazolam but she does need a refill  2.  Morbid obesity associated with hypertension, hyperlipidemia and CKD 3A Patient is currently treated with Saxenda and is up to the 3 mg daily dosing.  Patient weighed 288 pounds when we started this medication and is down to 250 pounds.  She does feel like she has plateaued and would like to transition over to [facility] for ease of possible.  Does not report any abdominal pain, nausea, vomiting.  She had renal checkup recently with nephrology and renal function was stable at CKD 3A.  She admits that she still has some issues with food selection but overall has had positive impact on her dietary choices and ability to be more mobile with the medication and weight loss.   ROS: Per HPI  Allergies  Allergen Reactions   Ciprofloxacin Rash   Clindamycin Rash   Influenza Vaccines Rash   Latex Rash   Other     PER PT JEHOVAH WITNESS, BLOOD PRODUCT REFUSAL   Temazepam Rash   Enablex [Darifenacin Hydrobromide Er] Swelling    Causes swelling of patient's tongue   Hydroxyzine Hcl     Per pt was given prior to scheduled tonsillectomy in 1975 and had convulsions under anesthesia   Prednisolone Other (See Comments)    Convulsions per pt   Amoxicillin Diarrhea   Oxycodone Rash    percocet   Pantoprazole Rash   Pneumococcal Vaccine Swelling and Rash    Lymph node swelling   Sulfa Antibiotics Rash   Tape Rash    Bandages, etc.   Past Medical History:  Diagnosis Date   Anemia    Anxiety    Chronic rhinitis    evaluated by dr y. Maudie Mercury ([facility]) note in epic 11-24-2019   Depression    DOE (dyspnea on exertion) 04-05-2020  per pt  occasional gets sob sitting, lying, getting up and down, and with exertion   04-05-2020 pt pcp sent to cardiologist,  dr Stanford Breed for evalution, note in epic 03-28-2020 (ordered echo and cardiac CT and they have been scheduled   Endometrial polyp    First degree heart block    Headache    History of adenomatous polyp of colon    History of kidney stones    History of palpitations    previous had event monitor 2017 showed SR/ first degree heart block, no arrhythmia's and normal echo   History of seizure    ED visit 08-22-2014 in epic for possible seizure, per ed note adverse reaction to prednisone stimulant    Hyperlipidemia    Hypertension    followed by pcp   and cardiology (nuclear stress test 01-11- 2018 in epic, normal perfusion no ischemia with normal LV function and wall motion, nuclear ef 74%)   IBS (irritable bowel syndrome)    Nephrolithiasis    04-05-2020 per pt bilateral nonobstructive   OSA on CPAP    study in epic 08-31-1998 mild osa, recommended cpap   PMB (postmenopausal bleeding)    Refusal of blood transfusions as patient is Jehovah's Witness    SUI (stress urinary incontinence, female)     Current Outpatient Medications:    acetaminophen (TYLENOL)  325 MG tablet, Take 650 mg by mouth every 6 (six) hours as needed for mild pain or moderate pain. , Disp: , Rfl:    ALPRAZolam (XANAX) 0.5 MG tablet, Take 0.5-1 tablets (0.25-0.5 mg total) by mouth daily as needed for anxiety. May take extra tablet during the day prn panic., Disp: 45 tablet, Rfl: 5   amLODipine (NORVASC) 5 MG tablet, Take 1 tablet (5 mg total) by mouth daily., Disp: 90 tablet, Rfl: 3   busPIRone (BUSPAR) 10 MG tablet, Take 2 tablets (20 mg total) by mouth 2 (two) times daily. FOR ANXIETY, Disp: 360 tablet, Rfl: 3   carvedilol (COREG) 6.25 MG tablet, Take 1 tablet (6.25 mg total) by mouth 2 (two) times daily., Disp: 180 tablet, Rfl: 3   diphenhydrAMINE (BENADRYL) 25 mg capsule, Take by mouth., Disp: , Rfl:     famotidine (PEPCID) 20 MG tablet, Take one tablet ('20mg'$ ) by mouth twice a day, Disp: 180 tablet, Rfl: 3   hyoscyamine (LEVSIN SL) 0.125 MG SL tablet, DISSOLVE 1 TABLET UNDER THE TONGUE EVERY 6 HOURS AS NEEDED, Disp: 30 tablet, Rfl: 0   Insulin Pen Needle 32G X 6 MM MISC, UAD with saxenda, Disp: 100 each, Rfl: 3   Liraglutide -Weight Management (SAXENDA) 18 MG/3ML SOPN, Inject 3 mg into the skin daily., Disp: 45 mL, Rfl: 3   losartan (COZAAR) 100 MG tablet, Take 1 tablet (100 mg total) by mouth daily., Disp: 90 tablet, Rfl: 3   methocarbamol (ROBAXIN) 500 MG tablet, Take 1 tablet (500 mg total) by mouth every 8 (eight) hours as needed for muscle spasms., Disp: 60 tablet, Rfl: 0   rosuvastatin (CRESTOR) 20 MG tablet, Take 1 tablet (20 mg total) by mouth daily., Disp: 90 tablet, Rfl: 3   VIIBRYD 40 MG TABS, Take 1 tablet (40 mg total) by mouth daily., Disp: 90 tablet, Rfl: 3 Social History   Socioeconomic History   Marital status: Married    Spouse name: Not on file   Number of children: 1   Years of education: Not on file   Highest education level: Not on file  Occupational History   Not on file  Tobacco Use   Smoking status: Never   Smokeless tobacco: Never  Vaping Use   Vaping Use: Never used  Substance and Sexual Activity   Alcohol use: Not Currently    Comment: Rare   Drug use: Never   Sexual activity: Not on file  Other Topics Concern   Not on file  Social History Narrative   Not on file   Social Determinants of Health   Financial Resource Strain: Not on file  Food Insecurity: Not on file  Transportation Needs: Not on file  Physical Activity: Not on file  Stress: Not on file  Social Connections: Not on file  Intimate Partner Violence: Not on file   Family History  Problem Relation Age of Onset   Cancer Mother    Aneurysm Mother    Allergic rhinitis Mother    Breast cancer Mother 56   Atrial fibrillation Mother    Cancer Father    Hypertension Father     Alzheimer's disease Father    CAD Father    Allergic rhinitis Brother    COPD Maternal Aunt    Eczema Son    Bronchitis Son    Sinusitis Son    Asthma Neg Hx    Urticaria Neg Hx    Immunodeficiency Neg Hx    Atopy Neg Hx  Angioedema Neg Hx     Objective: Office vital signs reviewed. BP 109/73   Pulse 92   Temp (!) 97.2 F (36.2 C)   Ht '5\' 4"'$  (1.626 m)   Wt 250 lb 6.4 oz (113.6 kg)   SpO2 93%   BMI 42.98 kg/m   Physical Examination:  General: Awake, alert, morbidly obese, No acute distress HEENT: Sclera white. Cardio: regular rate and rhythm, S1S2 heard, no murmurs appreciated Pulm: clear to auscultation bilaterally, no wheezes, rhonchi or rales; normal work of breathing on room air Psych: Mood stable, speech normal, affect appropriate  Assessment/ Plan: 66 y.o. female   Generalized anxiety disorder - Plan: ToxASSURE Select 13 (MW), Urine, ALPRAZolam (XANAX) 0.5 MG tablet  Psychophysiological insomnia - Plan: ToxASSURE Select 13 (MW), Urine, ALPRAZolam (XANAX) 0.5 MG tablet  Controlled substance agreement signed - Plan: ToxASSURE Select 13 (MW), Urine  Morbid obesity ([place]) - Plan: Semaglutide-Weight Management (WEGOVY) 1.7 MG/0.75ML SOAJ, Semaglutide-Weight Management (WEGOVY) 2.4 MG/0.75ML SOAJ  Musculoskeletal back pain - Plan: Semaglutide-Weight Management (WEGOVY) 1.7 MG/0.75ML SOAJ, Semaglutide-Weight Management (WEGOVY) 2.4 MG/0.75ML SOAJ  Essential hypertension - Plan: Semaglutide-Weight Management (WEGOVY) 1.7 MG/0.75ML SOAJ, Semaglutide-Weight Management (WEGOVY) 2.4 MG/0.75ML SOAJ  Anxiety disorder is chronic and stable.  UDS and CSC were updated as per office policy.  National narcotic database reviewed and there were no red flags.  She had an excellent response to the [facility] with over 38 pound weight loss.  We will plan for transition over to 1.7 mg of Wegovy weekly.  She may advance to 2.4 mg of the [facility] after 4 weeks.  I like to see her back in 3  weeks for interval checkup  Continue to monitor blood pressures closely.  She is somewhat borderline today.  May need to consider discontinuing her amlodipine if her blood pressure continues to decline or if she becomes symptomatic  No orders of the defined types were placed in this encounter.  No orders of the defined types were placed in this encounter.    [name], DO [place] [phone]

## 2021-07-31 NOTE — Telephone Encounter (Signed)
Will you please check and see if the Sherri Howard is truly not covered or if this requires a prior authorization.  Her [employer]ke Shaggy has been covered by her insurance.  We are trying to transition her over to [facility] for ease

## 2021-08-01 ENCOUNTER — Telehealth: Payer: Self-pay | Admitting: Family Medicine

## 2021-08-01 NOTE — Telephone Encounter (Signed)
Calling about  Liraglutide -Weight Management (SAXENDA) 18 MG/3ML SOPN  Reference key [id number]

## 2021-08-01 NOTE — Telephone Encounter (Signed)
Patient returning call.

## 2021-08-01 NOTE — Telephone Encounter (Signed)
Patient calling because she took her first [place] shot and wants to make sure that she did it correctly. Please call back.

## 2021-08-05 LAB — TOXASSURE SELECT 13 (MW), URINE

## 2021-08-06 ENCOUNTER — Ambulatory Visit (INDEPENDENT_AMBULATORY_CARE_PROVIDER_SITE_OTHER): Payer: 59 | Admitting: Infectious Diseases

## 2021-08-06 ENCOUNTER — Other Ambulatory Visit: Payer: Self-pay

## 2021-08-06 DIAGNOSIS — Z2239 Carrier of other specified bacterial diseases: Secondary | ICD-10-CM

## 2021-08-06 NOTE — Telephone Encounter (Signed)
I believe the initial request through Covermymeds had incorrect insurance info. Covermymeds stated that pt is inactive. New PA initiated with [employer] card info.  (Key: [id number])  Your demographic data has been sent to [facility] Medicare Part D. They will respond shortly with your clinical questions. You can check for an update later by opening this request from your dashboard. Please do not fax or call [employer] Medicare Part D to resubmit this request.  If you need assistance, please chat with CoverMyMeds or call us at [phone].

## 2021-08-06 NOTE — Assessment & Plan Note (Signed)
Sherri Howard continues to do well over the last 2 weeks without any symptoms to indicate urinary tract infection symptoms. Encouraged her to continue with UTI prevention measures we discussed previously. She is open to trial of Levaquin should she need treatment.  For uncomplicated cystitis can treat with 5 days of Levaquin 250 mg Q24h. If any flank pain or systemic symptoms to suggest acute pyelo would do 750 mg dose 10 d vs ER visit for IV antibiotics if unable to hold down fluids with IV cefepime or ceftazidime.   Will have her continue to monitor for any urinary symptoms and call back PRN to either use or nephrology team to seek treatment.

## 2021-08-06 NOTE — Progress Notes (Signed)
Virtual Visit via Telephone Note  I connected with Sherri Howard on 08/06/21 at  2:30 PM EDT by telephone and verified that I am speaking with the correct person using two identifiers.   Location: Patient: [facility] [place] [facility]  Provider: [facility]    I discussed the limitations, risks, security and privacy concerns of performing an evaluation and management service by telephone and the availability of in person appointments. I also discussed with the patient that there may be a patient responsible charge related to this service. The patient expressed understanding and agreed to proceed.   History of Present Illness: 2 week follow up to discus any development in urinary symptoms.   She tells me that she has been doing well since our las OV 2 weeks ago. Continues to have a little difficulty with complete urinary emptying but no other progressive symptoms. No fevers/chills. No flank/lower abdominal pains. She has not done as good as she hoped with increasing her water intake but still continues to keep this in forefront.     Observations/Objective: Well coordinated speech. Pleasant on the phone. No distress.   Assessment and Plan: Problem List Items Addressed This Visit       Unprioritized   Pseudomonas aeruginosa colonization    Sherri Howard continues to do well over the last 2 weeks without any symptoms to indicate urinary tract infection symptoms. Encouraged her to continue with UTI prevention measures we discussed previously. She is open to trial of Levaquin should she need treatment.  For uncomplicated cystitis can treat with 5 days of Levaquin 250 mg Q24h. If any flank pain or systemic symptoms to suggest acute pyelo would do 750 mg dose 10 d vs ER visit for IV antibiotics if unable to hold down fluids with IV cefepime or ceftazidime.   Will have her continue to monitor for any urinary symptoms and call back PRN to either use or nephrology team to seek treatment.         Follow Up  Instructions: PRN    I discussed the assessment and treatment plan with the patient. The patient was provided an opportunity to ask questions and all were answered. The patient agreed with the plan and demonstrated an understanding of the instructions.   The patient was advised to call back or seek an in-person evaluation if the symptoms worsen or if the condition fails to improve as anticipated.  I provided 12 minutes of non-face-to-face time during this encounter.   Janene Madeira, NP

## 2021-08-07 NOTE — Telephone Encounter (Signed)
Please let Jaysha know her ins will not cover wegovy. Recommend sticking with the saxenda

## 2021-08-09 NOTE — Telephone Encounter (Signed)
Your PA has been faxed to the plan as a paper copy. Please contact the plan directly if you haven't received a determination in a typical timeframe.  You will be notified of the determination electronically and via fax. How do I know if the plan approved the PA?  Add Reminder to your Dashboard Remind me in:  5 business days Contact plan to follow up on [place]

## 2021-08-18 ENCOUNTER — Other Ambulatory Visit: Payer: Self-pay | Admitting: Family Medicine

## 2021-08-18 DIAGNOSIS — F411 Generalized anxiety disorder: Secondary | ICD-10-CM

## 2021-08-22 ENCOUNTER — Encounter: Payer: Self-pay | Admitting: Family Medicine

## 2021-08-28 ENCOUNTER — Ambulatory Visit: Payer: 59 | Admitting: "Endocrinology

## 2021-09-02 ENCOUNTER — Other Ambulatory Visit: Payer: Self-pay | Admitting: *Deleted

## 2021-09-02 DIAGNOSIS — F411 Generalized anxiety disorder: Secondary | ICD-10-CM

## 2021-09-02 MED ORDER — VIIBRYD 40 MG PO TABS: 40.0000 mg | ORAL_TABLET | Freq: Every day | ORAL | 2 refills | Status: DC

## 2021-09-02 MED ORDER — BUSPIRONE HCL 10 MG PO TABS: 20.0000 mg | ORAL_TABLET | Freq: Two times a day (BID) | ORAL | 2 refills | Status: DC

## 2021-09-04 ENCOUNTER — Other Ambulatory Visit: Payer: Self-pay | Admitting: Family Medicine

## 2021-09-04 DIAGNOSIS — F411 Generalized anxiety disorder: Secondary | ICD-10-CM

## 2021-09-05 ENCOUNTER — Ambulatory Visit (INDEPENDENT_AMBULATORY_CARE_PROVIDER_SITE_OTHER): Payer: 59 | Admitting: Family Medicine

## 2021-09-05 ENCOUNTER — Encounter: Payer: Self-pay | Admitting: Family Medicine

## 2021-09-05 VITALS — BP 101/71 | HR 90 | Temp 97.3°F | Ht 64.0 in | Wt 244.0 lb

## 2021-09-05 DIAGNOSIS — F411 Generalized anxiety disorder: Secondary | ICD-10-CM | POA: Diagnosis not present

## 2021-09-05 DIAGNOSIS — R69 Illness, unspecified: Secondary | ICD-10-CM | POA: Diagnosis not present

## 2021-09-05 DIAGNOSIS — R1905 Periumbilic swelling, mass or lump: Secondary | ICD-10-CM

## 2021-09-05 MED ORDER — VILAZODONE HCL 40 MG PO TABS: 40.0000 mg | ORAL_TABLET | Freq: Every day | ORAL | 2 refills | Status: DC

## 2021-09-05 NOTE — Progress Notes (Signed)
Assessment & Plan:  1. Periumbilical mass - CT Abdomen Pelvis W Contrast; Future   Follow up plan: Return if symptoms worsen or fail to improve.  Hendricks Limes, MSN, APRN, FNP-C [facility]  Subjective:   [name], female    DOB: [birth year], 66 y.o.   MRN: [id number]  HPI: Sherri Howard is a 66 y.o. female presenting on 09/05/2021 for Hernia (Abdominal hernia that has been there for years but is now feels like it is moving and getting painful.  )  Patient is accompanied by her significant other who she is okay with being present.  Patient reports an abdominal hernia that has been present for years.  She feels it is now protruding more and is getting painful.  She also feels like it moves as it is sometimes above her umbilicus and sometimes more on the right side of her abdomen.  She has a history of a hernia surgery below the present concern back in 2016.   ROS: Negative unless specifically indicated above in HPI.   Relevant past medical history reviewed and updated as indicated.   Allergies and medications reviewed and updated.   Current Outpatient Medications:    acetaminophen (TYLENOL) 325 MG tablet, Take 650 mg by mouth every 6 (six) hours as needed for mild pain or moderate pain. , Disp: , Rfl:    ALPRAZolam (XANAX) 0.5 MG tablet, Take 0.5-1 tablets (0.25-0.5 mg total) by mouth daily as needed for anxiety. May take extra tablet during the day prn panic., Disp: 45 tablet, Rfl: 5   amLODipine (NORVASC) 5 MG tablet, Take 1 tablet (5 mg total) by mouth daily., Disp: 90 tablet, Rfl: 3   busPIRone (BUSPAR) 10 MG tablet, Take 2 tablets (20 mg total) by mouth 2 (two) times daily. FOR ANXIETY, Disp: 360 tablet, Rfl: 2   carvedilol (COREG) 6.25 MG tablet, Take 1 tablet (6.25 mg total) by mouth 2 (two) times daily., Disp: 180 tablet, Rfl: 3   diphenhydrAMINE (BENADRYL) 25 mg capsule, Take by mouth., Disp: , Rfl:    famotidine (PEPCID) 20 MG  tablet, Take one tablet ('20mg'$ ) by mouth twice a day, Disp: 180 tablet, Rfl: 3   hyoscyamine (LEVSIN SL) 0.125 MG SL tablet, DISSOLVE 1 TABLET UNDER THE TONGUE EVERY 6 HOURS AS NEEDED, Disp: 30 tablet, Rfl: 0   Insulin Pen Needle 32G X 6 MM MISC, UAD with saxenda, Disp: 100 each, Rfl: 3   Liraglutide -Weight Management (SAXENDA) 18 MG/3ML SOPN, Inject 3 mg into the skin daily., Disp: 45 mL, Rfl: 3   losartan (COZAAR) 100 MG tablet, Take 1 tablet (100 mg total) by mouth daily., Disp: 90 tablet, Rfl: 3   methocarbamol (ROBAXIN) 500 MG tablet, Take 1 tablet (500 mg total) by mouth every 8 (eight) hours as needed for muscle spasms., Disp: 60 tablet, Rfl: 0   rosuvastatin (CRESTOR) 20 MG tablet, Take 1 tablet (20 mg total) by mouth daily., Disp: 90 tablet, Rfl: 3   Semaglutide-Weight Management (WEGOVY) 1.7 MG/0.75ML SOAJ, Inject 1.7 mg into the skin every 7 (seven) days. For 1 month then start 2.'4mg'$  next month, Disp: 3 mL, Rfl: 0   Semaglutide-Weight Management (WEGOVY) 2.4 MG/0.75ML SOAJ, Inject 2.4 mg into the skin every 7 (seven) days., Disp: 9 mL, Rfl: 3   VIIBRYD 40 MG TABS, Take 1 tablet (40 mg total) by mouth daily., Disp: 90 tablet, Rfl: 2  Allergies  Allergen Reactions   Ciprofloxacin Rash   Clindamycin  Rash   Influenza Vaccines Rash   Latex Rash   Other     PER PT JEHOVAH WITNESS, BLOOD PRODUCT REFUSAL   Temazepam Rash   Enablex [Darifenacin Hydrobromide Er] Swelling    Causes swelling of patient's tongue   Hydroxyzine Hcl     Per pt was given prior to scheduled tonsillectomy in 1975 and had convulsions under anesthesia   Prednisolone Other (See Comments)    Convulsions per pt   Amoxicillin Diarrhea   Oxycodone Rash    percocet   Pantoprazole Rash   Pneumococcal Vaccine Swelling and Rash    Lymph node swelling   Sulfa Antibiotics Rash   Tape Rash    Bandages, etc.    Objective:   BP 101/71   Pulse 90   Temp (!) 97.3 F (36.3 C) (Temporal)   Ht '5\' 4"'$  (1.626 m)   Wt 244  lb (110.7 kg)   SpO2 96%   BMI 41.88 kg/m    Physical Exam Vitals reviewed.  Constitutional:      General: She is not in acute distress.    Appearance: Normal appearance. She is not ill-appearing, toxic-appearing or diaphoretic.  HENT:     Head: Normocephalic and atraumatic.  Eyes:     General: No scleral icterus.       Right eye: No discharge.        Left eye: No discharge.     Conjunctiva/sclera: Conjunctivae normal.  Cardiovascular:     Rate and Rhythm: Normal rate.  Pulmonary:     Effort: Pulmonary effort is normal. No respiratory distress.  Abdominal:     Palpations: There is mass (above umbilicus that is tender to palpation).  Musculoskeletal:        General: Normal range of motion.     Cervical back: Normal range of motion.  Skin:    General: Skin is warm and dry.     Capillary Refill: Capillary refill takes less than 2 seconds.  Neurological:     General: No focal deficit present.     Mental Status: She is alert and oriented to person, place, and time. Mental status is at baseline.  Psychiatric:        Mood and Affect: Mood normal.        Behavior: Behavior normal.        Thought Content: Thought content normal.        Judgment: Judgment normal.

## 2021-09-05 NOTE — Telephone Encounter (Signed)
traZODone (DESYREL) 150 MG tablet        Changed from: VIIBRYD 40 MG TABS   Pharmacy comment: Your Patients Benefit Plan does not cover the prescribed medication. Please consider an alternative medication (please include drug name/str/directions/QTY/refills) or to obtain PA call [phone].  Your Patients Benefit Plan does not cover the prescribed medication. Please consider an alternative medication (please include drug name/str/directions/QTY/refills) or to obtain PA call [phone].   All Pharmacy Suggested Alternatives:   traZODone (DESYREL) 150 MG tablet Vilazodone HCl (VIIBRYD) 40 MG TABS

## 2021-09-05 NOTE — Patient Instructions (Signed)
Dr. Arnoldo Morale or Dr. Constance Haw in [place].

## 2021-09-06 ENCOUNTER — Encounter (HOSPITAL_BASED_OUTPATIENT_CLINIC_OR_DEPARTMENT_OTHER): Payer: Self-pay

## 2021-09-06 ENCOUNTER — Ambulatory Visit (HOSPITAL_BASED_OUTPATIENT_CLINIC_OR_DEPARTMENT_OTHER): Admission: RE | Admit: 2021-09-06 | Payer: 59 | Source: Ambulatory Visit

## 2021-09-06 ENCOUNTER — Telehealth: Payer: Self-pay | Admitting: Family Medicine

## 2021-09-09 ENCOUNTER — Encounter: Payer: Self-pay | Admitting: Family Medicine

## 2021-09-09 NOTE — Telephone Encounter (Signed)
This is not an appropriate substitution.  No change

## 2021-09-10 ENCOUNTER — Other Ambulatory Visit: Payer: Self-pay

## 2021-09-10 ENCOUNTER — Telehealth (HOSPITAL_BASED_OUTPATIENT_CLINIC_OR_DEPARTMENT_OTHER): Payer: Self-pay

## 2021-09-10 ENCOUNTER — Encounter: Payer: Self-pay | Admitting: Cardiology

## 2021-09-10 ENCOUNTER — Encounter: Payer: Self-pay | Admitting: Family Medicine

## 2021-09-10 DIAGNOSIS — I10 Essential (primary) hypertension: Secondary | ICD-10-CM

## 2021-09-10 MED ORDER — AMLODIPINE BESYLATE 5 MG PO TABS: 5.0000 mg | ORAL_TABLET | Freq: Every day | ORAL | 3 refills | Status: DC

## 2021-09-10 MED ORDER — CARVEDILOL 6.25 MG PO TABS: 6.2500 mg | ORAL_TABLET | Freq: Two times a day (BID) | ORAL | 3 refills | Status: DC

## 2021-09-10 MED ORDER — LOSARTAN POTASSIUM 100 MG PO TABS: 100.0000 mg | ORAL_TABLET | Freq: Every day | ORAL | 3 refills | Status: DC

## 2021-09-10 MED ORDER — ROSUVASTATIN CALCIUM 20 MG PO TABS: 20.0000 mg | ORAL_TABLET | Freq: Every day | ORAL | 3 refills | Status: DC

## 2021-09-11 ENCOUNTER — Ambulatory Visit (HOSPITAL_BASED_OUTPATIENT_CLINIC_OR_DEPARTMENT_OTHER): Payer: 59

## 2021-09-12 ENCOUNTER — Encounter: Payer: Self-pay | Admitting: Family Medicine

## 2021-09-17 ENCOUNTER — Ambulatory Visit (HOSPITAL_COMMUNITY): Payer: 59

## 2021-09-17 ENCOUNTER — Telehealth: Payer: Self-pay | Admitting: Family Medicine

## 2021-09-17 DIAGNOSIS — R1905 Periumbilic swelling, mass or lump: Secondary | ICD-10-CM

## 2021-09-17 DIAGNOSIS — R1900 Intra-abdominal and pelvic swelling, mass and lump, unspecified site: Secondary | ICD-10-CM

## 2021-09-17 DIAGNOSIS — R809 Proteinuria, unspecified: Secondary | ICD-10-CM | POA: Diagnosis not present

## 2021-09-17 DIAGNOSIS — I129 Hypertensive chronic kidney disease with stage 1 through stage 4 chronic kidney disease, or unspecified chronic kidney disease: Secondary | ICD-10-CM | POA: Diagnosis not present

## 2021-09-17 DIAGNOSIS — N3091 Cystitis, unspecified with hematuria: Secondary | ICD-10-CM | POA: Diagnosis not present

## 2021-09-17 DIAGNOSIS — N1831 Chronic kidney disease, stage 3a: Secondary | ICD-10-CM | POA: Diagnosis not present

## 2021-09-17 DIAGNOSIS — E211 Secondary hyperparathyroidism, not elsewhere classified: Secondary | ICD-10-CM | POA: Diagnosis not present

## 2021-09-17 NOTE — Telephone Encounter (Signed)
We received information from this patient's insurance that they have denied the CT Abd and Pelvis Combo, but will approve CPT Code 12548 which is a CT Abd With Contrast. This new order has been placed.

## 2021-09-18 DIAGNOSIS — N200 Calculus of kidney: Secondary | ICD-10-CM | POA: Diagnosis not present

## 2021-09-18 DIAGNOSIS — Z6841 Body Mass Index (BMI) 40.0 and over, adult: Secondary | ICD-10-CM | POA: Diagnosis not present

## 2021-09-18 DIAGNOSIS — N1831 Chronic kidney disease, stage 3a: Secondary | ICD-10-CM | POA: Diagnosis not present

## 2021-09-18 DIAGNOSIS — R82991 Hypocitraturia: Secondary | ICD-10-CM | POA: Diagnosis not present

## 2021-09-18 DIAGNOSIS — I9589 Other hypotension: Secondary | ICD-10-CM | POA: Diagnosis not present

## 2021-09-18 DIAGNOSIS — Z8679 Personal history of other diseases of the circulatory system: Secondary | ICD-10-CM | POA: Diagnosis not present

## 2021-09-18 DIAGNOSIS — E210 Primary hyperparathyroidism: Secondary | ICD-10-CM | POA: Diagnosis not present

## 2021-09-18 DIAGNOSIS — R809 Proteinuria, unspecified: Secondary | ICD-10-CM | POA: Diagnosis not present

## 2021-09-26 ENCOUNTER — Ambulatory Visit: Payer: 59 | Admitting: "Endocrinology

## 2021-09-26 ENCOUNTER — Telehealth: Payer: Self-pay | Admitting: "Endocrinology

## 2021-09-26 ENCOUNTER — Encounter: Payer: Self-pay | Admitting: "Endocrinology

## 2021-09-26 VITALS — BP 102/64 | HR 76 | Ht 64.0 in | Wt 240.8 lb

## 2021-09-26 DIAGNOSIS — D351 Benign neoplasm of parathyroid gland: Secondary | ICD-10-CM | POA: Diagnosis not present

## 2021-09-26 DIAGNOSIS — E212 Other hyperparathyroidism: Secondary | ICD-10-CM | POA: Diagnosis not present

## 2021-09-26 NOTE — Telephone Encounter (Signed)
Pt scheduled for DEXA scan September 18 at 11:00. Pt made aware.

## 2021-09-26 NOTE — Telephone Encounter (Signed)
Patient needs a bone density before next visit in 2 weeks.

## 2021-09-26 NOTE — Progress Notes (Unsigned)
Endocrinology Consult Note       09/26/2021, 9:31 PM  Sherri Howard is a 66 y.o.-year-old female, referred by her  [name], DO  , for evaluation for hypercalcemia/hyperparathyroidism.   Past Medical History:  Diagnosis Date   Anemia    Anxiety    Chronic rhinitis    evaluated by dr y. Maudie Mercury ([facility]) note in epic 11-24-2019   Depression    DOE (dyspnea on exertion) 04-05-2020  per pt occasional gets sob sitting, lying, getting up and down, and with exertion   04-05-2020 pt pcp sent to cardiologist,  dr Stanford Breed for evalution, note in epic 03-28-2020 (ordered echo and cardiac CT and they have been scheduled   Endometrial polyp    First degree heart block    Headache    History of adenomatous polyp of colon    History of kidney stones    History of palpitations    previous had event monitor 2017 showed SR/ first degree heart block, no arrhythmia's and normal echo   History of seizure    ED visit 08-22-2014 in epic for possible seizure, per ed note adverse reaction to prednisone stimulant    Hyperlipidemia    Hypertension    followed by pcp   and cardiology (nuclear stress test 01-11- 2018 in epic, normal perfusion no ischemia with normal LV function and wall motion, nuclear ef 74%)   IBS (irritable bowel syndrome)    Nephrolithiasis    04-05-2020 per pt bilateral nonobstructive   OSA on CPAP    study in epic 08-31-1998 mild osa, recommended cpap   PMB (postmenopausal bleeding)    Refusal of blood transfusions as patient is Jehovah's Witness    SUI (stress urinary incontinence, female)     Past Surgical History:  Procedure Laterality Date   BREAST SURGERY  1974   lumpectomy, per pt benign   CYSTOSCOPY/RETROGRADE/URETEROSCOPY  02-01-2008  '@[facility]'$    CYSTOSCOPY/URETEROSCOPY/HOLMIUM LASER/STENT PLACEMENT  2008;  11-07-2015 '@[facility]'$    HYSTEROSCOPY WITH D & C N/A 04/09/2020   Procedure: DILATATION AND  CURETTAGE /HYSTEROSCOPY;  Surgeon: Salvadore Dom, MD;  Location: [facility];  Service: Gynecology;  Laterality: N/A;   LAPAROSCOPIC CHOLECYSTECTOMY  2014   LAPAROSCOPIC ILEOCECECTOMY  09/07/2012   @ [facility]   W/  APPENDECTOMY (done for large cecal polyp, non-malignant)   OVARIAN CYST SURGERY  1988   unsure which side   TONSILLECTOMY  05/1973   VENTRAL HERNIA REPAIR  07-31-2014   '@[facility]'$    incisional  (open)    Social History   Tobacco Use   Smoking status: Never   Smokeless tobacco: Never  Vaping Use   Vaping Use: Never used  Substance Use Topics   Alcohol use: Not Currently    Comment: Rare   Drug use: Never    Family History  Problem Relation Age of Onset   Cancer Mother    Aneurysm Mother    Allergic rhinitis Mother    Breast cancer Mother 43   Atrial fibrillation Mother    Cancer Father    Hypertension Father    Alzheimer's disease Father    CAD  Father    Allergic rhinitis Brother    COPD Maternal Aunt    Eczema Son    Bronchitis Son    Sinusitis Son    Asthma Neg Hx    Urticaria Neg Hx    Immunodeficiency Neg Hx    Atopy Neg Hx    Angioedema Neg Hx     Outpatient Encounter Medications as of 09/26/2021  Medication Sig   Lactobacillus (PROBIOTIC ACIDOPHILUS PO) Take 1 tablet by mouth daily.   Omega-3 Fatty Acids (FISH OIL PO) Take 1 capsule by mouth daily.   acetaminophen (TYLENOL) 325 MG tablet Take 650 mg by mouth every 6 (six) hours as needed for mild pain or moderate pain.    ALPRAZolam (XANAX) 0.5 MG tablet Take 0.5-1 tablets (0.25-0.5 mg total) by mouth daily as needed for anxiety. May take extra tablet during the day prn panic.   amLODipine (NORVASC) 5 MG tablet Take 1 tablet (5 mg total) by mouth daily.   busPIRone (BUSPAR) 10 MG tablet Take 2 tablets (20 mg total) by mouth 2 (two) times daily. FOR ANXIETY   carvedilol (COREG) 6.25 MG tablet Take 1 tablet (6.25 mg total) by mouth 2 (two) times daily.   diphenhydrAMINE (BENADRYL) 25  mg capsule Take by mouth.   famotidine (PEPCID) 20 MG tablet Take one tablet ('20mg'$ ) by mouth twice a day   hyoscyamine (LEVSIN SL) 0.125 MG SL tablet DISSOLVE 1 TABLET UNDER THE TONGUE EVERY 6 HOURS AS NEEDED   Insulin Pen Needle 32G X 6 MM MISC UAD with saxenda   Liraglutide -Weight Management (SAXENDA) 18 MG/3ML SOPN Inject 3 mg into the skin daily.   losartan (COZAAR) 100 MG tablet Take 1 tablet (100 mg total) by mouth daily.   methocarbamol (ROBAXIN) 500 MG tablet Take 1 tablet (500 mg total) by mouth every 8 (eight) hours as needed for muscle spasms.   potassium citrate (UROCIT-K) 10 MEQ (1080 MG) SR tablet Take 10 mEq by mouth 3 (three) times daily.   rosuvastatin (CRESTOR) 20 MG tablet Take 1 tablet (20 mg total) by mouth daily.   Semaglutide-Weight Management (WEGOVY) 2.4 MG/0.75ML SOAJ Inject 2.4 mg into the skin every 7 (seven) days.   Vilazodone HCl (VIIBRYD) 40 MG TABS Take 1 tablet (40 mg total) by mouth daily.   [DISCONTINUED] Semaglutide-Weight Management (WEGOVY) 1.7 MG/0.75ML SOAJ Inject 1.7 mg into the skin every 7 (seven) days. For 1 month then start 2.'4mg'$  next month   No facility-administered encounter medications on file as of 09/26/2021.    Allergies  Allergen Reactions   Ciprofloxacin Rash   Clindamycin Rash   Influenza Vaccines Rash   Latex Rash   Other     PER PT JEHOVAH WITNESS, BLOOD PRODUCT REFUSAL   Temazepam Rash   Enablex [Darifenacin Hydrobromide Er] Swelling    Causes swelling of patient's tongue   Hydroxyzine Hcl     Per pt was given prior to scheduled tonsillectomy in 1975 and had convulsions under anesthesia   Prednisolone Other (See Comments)    Convulsions per pt   Amoxicillin Diarrhea   Oxycodone Rash    percocet   Pantoprazole Rash   Pneumococcal Vaccine Swelling and Rash    Lymph node swelling   Sulfa Antibiotics Rash   Tape Rash    Bandages, etc.     HPI  Sherri Howard was diagnosed with hypercalcemia in .  Patient has no previously  history of nephrolithiasis.  She denies history of pituitary, thyroid, adrenal dysfunctions.  She has mild CKD.  She was found to have high normal calcium at 9.9 and 9.5 associated with high PTH of 90.  She has a parathyroid scan which showed possible adenoma on the right superior gland.   -no family history of pituitary, adrenal, thyroid  dysfunctions. -She does not recall if she ever had bone density.  She did not have any prior measurement of 24-hour urine calcium.  she is not on HCTZ or other thiazide therapy.  No history of  vitamin D deficiency.   she is not on calcium supplements. she denies any prior history of  osteopenia/ Osteoporosis.  -Her other medical problems include high blood pressure on medications including amlodipine, carvedilol, losartan, hyperlipidemia on Crestor, omega-3 fatty acids. Her records indicate A1c of 5.1% recently, previously 5.8%. Patient is on Wegovy 2.4 mg every 7 days. Patient believes she has lost some inches of height.  ROS:  Constitutional: + Progressive weight loss, no fatigue, no subjective hyperthermia, no subjective hypothermia Eyes: no blurry vision, no xerophthalmia ENT: no sore throat, no nodules palpated in throat, no dysphagia/odynophagia, no hoarseness Cardiovascular: no Chest Pain, no Shortness of Breath, no palpitations, no leg swelling Respiratory: no cough, no shortness of breath  Gastrointestinal: no Nausea/Vomiting/Diarhhea Musculoskeletal: no muscle/joint aches Skin: no rashes Neurological: no tremors, no numbness, no tingling, no dizziness Psychiatric: no depression, no anxiety  PE: BP 102/64   Pulse 76   Ht '5\' 4"'$  (1.626 m)   Wt 240 lb 12.8 oz (109.2 kg)   BMI 41.33 kg/m , Body mass index is 41.33 kg/m. Wt Readings from Last 3 Encounters:  09/26/21 240 lb 12.8 oz (109.2 kg)  09/05/21 244 lb (110.7 kg)  07/31/21 250 lb 6.4 oz (113.6 kg)    Constitutional: + BMI 41.3, not in acute distress, normal state of mind Eyes:  PERRLA, EOMI, no exophthalmos ENT: moist mucous membranes, no gross thyromegaly, no gross cervical lymphadenopathy Cardiovascular: normal precordial activity, Regular Rate and Rhythm, no Murmur/Rubs/Gallops Respiratory:  adequate breathing efforts, no gross chest deformity, Clear to auscultation bilaterally Gastrointestinal: abdomen soft, Non -tender, No distension, Bowel Sounds present Musculoskeletal: no gross deformities, strength intact in all four extremities Skin: moist, warm, no rashes Neurological: no tremor with outstretched hands, Deep tendon reflexes normal in bilateral lower extremities.     CMP ( most recent) CMP     Component Value Date/Time   NA 139 04/01/2021 1328   K 4.4 04/01/2021 1328   CL 103 04/01/2021 1328   CO2 22 04/01/2021 1328   GLUCOSE 105 (H) 04/01/2021 1328   GLUCOSE 111 (H) 04/09/2020 0920   BUN 13 04/01/2021 1328   CREATININE 1.15 (H) 04/01/2021 1328   CREATININE 1.07 (H) 03/08/2019 1540   CALCIUM 9.9 04/01/2021 1328   PROT 6.7 04/01/2021 1328   ALBUMIN 4.3 04/01/2021 1328   AST 32 04/01/2021 1328   ALT 33 (H) 04/01/2021 1328   ALKPHOS 73 04/01/2021 1328   BILITOT 0.5 04/01/2021 1328   GFRNONAA 58 (L) 11/07/2019 1229   GFRNONAA 56 (L) 03/08/2019 1540   GFRAA 67 11/07/2019 1229   GFRAA 65 03/08/2019 1540     Diabetic Labs (most recent): Lab Results  Component Value Date   HGBA1C 5.3 11/07/2019   HGBA1C 5.8 04/14/2016     Lipid Panel ( most recent) Lipid Panel     Component Value Date/Time   CHOL 111 07/02/2021 1310   CHOL 121 06/13/2020 0000   TRIG 112 07/02/2021 1310   HDL 52 07/02/2021  1310   HDL 53 06/13/2020 0000   CHOLHDL 2.1 07/02/2021 1310   LDLCALC 39 07/02/2021 1310   LABVLDL 20 06/13/2020 0000      Lab Results  Component Value Date   TSH 1.190 11/07/2019   TSH 3.350 03/23/2019   TSH 3.060 06/04/2015   FREET4 0.89 06/04/2015     July 02, 2021 labs: Calcium 9.9, BUN 12, creatinine 1.14, albumin 4.4, PTH 90.  Jun 01, 2021 vitamin B12 430.   Assessment: 1. Hyperparathyroidism 2.  Parathyroid adenoma  Plan: Patient has had several instances of high normal calcium up to 10 mg per DL, elevated PTH of 90 on the background of mild CKD.   -A diagnosis of primary hyperparathyroidism is possibly due to her positive nuclear medicine scan showing possible right superior parathyroid adenoma.  - No apparent complications from hypercalcemia/hyperparathyroidism: Osteoporosis, fragility fractures, abdominal pain, mood disorders, no bone pain.    Reports history of calcium based  nephrolithiasis.   Her  work-up so far is not sufficient to reach a conclusive diagnosis for definitive treatment.  She will need confirmatory studies including 24-hour urine collection for calcium and creatinine.  She will also need bone density to assess her skeletal health.   - I discussed with the patient about the physiology of calcium and parathyroid hormone, and possible  effects of  increased PTH/ Calcium , including kidney stones, cardiac dysrhythmias, osteoporosis, abdominal pain, etc.    It is also essential to obtain 24-hour urine calcium/creatinine to rule out the rare but important cause of mild elevation in calcium and PTH- [place] ( Familial Hypocalciuric Hypercalcemia), which may not require any active intervention.  - I will request for her next DEXA scan to include the distal  33% of  radius for evaluation of cortical bone, which is predominantly affected by hyperparathyroidism.   -Patient will return in 2 weeks with her results.  If her results are reinforced presenting with her primary hyperparathyroidism, she may be offered surgical treatment.  If she hesitates or would like to avoid surgery, she will be considered for intervention with Sensipar.    She is advised to maintain close follow-up follow-up with her PCP as well as nephrologist.   - Time spent with the patient: 50 minutes, of which >50% was spent in obtaining  information about her symptoms, reviewing her previous labs, evaluations, and treatments, counseling her about her hyperparathyroidism/parathyroid adenoma, and developing a plan to confirm the diagnosis and long term treatment as necessary.  Please refer to " Patient Self Inventory" in the Media  tab for reviewed elements of pertinent patient history.  Theressa Stamps participated in the discussions, expressed understanding, and voiced agreement with the above plans.  All questions were answered to her satisfaction. she is encouraged to contact clinic should she have any questions or concerns prior to her return visit.  - Return in about 2 weeks (around 10/10/2021) for DXA Scan B4 NV, 24 Hr Urine Ca & Cr.   Glade Lloyd, MD [facility] [address] [place], [place] [address] Phone: [phone]  Fax: [phone]    This note was partially dictated with voice recognition software. Similar sounding words can be transcribed inadequately or may not  be corrected upon review.  09/26/2021, 9:31 PM

## 2021-09-27 DIAGNOSIS — E212 Other hyperparathyroidism: Secondary | ICD-10-CM | POA: Insufficient documentation

## 2021-09-27 IMAGING — CT CT CARDIAC CORONARY ARTERY CALCIUM SCORE
3 series · 14 of 20 positions shown, 15 images · non-contrast
Comparison: CTA chest 09/15/2019.
COMPARISON: CTA chest 09/15/2019.

Addendum:
EXAM:
OVER-READ INTERPRETATION  CT CHEST

The following report is an over-read performed by radiologist Dr.
Kerlew Norga [REDACTED] on 04/24/2020. This over-read
does not include interpretation of cardiac or coronary anatomy or
pathology. The calcium score interpretation by the cardiologist is
attached.
CLINICAL DATA: Cardiovascular Disease Risk stratification
Coronary Calcium Score
TECHNIQUE: A gated, non-contrast computed tomography scan of the heart was
performed using 3mm slice thickness. Axial images were analyzed on a
dedicated workstation. Calcium scoring of the coronary arteries was
performed using the Agatston method.

[Series 2: casc 3.0 bv41 2 bestdiast 74 % · axial · 0.42mm/px · z∈[-202,-130]mm · 4 of 41 slices shown, 5 images]
[im 9/41  vessel]
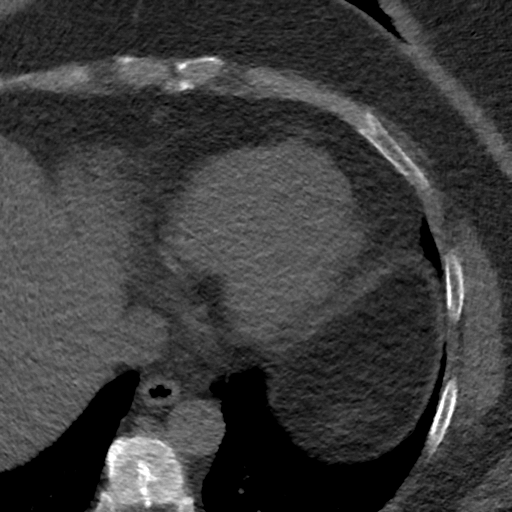
[im 9/41  lung]
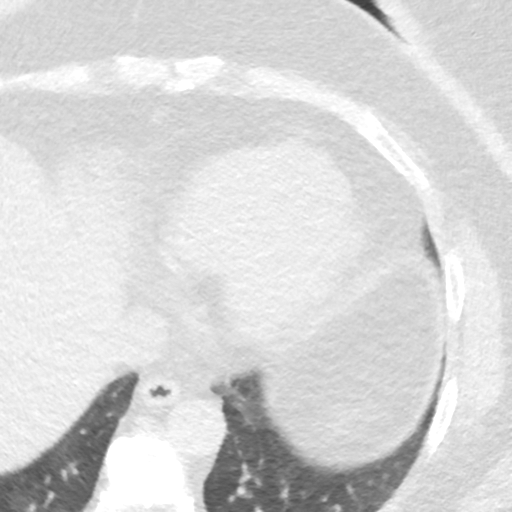
[im 17/41  vessel]
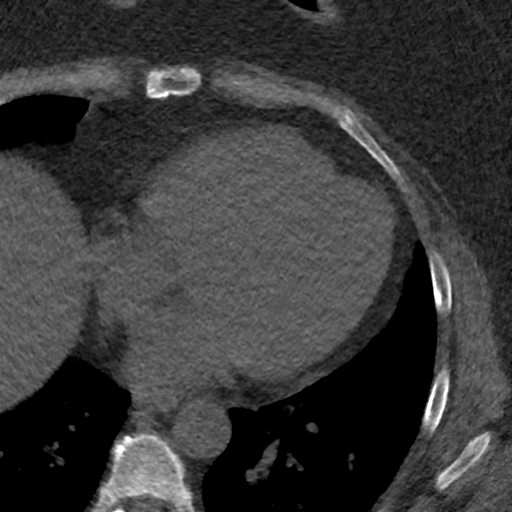
[im 25/41  vessel]
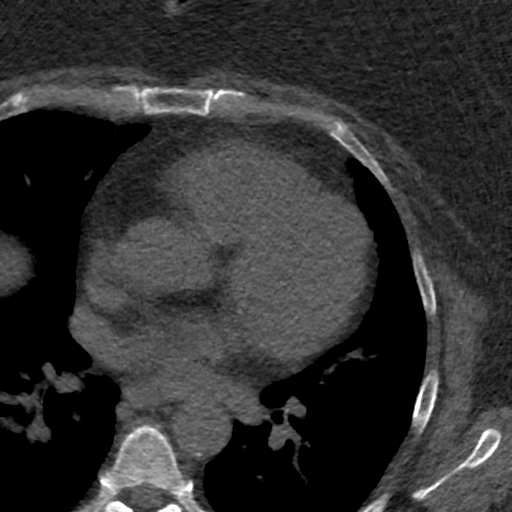
[im 33/41  vessel]
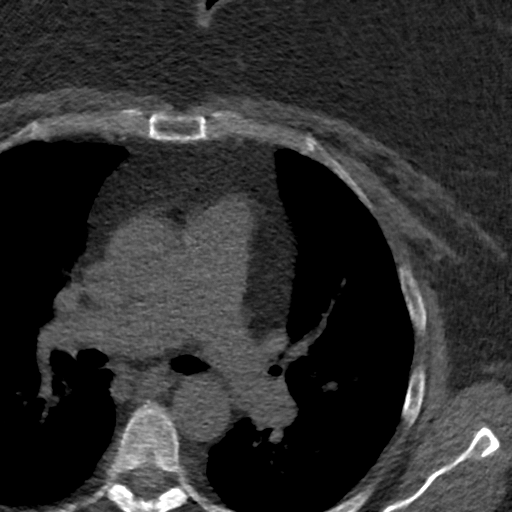

[Series 3: lung 74 % · axial · 0.74mm/px · z∈[-208,-126]mm · 5 of 41 slices shown]
[im 7/41  lung]
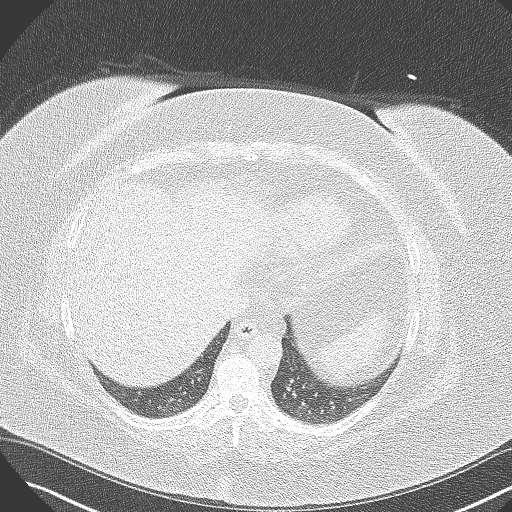
[im 14/41  lung]
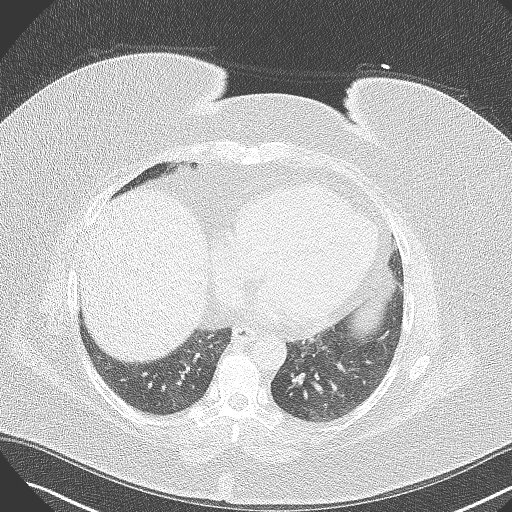
[im 21/41  lung]
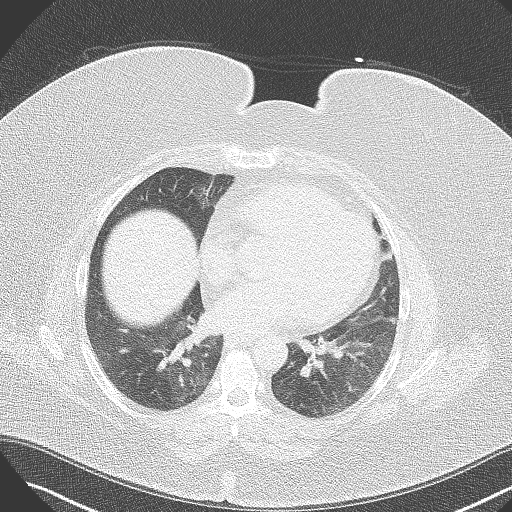
[im 27/41  lung]
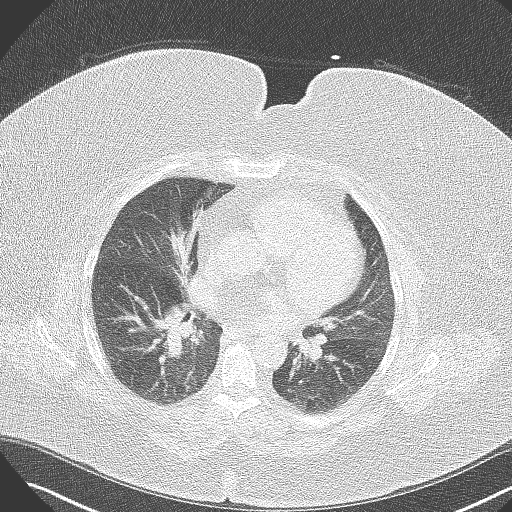
[im 34/41  lung]
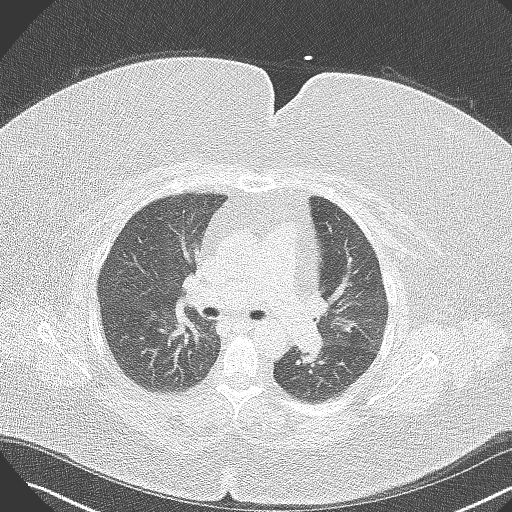

[Series 4: lung st 74 % · axial · 0.74mm/px · z∈[-208,-126]mm · 5 of 41 slices shown]
[im 7/41  lung]
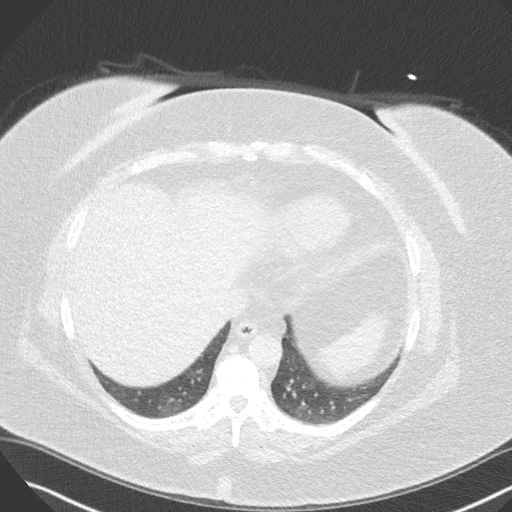
[im 14/41  lung]
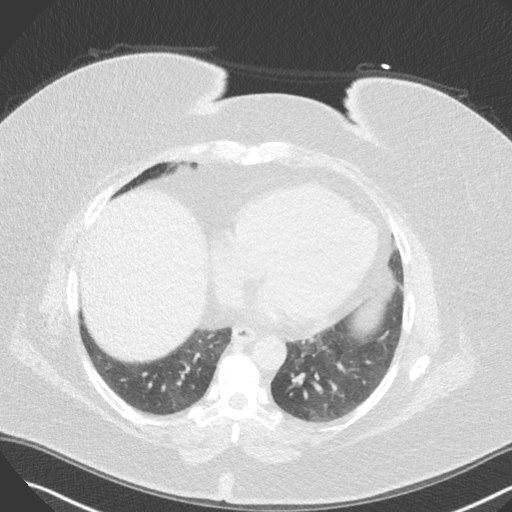
[im 21/41  lung]
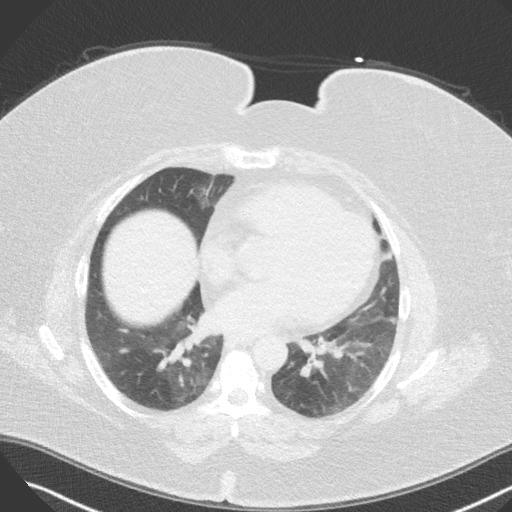
[im 27/41  lung]
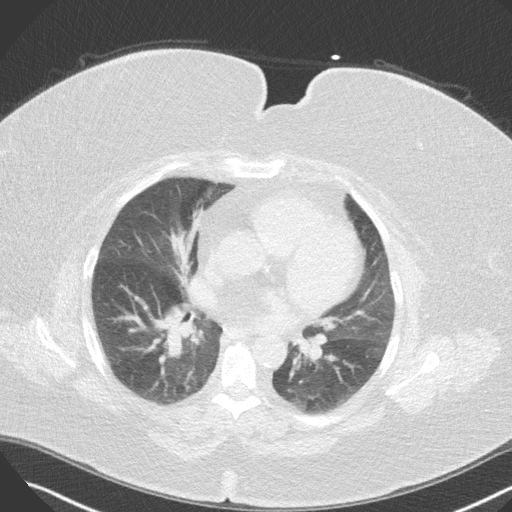
[im 34/41  lung]
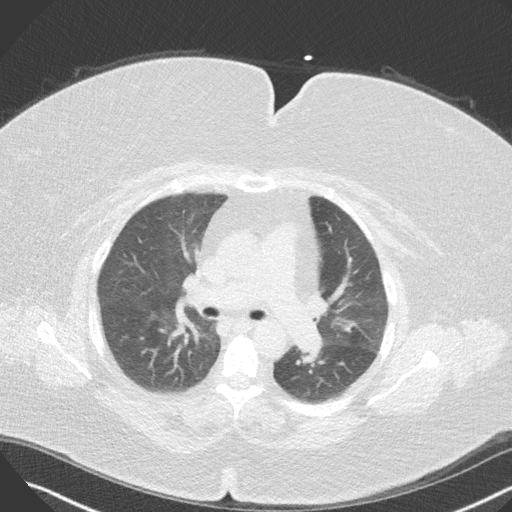

[14 of 20 positions shown; findings below may reference images not displayed]

FINDINGS: Vascular: Aortic atherosclerosis.

Mediastinum/Nodes: No imaged thoracic adenopathy.

Lungs/Pleura: No pleural fluid.  Clear imaged lungs.

Upper Abdomen: Moderate hepatic steatosis. Segment 2 3.0 cm
low-density lesion is likely a cyst, but incompletely imaged.
Similar to on the prior.

Musculoskeletal: No acute osseous abnormality.
IMPRESSION: 1. No acute findings in the imaged extracardiac chest.
2. Hepatic steatosis.
3. Aortic Atherosclerosis ([id number].I).
FINDINGS: Coronary arteries: Normal origins.

Coronary Calcium Score:

Left main: 4

Left anterior descending artery: 0

Left circumflex artery: 0

Right coronary artery: 0

Total: 4

Percentile: 50th

Pericardium: Normal.

Aorta: Normal caliber of ascending aorta. Trivial aortic
atherosclerosis noted.

Non-cardiac: See separate report from [REDACTED].
IMPRESSION: Coronary calcium score of 4. This was 50th percentile for age-,
race-, and sex-matched controls.



If CAC=0, it is reasonable to withhold statin therapy and reassess
in 5 to 10 years, as long as higher risk conditions are absent
(diabetes mellitus, family history of premature CHD in first degree
relatives (males <55 years; females <65 years), cigarette smoking,
or LDL >=190 mg/dL).

If CAC is 1 to 99, it is reasonable to initiate statin therapy for
patients >=55 years of age.

If CAC is >=100 or >=75th percentile, it is reasonable to initiate
statin therapy at any age.

Cardiology referral should be considered for patients with CAC
scores >=400 or >=75th percentile.

*5902 AHA/ACC/AACVPR/AAPA/ABC/ATHEANA/PEHKONEN/SHALLOTE/[place]al[place]h/IL/GUNTER/JIM
Guideline on the Management of Blood Cholesterol: A Report of the
American College of Cardiology/American Heart Association Task Force
on Clinical Practice Guidelines. J Am Coll Cardiol.
9475;73(24):9344-9933.

*** End of Addendum ***
EXAM:
OVER-READ INTERPRETATION  CT CHEST

The following report is an over-read performed by radiologist Dr.
Kerlew Norga [REDACTED] on 04/24/2020. This over-read
does not include interpretation of cardiac or coronary anatomy or
pathology. The calcium score interpretation by the cardiologist is
attached.
FINDINGS: Vascular: Aortic atherosclerosis.

Mediastinum/Nodes: No imaged thoracic adenopathy.

Lungs/Pleura: No pleural fluid.  Clear imaged lungs.

Upper Abdomen: Moderate hepatic steatosis. Segment 2 3.0 cm
low-density lesion is likely a cyst, but incompletely imaged.
Similar to on the prior.

Musculoskeletal: No acute osseous abnormality.
IMPRESSION: 1. No acute findings in the imaged extracardiac chest.
2. Hepatic steatosis.
3. Aortic Atherosclerosis ([id number].I).

## 2021-09-30 ENCOUNTER — Ambulatory Visit (HOSPITAL_COMMUNITY)
Admission: RE | Admit: 2021-09-30 | Discharge: 2021-09-30 | Disposition: A | Discharge status: Home / Self Care | Payer: 59 | Source: Ambulatory Visit | Attending: "Endocrinology | Admitting: "Endocrinology

## 2021-09-30 DIAGNOSIS — Z780 Asymptomatic menopausal state: Secondary | ICD-10-CM | POA: Diagnosis not present

## 2021-09-30 DIAGNOSIS — M85852 Other specified disorders of bone density and structure, left thigh: Secondary | ICD-10-CM | POA: Diagnosis not present

## 2021-09-30 DIAGNOSIS — D351 Benign neoplasm of parathyroid gland: Secondary | ICD-10-CM | POA: Diagnosis not present

## 2021-10-02 ENCOUNTER — Encounter: Payer: Self-pay | Admitting: Family Medicine

## 2021-10-03 DIAGNOSIS — D351 Benign neoplasm of parathyroid gland: Secondary | ICD-10-CM | POA: Diagnosis not present

## 2021-10-04 LAB — CREATININE, URINE, 24 HOUR
Creatinine, 24H Ur: 1074 mg/24 hr (ref 800–1800)
Creatinine, Urine: 41.3 mg/dL

## 2021-10-06 LAB — CALCIUM, URINE, 24 HOUR
Calcium, 24H Urine: 70 mg/24 hr (ref 0–320)
Calcium, Urine: 2.7 mg/dL

## 2021-10-09 ENCOUNTER — Other Ambulatory Visit: Payer: Self-pay | Admitting: Family Medicine

## 2021-10-09 MED ORDER — HYOSCYAMINE SULFATE 0.125 MG SL SUBL: SUBLINGUAL_TABLET | SUBLINGUAL | 0 refills | Status: DC

## 2021-10-14 ENCOUNTER — Ambulatory Visit: Payer: 59 | Admitting: "Endocrinology

## 2021-10-14 ENCOUNTER — Telehealth: Payer: Self-pay

## 2021-10-14 NOTE — Telephone Encounter (Signed)
Paddy Pudwill Key: [id number] - PA Case ID: [id number] - Rx #: [id number] Need help? Call us at [phone] Status Sent to [facility] Hyoscyamine Sulfate 0.'125MG'$  sublingual tablets Form [employer] Electronic [place] Form [phone] NCPDP) Original Claim Info 70,MR 01NON-FORMULARY DRUG, CONTACT PRESCRIBER 02(PHARMACY HELP DESK [phone])

## 2021-10-15 NOTE — Telephone Encounter (Signed)
Please inform pt not covered.

## 2021-10-15 NOTE — Telephone Encounter (Signed)
Notice of adverse decision [facility] [employer], the utilization review entity for [employer], received a request for coverage of Hyoscyamine Sulfate 0.'125MG'$  SL SUBL for you. We've denied the request for the following reason(s): Coverage for this medication is denied for the following reason(s). We reviewed the information we received about your condition and circumstances. We used the plan approved policy when making this decision. The policy states that this medication may be approved when: -The member has a clinical condition or needs a specific dosage form for which there is no alternative on the formulary OR -The listed formulary alternatives are not recommended based on published guidelines or clinical literature OR -The formulary alternatives will likely be ineffective or less effective for the member OR -The formulary alternatives will likely cause an adverse effect OR -The member is unable to take the required number of formulary alternatives for the given diagnosis due to a trial and inadequate treatment response or contraindication OR -The member has tried and failed the required number of formulary alternatives. Based on the policy and the information we have, your request is denied. We did not receive any documentation that you meet any of the criteria outlined above. Formulary alternative(s) are dicyclomine. Requirement: 3 in a class with 3 or more alternatives, 2 in a class with 2 alternatives, or 1 in a class with only 1 alternative. Please refer to your plan documents for a complete list of alternatives.

## 2021-10-17 ENCOUNTER — Ambulatory Visit
Admission: RE | Admit: 2021-10-17 | Discharge: 2021-10-17 | Disposition: A | Discharge status: Home / Self Care | Payer: 59 | Source: Ambulatory Visit | Attending: Family Medicine | Admitting: Family Medicine

## 2021-10-17 DIAGNOSIS — N200 Calculus of kidney: Secondary | ICD-10-CM | POA: Diagnosis not present

## 2021-10-17 DIAGNOSIS — K7689 Other specified diseases of liver: Secondary | ICD-10-CM | POA: Diagnosis not present

## 2021-10-17 DIAGNOSIS — R1900 Intra-abdominal and pelvic swelling, mass and lump, unspecified site: Secondary | ICD-10-CM

## 2021-10-17 DIAGNOSIS — R1905 Periumbilic swelling, mass or lump: Secondary | ICD-10-CM

## 2021-10-17 DIAGNOSIS — R1909 Other intra-abdominal and pelvic swelling, mass and lump: Secondary | ICD-10-CM | POA: Diagnosis not present

## 2021-10-17 DIAGNOSIS — K439 Ventral hernia without obstruction or gangrene: Secondary | ICD-10-CM | POA: Diagnosis not present

## 2021-10-17 MED ORDER — IOPAMIDOL (ISOVUE-300) INJECTION 61%
100.0000 mL | Freq: Once | INTRAVENOUS | Status: AC | PRN
  Administered 2021-10-17: 100 mL via INTRAVENOUS

## 2021-10-21 ENCOUNTER — Encounter: Payer: Self-pay | Admitting: Family Medicine

## 2021-10-29 ENCOUNTER — Ambulatory Visit: Payer: 59 | Admitting: "Endocrinology

## 2021-10-29 ENCOUNTER — Encounter: Payer: Self-pay | Admitting: "Endocrinology

## 2021-10-29 VITALS — BP 114/80 | HR 76 | Ht 64.0 in | Wt 242.0 lb

## 2021-10-29 DIAGNOSIS — D351 Benign neoplasm of parathyroid gland: Secondary | ICD-10-CM

## 2021-10-29 DIAGNOSIS — E212 Other hyperparathyroidism: Secondary | ICD-10-CM

## 2021-10-29 NOTE — Progress Notes (Signed)
10/29/2021, 4:15 PM  Endocrinology follow-up note  Sherri Howard is a 66 [place].-year-old female, referred by her  Sherri Norlander, DO  . She is here to review recent work-up results after she was seen in consultation for hypercalcemia/hyperparathyroidism.   Past Medical History:  Diagnosis Date   Anemia    Anxiety    Chronic rhinitis    evaluated by dr Sherri Howard ([facility]) note in epic 11-24-2019   Depression    DOE (dyspnea on exertion) 04-05-2020  per pt occasional gets sob sitting, lying, getting up and down, and with exertion   04-05-2020 pt pcp sent to cardiologist,  dr Stanford Breed for evalution, note in epic 03-28-2020 (ordered echo and cardiac CT and they have been scheduled   Endometrial polyp    First degree heart block    Headache    History of adenomatous polyp of colon    History of kidney stones    History of palpitations    previous had event monitor 2017 showed SR/ first degree heart block, no arrhythmia's and normal echo   History of seizure    ED visit 08-22-2014 in epic for possible seizure, per ed note adverse reaction to prednisone stimulant    Hyperlipidemia    Hypertension    followed by pcp   and cardiology (nuclear stress test 01-11- 2018 in epic, normal perfusion no ischemia with normal LV function and wall motion, nuclear ef 74%)   IBS (irritable bowel syndrome)    Nephrolithiasis    04-05-2020 per pt bilateral nonobstructive   OSA on CPAP    study in epic 08-31-1998 mild osa, recommended cpap   PMB (postmenopausal bleeding)    Refusal of blood transfusions as patient is Jehovah's Witness    SUI (stress urinary incontinence, female)     Past Surgical History:  Procedure Laterality Date   BREAST SURGERY  1974   lumpectomy, per pt benign   CYSTOSCOPY/RETROGRADE/URETEROSCOPY  02-01-2008  '@[facility]'$    CYSTOSCOPY/URETEROSCOPY/HOLMIUM LASER/STENT PLACEMENT  2008;  11-07-2015 '@[facility]'$     HYSTEROSCOPY WITH D & C N/A 04/09/2020   Procedure: DILATATION AND CURETTAGE /HYSTEROSCOPY;  Surgeon: Salvadore Dom, MD;  Location: [facility];  Service: Gynecology;  Laterality: N/A;   LAPAROSCOPIC CHOLECYSTECTOMY  2014   LAPAROSCOPIC ILEOCECECTOMY  09/07/2012   @ [facility]   W/  APPENDECTOMY (done for large cecal polyp, non-malignant)   OVARIAN CYST SURGERY  1988   unsure which side   TONSILLECTOMY  05/1973   VENTRAL HERNIA REPAIR  07-31-2014   '@[facility]'$    incisional  (open)    Social History   Tobacco Use   Smoking status: Never   Smokeless tobacco: Never  Vaping Use   Vaping Use: Never used  Substance Use Topics   Alcohol use: Not Currently    Comment: Rare   Drug use: Never    Family History  Problem Relation Age of Onset   Cancer Mother    Aneurysm Mother    Allergic rhinitis Mother    Breast cancer Mother 2   Atrial fibrillation Mother    Cancer Father  Hypertension Father    Alzheimer's disease Father    CAD Father    Allergic rhinitis Brother    COPD Maternal Aunt    Eczema Son    Bronchitis Son    Sinusitis Son    Asthma Neg Hx    Urticaria Neg Hx    Immunodeficiency Neg Hx    Atopy Neg Hx    Angioedema Neg Hx     Outpatient Encounter Medications as of 10/29/2021  Medication Sig   acetaminophen (TYLENOL) 325 MG tablet Take 650 mg by mouth every 6 (six) hours as needed for mild pain or moderate pain.    ALPRAZolam (XANAX) 0.5 MG tablet Take 0.5-1 tablets (0.25-0.5 mg total) by mouth daily as needed for anxiety. May take extra tablet during the day prn panic.   amLODipine (NORVASC) 5 MG tablet Take 1 tablet (5 mg total) by mouth daily.   busPIRone (BUSPAR) 10 MG tablet Take 2 tablets (20 mg total) by mouth 2 (two) times daily. FOR ANXIETY   carvedilol (COREG) 6.25 MG tablet Take 1 tablet (6.25 mg total) by mouth 2 (two) times daily.   diphenhydrAMINE (BENADRYL) 25 mg capsule Take by mouth.   famotidine (PEPCID) 20 MG tablet Take one  tablet ('20mg'$ ) by mouth twice a day   hyoscyamine (LEVSIN SL) 0.125 MG SL tablet DISSOLVE 1 TABLET UNDER THE TONGUE EVERY 6 HOURS AS NEEDED   Insulin Pen Needle 32G X 6 MM MISC UAD with saxenda   Lactobacillus (PROBIOTIC ACIDOPHILUS PO) Take 1 tablet by mouth daily.   Liraglutide -Weight Management (SAXENDA) 18 MG/3ML SOPN Inject 3 mg into the skin daily.   losartan (COZAAR) 100 MG tablet Take 1 tablet (100 mg total) by mouth daily.   methocarbamol (ROBAXIN) 500 MG tablet Take 1 tablet (500 mg total) by mouth every 8 (eight) hours as needed for muscle spasms.   Omega-3 Fatty Acids (FISH OIL PO) Take 1 capsule by mouth daily.   potassium citrate (UROCIT-K) 10 MEQ (1080 MG) SR tablet Take 10 mEq by mouth 3 (three) times daily.   rosuvastatin (CRESTOR) 20 MG tablet Take 1 tablet (20 mg total) by mouth daily.   Semaglutide-Weight Management (WEGOVY) 2.4 MG/0.75ML SOAJ Inject 2.4 mg into the skin every 7 (seven) days.   Vilazodone HCl (VIIBRYD) 40 MG TABS Take 1 tablet (40 mg total) by mouth daily.   No facility-administered encounter medications on file as of 10/29/2021.    Allergies  Allergen Reactions   Ciprofloxacin Rash   Clindamycin Rash   Influenza Vaccines Rash   Latex Rash   Other     PER PT JEHOVAH WITNESS, BLOOD PRODUCT REFUSAL   Temazepam Rash   Enablex [Darifenacin Hydrobromide Er] Swelling    Causes swelling of patient's tongue   Hydroxyzine Hcl     Per pt was given prior to scheduled tonsillectomy in 1975 and had convulsions under anesthesia   Prednisolone Other (See Comments)    Convulsions per pt   Amoxicillin Diarrhea   Oxycodone Rash    percocet   Pantoprazole Rash   Pneumococcal Vaccine Swelling and Rash    Lymph node swelling   Sulfa Antibiotics Rash   Tape Rash    Bandages, etc.     HPI  Sherri Howard was diagnosed with hypercalcemia in .  Patient has no previously history of nephrolithiasis.  She denies history of pituitary, thyroid, adrenal dysfunctions.   She has mild CKD.  She was found to have calcium at 9.9 and  9.5 associated with high PTH of 90.  She has a parathyroid scan which showed possible adenoma on the right superior gland.   After her last visit, she was sent for 24-hour urine calcium measurement which did not reveal hypercalciuria.  It was 70 mg / 24 hours. She also underwent DEXA scan which showed osteopenia of hip, normal spine and upper extremities -no family history of pituitary, adrenal, thyroid  dysfunctions.  she is not on HCTZ or other thiazide therapy.  No history of  vitamin D deficiency.   she is not on calcium supplements.  -Her other medical problems include high blood pressure on medications including amlodipine, carvedilol, losartan, hyperlipidemia on Crestor, omega-3 fatty acids. Her records indicate A1c of 5.1% recently, previously 5.8%. Patient is on Wegovy 2.4 mg every 7 days. Patient believes she has lost some inches of height.  ROS:  Constitutional: + Minimally fluctuating body weight,  no fatigue, no subjective hyperthermia, no subjective hypothermia Eyes: no blurry vision, no xerophthalmia ENT: no sore throat, no nodules palpated in throat, no dysphagia/odynophagia, no hoarseness Cardiovascular: no Chest Pain, no Shortness of Breath, no palpitations, no leg swelling Respiratory: no cough, no shortness of breath  Gastrointestinal: no Nausea/Vomiting/Diarhhea Musculoskeletal: no muscle/joint aches Skin: no rashes Neurological: no tremors, no numbness, no tingling, no dizziness Psychiatric: no depression, no anxiety  PE: BP 114/80   Pulse 76   Ht '5\' 4"'$  (1.626 m)   Wt 242 lb (109.8 kg)   BMI 41.54 kg/m , Body mass index is 41.54 kg/m. Wt Readings from Last 3 Encounters:  10/29/21 242 lb (109.8 kg)  09/26/21 240 lb 12.8 oz (109.2 kg)  09/05/21 244 lb (110.7 kg)    Constitutional: + BMI 41.3, not in acute distress, normal state of mind Eyes: PERRLA, EOMI, no exophthalmos ENT: moist mucous  membranes, no gross thyromegaly, no gross cervical lymphadenopathy    CMP ( most recent) CMP     Component Value Date/Time   NA 139 04/01/2021 1328   K 4.4 04/01/2021 1328   CL 103 04/01/2021 1328   CO2 22 04/01/2021 1328   GLUCOSE 105 (H) 04/01/2021 1328   GLUCOSE 111 (H) 04/09/2020 0920   BUN 13 04/01/2021 1328   CREATININE 1.15 (H) 04/01/2021 1328   CREATININE 1.07 (H) 03/08/2019 1540   CALCIUM 9.9 04/01/2021 1328   PROT 6.7 04/01/2021 1328   ALBUMIN 4.3 04/01/2021 1328   AST 32 04/01/2021 1328   ALT 33 (H) 04/01/2021 1328   ALKPHOS 73 04/01/2021 1328   BILITOT 0.5 04/01/2021 1328   GFRNONAA 58 (L) 11/07/2019 1229   GFRNONAA 56 (L) 03/08/2019 1540   GFRAA 67 11/07/2019 1229   GFRAA 65 03/08/2019 1540     Diabetic Labs (most recent): Lab Results  Component Value Date   HGBA1C 5.3 11/07/2019   HGBA1C 5.8 04/14/2016     Lipid Panel ( most recent) Lipid Panel     Component Value Date/Time   CHOL 111 07/02/2021 1310   CHOL 121 06/13/2020 0000   TRIG 112 07/02/2021 1310   HDL 52 07/02/2021 1310   HDL 53 06/13/2020 0000   CHOLHDL 2.1 07/02/2021 1310   LDLCALC 39 07/02/2021 1310   LABVLDL 20 06/13/2020 0000      Lab Results  Component Value Date   TSH 1.190 11/07/2019   TSH 3.350 03/23/2019   TSH 3.060 06/04/2015   FREET4 0.89 06/04/2015     July 02, 2021 labs: Calcium 9.9, BUN 12, creatinine 1.14, albumin 4.4,  PTH 90.  Jun 01, 2021 vitamin B12 430.  Recent Results (from the past 2160 hour(s))  Creatinine, urine, 24 hour     Status: None   Collection Time: 10/03/21  1:54 PM  Result Value Ref Range   Creatinine, Urine 41.3 Not Estab. mg/dL   Creatinine, 24H Ur 1,074 800 - 1,800 mg/24 hr  Calcium, urine, 24 hour     Status: None   Collection Time: 10/03/21  1:58 PM  Result Value Ref Range   Calcium, Urine 2.7 Not Estab. mg/dL   Calcium, 24H Urine 70 0 - 320 mg/24 hr   Bone density on September 30, 2021: AP Spine L1-L4 09/30/2021 64.0 Normal 0.3  1.214 g/cm2 -   DualFemur Neck Left 09/30/2021 64.0 Osteopenia -2.1 0.743 g/cm2 - -   DualFemur Total Mean 09/30/2021 64.0 Normal -0.9 0.898 g/cm2 - -   Left Forearm Radius 33% 09/30/2021 64.0 Normal 0.6 0.757 g/cm2 - - ASSESSMENT: The BMD measured at Femur Neck Left is 0.743 g/cm2 with a T-score of -2.1. This patient is considered osteopenic according to [facility]) criteria. The scan quality is good.  Assessment: 1. Hyperparathyroidism 2.  Parathyroid adenoma 3.  Osteopenia of hip  Plan: I reviewed her existing as well as new labs/bone density results with her and her husband.   The findings of is documentation of high PTH 190 on the background of mild CKD without significant hypercalcemia nor hypercalciuria.   She has osteopenia of hip and history of nephrolithiasis. -A diagnosis of primary hyperparathyroidism is still possible due to her positive nuclear medicine scan showing possible right superior parathyroid adenoma. -However, she does not have enough criteria for surgical intervention at this time. Options were discussed with her, next option would be observation with repeat PTH/calcium in 6 months. - No apparent complications from hypercalcemia/hyperparathyroidism: Osteoporosis, fragility fractures, abdominal pain, mood disorders, no bone pain.    Reports history of calcium based  nephrolithiasis.  She will need repeat bone density in 2 years.  - I discussed with the patient about the physiology of calcium and parathyroid hormone, and possible  effects of  increased PTH/ Calcium , including kidney stones, cardiac dysrhythmias, osteoporosis, abdominal pain, etc.    If her results are next visit confirm primary hyperparathyroidism,  she may be offered surgical treatment.  If she hesitates or would like to avoid surgery, she will be considered for intervention with Sensipar.    The couple is exploring options of weight control, currently on Wegovy.  She  believes she is doing very well.  Her husband is interested in more discussion on options.  She will be offered lifestyle medicine during next visit.  She is advised to maintain close follow-up follow-up with her PCP as well as nephrologist.   I spent 31 minutes in the care of the patient today including review of labs from Thyroid Function, CMP, and other relevant labs ; imaging/biopsy records (current and previous including abstractions from other facilities); face-to-face time discussing  her lab results and symptoms, medications doses, her options of short and long term treatment based on the latest standards of care / guidelines;   and documenting the encounter.  Theressa Stamps  participated in the discussions, expressed understanding, and voiced agreement with the above plans.  All questions were answered to her satisfaction. she is encouraged to contact clinic should she have any questions or concerns prior to her return visit. - Return in about 6 months (around 04/30/2022) for F/U with  Pre-visit Labs.   Glade Lloyd, MD [facility] [address] [place], [place] [address] Phone: [phone]  Fax: [phone]    This note was partially dictated with voice recognition software. Similar sounding words can be transcribed inadequately or may not  be corrected upon review.  10/29/2021, 4:15 PM

## 2021-11-04 ENCOUNTER — Encounter: Payer: Self-pay | Admitting: Family Medicine

## 2021-11-04 ENCOUNTER — Ambulatory Visit (INDEPENDENT_AMBULATORY_CARE_PROVIDER_SITE_OTHER): Payer: 59 | Admitting: Family Medicine

## 2021-11-04 DIAGNOSIS — F5104 Psychophysiologic insomnia: Secondary | ICD-10-CM

## 2021-11-04 DIAGNOSIS — E212 Other hyperparathyroidism: Secondary | ICD-10-CM

## 2021-11-04 DIAGNOSIS — F411 Generalized anxiety disorder: Secondary | ICD-10-CM

## 2021-11-04 DIAGNOSIS — M25551 Pain in right hip: Secondary | ICD-10-CM | POA: Diagnosis not present

## 2021-11-04 DIAGNOSIS — N1831 Chronic kidney disease, stage 3a: Secondary | ICD-10-CM

## 2021-11-04 DIAGNOSIS — M85852 Other specified disorders of bone density and structure, left thigh: Secondary | ICD-10-CM | POA: Diagnosis not present

## 2021-11-04 DIAGNOSIS — R69 Illness, unspecified: Secondary | ICD-10-CM | POA: Diagnosis not present

## 2021-11-04 DIAGNOSIS — M549 Dorsalgia, unspecified: Secondary | ICD-10-CM

## 2021-11-04 DIAGNOSIS — M8580 Other specified disorders of bone density and structure, unspecified site: Secondary | ICD-10-CM | POA: Insufficient documentation

## 2021-11-04 DIAGNOSIS — I10 Essential (primary) hypertension: Secondary | ICD-10-CM

## 2021-11-04 MED ORDER — WEGOVY 2.4 MG/0.75ML ~~LOC~~ SOAJ: SUBCUTANEOUS | 3 refills | Status: DC

## 2021-11-04 MED ORDER — ALPRAZOLAM 0.5 MG PO TABS: 0.2500 mg | ORAL_TABLET | Freq: Every day | ORAL | 5 refills | Status: DC | PRN

## 2021-11-04 MED ORDER — AMLODIPINE BESYLATE 5 MG PO TABS: 2.5000 mg | ORAL_TABLET | Freq: Every day | ORAL | 3 refills | Status: DC

## 2021-11-04 NOTE — Progress Notes (Signed)
Subjective: Sherri Howard:QJJHER obesity PCP: [name], DO Sherri Howard:CXKGY L Foote is a 66 y.o. female presenting to clinic today for:  1. Morbid obesity She unfortunately cannot take the [facility] more than once per month because of cost issues.  Insurance does not cover.  She still been doing pretty good with weight loss.  Denies any nausea, vomiting or abdominal pain.  Would like to switch over to something that is cheaper and has been considering the compound that is offered by [facility] drug.  Had her parathyroid looked at recently by endocrinology.  They have future lab orders in for her.  Continues to see nephrology as well.  Blood pressure has been running a little low, anywhere between 99 and 185 systolic over 63J.  She had her Norvasc reduced a couple of months ago to 5 mg but is still having this despite the reduction.  2.  Right hip pain Patient reports that she has been having an anterior right hip pain that radiates to the groin.  Sometimes it feels like her hip wants to pop out of joint.  This causes a sharp pain.  Denies any sensory changes.  No weakness.  Symptoms are intermittent.   ROS: Per HPI  Allergies  Allergen Reactions   Ciprofloxacin Rash   Clindamycin Rash   Influenza Vaccines Rash   Latex Rash   Other     PER PT JEHOVAH WITNESS, BLOOD PRODUCT REFUSAL   Temazepam Rash   Enablex [Darifenacin Hydrobromide Er] Swelling    Causes swelling of patient's tongue   Hydroxyzine Hcl     Per pt was given prior to scheduled tonsillectomy in 1975 and had convulsions under anesthesia   Prednisolone Other (See Comments)    Convulsions per pt   Amoxicillin Diarrhea   Oxycodone Rash    percocet   Pantoprazole Rash   Pneumococcal Vaccine Swelling and Rash    Lymph node swelling   Sulfa Antibiotics Rash   Tape Rash    Bandages, etc.   Past Medical History:  Diagnosis Date   Anemia    Anxiety    Chronic rhinitis    evaluated by dr y. Maudie Mercury ([facility] center) note in  epic 11-24-2019   Depression    DOE (dyspnea on exertion) 04-05-2020  per pt occasional gets sob sitting, lying, getting up and down, and with exertion   04-05-2020 pt pcp sent to cardiologist,  dr Stanford Breed for evalution, note in epic 03-28-2020 (ordered echo and cardiac CT and they have been scheduled   Endometrial polyp    First degree heart block    Headache    History of adenomatous polyp of colon    History of kidney stones    History of palpitations    previous had event monitor 2017 showed SR/ first degree heart block, no arrhythmia's and normal echo   History of seizure    ED visit 08-22-2014 in epic for possible seizure, per ed note adverse reaction to prednisone stimulant    Hyperlipidemia    Hypertension    followed by pcp   and cardiology (nuclear stress test 01-11- 2018 in epic, normal perfusion no ischemia with normal LV function and wall motion, nuclear ef 74%)   IBS (irritable bowel syndrome)    Nephrolithiasis    04-05-2020 per pt bilateral nonobstructive   OSA on CPAP    study in epic 08-31-1998 mild osa, recommended cpap   PMB (postmenopausal bleeding)    Refusal of blood transfusions as patient  is Jehovah's Witness    SUI (stress urinary incontinence, female)     Current Outpatient Medications:    acetaminophen (TYLENOL) 325 MG tablet, Take 650 mg by mouth every 6 (six) hours as needed for mild pain or moderate pain. , Disp: , Rfl:    ALPRAZolam (XANAX) 0.5 MG tablet, Take 0.5-1 tablets (0.25-0.5 mg total) by mouth daily as needed for anxiety. May take extra tablet during the day prn panic., Disp: 45 tablet, Rfl: 5   amLODipine (NORVASC) 5 MG tablet, Take 1 tablet (5 mg total) by mouth daily., Disp: 90 tablet, Rfl: 3   busPIRone (BUSPAR) 10 MG tablet, Take 2 tablets (20 mg total) by mouth 2 (two) times daily. FOR ANXIETY, Disp: 360 tablet, Rfl: 2   carvedilol (COREG) 6.25 MG tablet, Take 1 tablet (6.25 mg total) by mouth 2 (two) times daily., Disp: 180 tablet, Rfl:  3   diphenhydrAMINE (BENADRYL) 25 mg capsule, Take by mouth., Disp: , Rfl:    famotidine (PEPCID) 20 MG tablet, Take one tablet ('20mg'$ ) by mouth twice a day, Disp: 180 tablet, Rfl: 3   hyoscyamine (LEVSIN SL) 0.125 MG SL tablet, DISSOLVE 1 TABLET UNDER THE TONGUE EVERY 6 HOURS AS NEEDED, Disp: 30 tablet, Rfl: 0   Insulin Pen Needle 32G X 6 MM MISC, UAD with saxenda, Disp: 100 each, Rfl: 3   Lactobacillus (PROBIOTIC ACIDOPHILUS PO), Take 1 tablet by mouth daily., Disp: , Rfl:    Liraglutide -Weight Management (SAXENDA) 18 MG/3ML SOPN, Inject 3 mg into the skin daily., Disp: 45 mL, Rfl: 3   losartan (COZAAR) 100 MG tablet, Take 1 tablet (100 mg total) by mouth daily., Disp: 90 tablet, Rfl: 3   methocarbamol (ROBAXIN) 500 MG tablet, Take 1 tablet (500 mg total) by mouth every 8 (eight) hours as needed for muscle spasms., Disp: 60 tablet, Rfl: 0   Omega-3 Fatty Acids (FISH OIL PO), Take 1 capsule by mouth daily., Disp: , Rfl:    potassium citrate (UROCIT-K) 10 MEQ (1080 MG) SR tablet, Take 10 mEq by mouth 3 (three) times daily., Disp: , Rfl:    rosuvastatin (CRESTOR) 20 MG tablet, Take 1 tablet (20 mg total) by mouth daily., Disp: 90 tablet, Rfl: 3   Semaglutide-Weight Management (WEGOVY) 2.4 MG/0.75ML SOAJ, Inject 2.4 mg into the skin every 7 (seven) days., Disp: 9 mL, Rfl: 3   Vilazodone HCl (VIIBRYD) 40 MG TABS, Take 1 tablet (40 mg total) by mouth daily., Disp: 90 tablet, Rfl: 2 Social History   Socioeconomic History   Marital status: Married    Spouse name: Not on file   Number of children: 1   Years of education: Not on file   Highest education level: Not on file  Occupational History   Not on file  Tobacco Use   Smoking status: Never   Smokeless tobacco: Never  Vaping Use   Vaping Use: Never used  Substance and Sexual Activity   Alcohol use: Not Currently    Comment: Rare   Drug use: Never   Sexual activity: Not on file  Other Topics Concern   Not on file  Social History  Narrative   Not on file   Social Determinants of Health   Financial Resource Strain: Not on file  Food Insecurity: Not on file  Transportation Needs: Not on file  Physical Activity: Not on file  Stress: Not on file  Social Connections: Not on file  Intimate Partner Violence: Not on file   Family History  Problem Relation Age of Onset   Cancer Mother    Aneurysm Mother    Allergic rhinitis Mother    Breast cancer Mother 56   Atrial fibrillation Mother    Cancer Father    Hypertension Father    Alzheimer's disease Father    CAD Father    Allergic rhinitis Brother    COPD Maternal Aunt    Eczema Son    Bronchitis Son    Sinusitis Son    Asthma Neg Hx    Urticaria Neg Hx    Immunodeficiency Neg Hx    Atopy Neg Hx    Angioedema Neg Hx     Objective: Office vital signs reviewed. BP 121/71   Pulse 79   Temp 98 F (36.7 C)   Ht '5\' 4"'$  (1.626 m)   Wt 239 lb 12.8 oz (108.8 kg)   SpO2 98%   BMI 41.16 kg/m   Physical Examination:  General: Awake, alert, morbidly obese, No acute distress HEENT: Sclera white.  Moist mucous membranes Cardio: regular rate and rhythm, S1S2 heard, no murmurs appreciated Pulm: clear to auscultation bilaterally, no wheezes, rhonchi or rales; normal work of breathing on room air MSK: Gait is normal  Right hip: 5/5 strength in all planes except for extension 4/5.  She has pain with FADIR.  Negative FABER.    Assessment/ Plan: 66 y.o. female   Morbid obesity ([place]) - Plan: Semaglutide-Weight Management (WEGOVY) 2.4 MG/0.75ML SOAJ  Generalized anxiety disorder - Plan: ALPRAZolam (XANAX) 0.5 MG tablet  Psychophysiological insomnia - Plan: ALPRAZolam (XANAX) 0.5 MG tablet  Chronic kidney disease, stage 3a (HCC)  Other hyperparathyroidism (HCC)  Osteopenia of left femoral neck  Right hip pain  Musculoskeletal back pain - Plan: Semaglutide-Weight Management (WEGOVY) 2.4 MG/0.75ML SOAJ  Essential hypertension - Plan: amLODipine (NORVASC) 5  MG tablet, Semaglutide-Weight Management (WEGOVY) 2.4 MG/0.75ML SOAJ  I have sent in a prescription to [facility] drug for the compounded somatically tied with B12.  May advance more rapidly given max dose usage of Wegovy currently.  Alprazolam renewed.  She is up-to-date on UDS and CSC.  National narcotic database reviewed and there were no red flags  I reviewed her records from both the endocrinologist and the nephrologist.  We will plan for repeat DEXA scan in 2 years due to osteopenia T score of -2.1 noted on most recent September exam.  For that right hip pain, her exam was notable for pain with FADIR.  I have given her some home physical therapy exercises.  Again offered formal physical therapy but finances limit this at this time.  If no significant improvement with home physical therapy, we will plan for plain films of that right hip and referral  Additionally, I recommended that she reduce the Norvasc to 2.5 mg daily.  If blood pressures remain below 110 over 60 then I want her to discontinue totally and contact me.  Gave her parameters of maintain blood pressure less than 130/90  No orders of the defined types were placed in this encounter.  No orders of the defined types were placed in this encounter.    [name], DO [place] [phone]

## 2021-11-04 NOTE — Patient Instructions (Signed)
Reduce Amlodipine to 1/2 tablet (2.'5mg'$ ) daily. Monitor BP. Goal <130/90 but above 110/60. If after 2 weeks still having low BPs STOP Amlodipine and call me.

## 2021-11-18 NOTE — Telephone Encounter (Signed)
Pt has been seen in office since this telephone encounter, will close.

## 2021-12-18 ENCOUNTER — Ambulatory Visit: Payer: 59 | Admitting: Nurse Practitioner

## 2021-12-18 ENCOUNTER — Encounter: Payer: Self-pay | Admitting: Nurse Practitioner

## 2021-12-18 VITALS — BP 112/75 | HR 73 | Temp 98.7°F | Ht 64.0 in | Wt 241.0 lb

## 2021-12-18 DIAGNOSIS — M545 Low back pain, unspecified: Secondary | ICD-10-CM | POA: Diagnosis not present

## 2021-12-18 LAB — URINALYSIS
Bilirubin, UA: NEGATIVE
Glucose, UA: NEGATIVE
Ketones, UA: NEGATIVE
Nitrite, UA: NEGATIVE
Protein,UA: NEGATIVE
Specific Gravity, UA: 1.01 (ref 1.005–1.030)
Urobilinogen, Ur: 0.2 mg/dL (ref 0.2–1.0)
pH, UA: 7 (ref 5.0–7.5)

## 2021-12-18 MED ORDER — NITROFURANTOIN MONOHYD MACRO 100 MG PO CAPS: 100.0000 mg | ORAL_CAPSULE | Freq: Two times a day (BID) | ORAL | 0 refills | Status: DC

## 2021-12-18 NOTE — Addendum Note (Signed)
Addended by: Ivy Lynn on: 12/18/2021 04:49 PM   Modules accepted: Orders

## 2021-12-18 NOTE — Patient Instructions (Signed)
Hip Pain The hip is the joint between the upper legs and the lower pelvis. The bones, cartilage, tendons, and muscles of your hip joint support your body and allow you to move around. Hip pain can range from a minor ache to severe pain in one or both of your hips. The pain may be felt on the inside of the hip joint near the groin, or on the outside near the buttocks and upper thigh. You may also have swelling or stiffness in your hip area. Follow these instructions at home: Managing pain, stiffness, and swelling     If directed, put ice on the painful area. To do this: Put ice in a plastic bag. Place a towel between your skin and the bag. Leave the ice on for 20 minutes, 2-3 times a day. If directed, apply heat to the affected area as often as told by your health care provider. Use the heat source that your health care provider recommends, such as a moist heat pack or a heating pad. Place a towel between your skin and the heat source. Leave the heat on for 20-30 minutes. Remove the heat if your skin turns bright red. This is especially important if you are unable to feel pain, heat, or cold. You may have a greater risk of getting burned. Activity Do exercises as told by your health care provider. Avoid activities that cause pain. General instructions  Take over-the-counter and prescription medicines only as told by your health care provider. Keep a journal of your symptoms. Write down: How often you have hip pain. The location of your pain. What the pain feels like. What makes the pain worse. Sleep with a pillow between your legs on your most comfortable side. Keep all follow-up visits as told by your health care provider. This is important. Contact a health care provider if: You cannot put weight on your leg. Your pain or swelling continues or gets worse after one week. It gets harder to walk. You have a fever. Get help right away if: You fall. You have a sudden increase in pain  and swelling in your hip. Your hip is red or swollen or very tender to touch. Summary Hip pain can range from a minor ache to severe pain in one or both of your hips. The pain may be felt on the inside of the hip joint near the groin, or on the outside near the buttocks and upper thigh. Avoid activities that cause pain. Write down how often you have hip pain, the location of the pain, what makes it worse, and what it feels like. This information is not intended to replace advice given to you by your health care provider. Make sure you discuss any questions you have with your health care provider. Document Revised: 05/17/2018 Document Reviewed: 05/17/2018 Elsevier Patient Education  2023 Elsevier Inc.  

## 2021-12-18 NOTE — Progress Notes (Signed)
Acute Office Visit  Subjective:     [name], female    DOB: [birth year], 66 y.o.   MRN: [id number]  Chief Complaint  Patient presents with   Chest Pain    Flank Pain This is a new problem. The current episode started in the past 7 days. The problem occurs constantly. The problem is unchanged. The quality of the pain is described as aching. The pain does not radiate. The pain is moderate. The pain is The same all the time. The symptoms are aggravated by position. Stiffness is present All day. Pertinent negatives include no abdominal pain, dysuria, fever or pelvic pain. Risk factors include obesity. She has tried nothing for the symptoms.    Review of Systems  Constitutional: Negative.  Negative for chills and fever.  HENT: Negative.    Gastrointestinal:  Negative for abdominal pain, nausea and vomiting.  Genitourinary:  Positive for flank pain. Negative for dysuria and pelvic pain.  Skin: Negative.  Negative for itching and rash.  All other systems reviewed and are negative.       Objective:    BP 112/75   Pulse 73   Temp 98.7 F (37.1 C)   Ht '5\' 4"'$  (1.626 m)   Wt 241 lb (109.3 kg)   SpO2 96%   BMI 41.37 kg/m  BP Readings from Last 3 Encounters:  12/18/21 112/75  11/04/21 121/71  10/29/21 114/80   Wt Readings from Last 3 Encounters:  12/18/21 241 lb (109.3 kg)  11/04/21 239 lb 12.8 oz (108.8 kg)  10/29/21 242 lb (109.8 kg)      Physical Exam Vitals and nursing note reviewed.  Constitutional:      Appearance: She is well-developed. She is obese.  HENT:     Head: Normocephalic.     Right Ear: External ear normal.     Left Ear: External ear normal.     Nose: Nose normal.     Mouth/Throat:     Mouth: Mucous membranes are moist.     Pharynx: Oropharynx is clear.  Eyes:     Pupils: Pupils are equal, round, and reactive to light.  Cardiovascular:     Rate and Rhythm: Normal rate and regular rhythm.     Pulses: Normal pulses.     Heart  sounds: Normal heart sounds.  Pulmonary:     Effort: Pulmonary effort is normal.     Breath sounds: Normal breath sounds.  Abdominal:     General: Bowel sounds are normal.     Tenderness: There is abdominal tenderness. There is left CVA tenderness.  Neurological:     Mental Status: She is alert and oriented to person, place, and time.     No results found for any visits on 12/18/21.      Assessment & Plan:  Patient presents with symptoms of left flank pain in the past few days, she denies fever, Nausea or vomiting. Completed KUB and urinalysis with results pending. Advised patient to increase hydration, tylenol as needed for pain and follow up with unresolved symptoms. Problem List Items Addressed This Visit   None Visit Diagnoses     Low back pain, non-specific    -  Primary   Relevant Orders   Urine Culture   Urinalysis   Urinalysis, Routine w reflex microscopic   CULTURE, URINE COMPREHENSIVE   DG Abd 1 View       No orders of the defined types were placed in this encounter.  Return if symptoms worsen or fail to improve.  Ivy Lynn, NP

## 2021-12-19 ENCOUNTER — Encounter: Payer: Self-pay | Admitting: Nurse Practitioner

## 2021-12-20 ENCOUNTER — Encounter: Payer: Self-pay | Admitting: Nurse Practitioner

## 2021-12-20 ENCOUNTER — Other Ambulatory Visit: Payer: Self-pay | Admitting: Nurse Practitioner

## 2021-12-20 DIAGNOSIS — R300 Dysuria: Secondary | ICD-10-CM

## 2021-12-20 MED ORDER — PHENAZOPYRIDINE HCL 95 MG PO TABS: 95.0000 mg | ORAL_TABLET | Freq: Three times a day (TID) | ORAL | 0 refills | Status: DC | PRN

## 2021-12-21 LAB — URINE CULTURE

## 2021-12-23 ENCOUNTER — Other Ambulatory Visit: Payer: Self-pay | Admitting: Nurse Practitioner

## 2021-12-23 DIAGNOSIS — R300 Dysuria: Secondary | ICD-10-CM

## 2021-12-24 ENCOUNTER — Other Ambulatory Visit: Payer: Self-pay | Admitting: Nurse Practitioner

## 2021-12-24 DIAGNOSIS — N3000 Acute cystitis without hematuria: Secondary | ICD-10-CM

## 2021-12-24 MED ORDER — LEVOFLOXACIN 250 MG PO TABS: 250.0000 mg | ORAL_TABLET | Freq: Every day | ORAL | 0 refills | Status: DC

## 2021-12-24 NOTE — Progress Notes (Signed)
Called patient to clarify antibiotic use due to contraindication. Patient reports allergy is a rash in between elbows that resolves after completing medication, advised patient to take an antihistamine prior to medication administration and d/c if rash is worse.

## 2021-12-24 NOTE — Telephone Encounter (Signed)
I spoke with patient over the phone

## 2022-01-03 NOTE — Telephone Encounter (Signed)
Please inform pt

## 2022-01-17 DIAGNOSIS — R82991 Hypocitraturia: Secondary | ICD-10-CM | POA: Diagnosis not present

## 2022-01-17 DIAGNOSIS — Z6841 Body Mass Index (BMI) 40.0 and over, adult: Secondary | ICD-10-CM | POA: Diagnosis not present

## 2022-01-17 DIAGNOSIS — E210 Primary hyperparathyroidism: Secondary | ICD-10-CM | POA: Diagnosis not present

## 2022-01-17 DIAGNOSIS — R809 Proteinuria, unspecified: Secondary | ICD-10-CM | POA: Diagnosis not present

## 2022-01-17 DIAGNOSIS — Z8679 Personal history of other diseases of the circulatory system: Secondary | ICD-10-CM | POA: Diagnosis not present

## 2022-01-17 DIAGNOSIS — I9589 Other hypotension: Secondary | ICD-10-CM | POA: Diagnosis not present

## 2022-01-17 DIAGNOSIS — N200 Calculus of kidney: Secondary | ICD-10-CM | POA: Diagnosis not present

## 2022-01-17 DIAGNOSIS — N1831 Chronic kidney disease, stage 3a: Secondary | ICD-10-CM | POA: Diagnosis not present

## 2022-01-22 ENCOUNTER — Encounter: Payer: Self-pay | Admitting: Family Medicine

## 2022-01-24 DIAGNOSIS — N1831 Chronic kidney disease, stage 3a: Secondary | ICD-10-CM | POA: Diagnosis not present

## 2022-01-24 DIAGNOSIS — N200 Calculus of kidney: Secondary | ICD-10-CM | POA: Diagnosis not present

## 2022-01-24 DIAGNOSIS — I129 Hypertensive chronic kidney disease with stage 1 through stage 4 chronic kidney disease, or unspecified chronic kidney disease: Secondary | ICD-10-CM | POA: Diagnosis not present

## 2022-01-24 DIAGNOSIS — E210 Primary hyperparathyroidism: Secondary | ICD-10-CM | POA: Diagnosis not present

## 2022-01-24 DIAGNOSIS — R809 Proteinuria, unspecified: Secondary | ICD-10-CM | POA: Diagnosis not present

## 2022-01-24 DIAGNOSIS — Z6841 Body Mass Index (BMI) 40.0 and over, adult: Secondary | ICD-10-CM | POA: Diagnosis not present

## 2022-01-28 ENCOUNTER — Encounter: Payer: Self-pay | Admitting: Family Medicine

## 2022-02-03 ENCOUNTER — Ambulatory Visit: Payer: 59 | Admitting: Family Medicine

## 2022-02-03 ENCOUNTER — Encounter: Payer: Self-pay | Admitting: Family Medicine

## 2022-02-03 VITALS — BP 122/72 | HR 69 | Temp 98.7°F | Ht 64.0 in | Wt 237.0 lb

## 2022-02-03 DIAGNOSIS — B359 Dermatophytosis, unspecified: Secondary | ICD-10-CM

## 2022-02-03 MED ORDER — KETOCONAZOLE 2 % EX CREA: 1.0000 | TOPICAL_CREAM | Freq: Every day | CUTANEOUS | 0 refills | Status: DC

## 2022-02-03 NOTE — Progress Notes (Signed)
Subjective: Sherri Howard PCP: [name], DO XBW:IOMBT Sherri Howard is a 67 y.o. female presenting to clinic today for:  1. Rash Reports a few day history of raised rash on inner thighs bilaterally.  Denies any itching.  She was worried that this may be a result of resuming use of her Saxenda.  She really is only taking once every couple of days.  She started putting on some castor oil and it does seem to be drying it out.  Wanted to make sure that this was not a drug reaction   ROS: Per HPI  Allergies  Allergen Reactions   Ciprofloxacin Rash   Clindamycin Rash   Influenza Vaccines Rash   Latex Rash   Other     PER PT JEHOVAH WITNESS, BLOOD PRODUCT REFUSAL   Temazepam Rash   Enablex [Darifenacin Hydrobromide Er] Swelling    Causes swelling of patient's tongue   Hydroxyzine Hcl     Per pt was given prior to scheduled tonsillectomy in 1975 and had convulsions under anesthesia   Prednisolone Other (See Comments)    Convulsions per pt   Amoxicillin Diarrhea   Oxycodone Rash    percocet   Pantoprazole Rash   Pneumococcal Vaccine Swelling and Rash    Lymph node swelling   Sulfa Antibiotics Rash   Tape Rash    Bandages, etc.   Past Medical History:  Diagnosis Date   Anemia    Anxiety    Chronic rhinitis    evaluated by dr y. Maudie [facility] note in epic 11-24-2019   Depression    DOE (dyspnea on exertion) 04-05-2020  per pt occasional gets sob sitting, lying, getting up and down, and with exertion   04-05-2020 pt pcp sent to cardiologist,  dr Stanford Breed for evalution, note in epic 03-28-2020 (ordered echo and cardiac CT and they have been scheduled   Endometrial polyp    First degree heart block    Headache    History of adenomatous polyp of colon    History of kidney stones    History of palpitations    previous had event monitor 2017 showed SR/ first degree heart block, no arrhythmia's and normal echo   History of seizure    ED visit 08-22-2014 in epic  for possible seizure, per ed note adverse reaction to prednisone stimulant    Hyperlipidemia    Hypertension    followed by pcp   and cardiology (nuclear stress test 01-11- 2018 in epic, normal perfusion no ischemia with normal LV function and wall motion, nuclear ef 74%)   IBS (irritable bowel syndrome)    Nephrolithiasis    04-05-2020 per pt bilateral nonobstructive   OSA on CPAP    study in epic 08-31-1998 mild osa, recommended cpap   PMB (postmenopausal bleeding)    Refusal of blood transfusions as patient is Jehovah's Witness    SUI (stress urinary incontinence, female)     Current Outpatient Medications:    acetaminophen (TYLENOL) 325 MG tablet, Take 650 mg by mouth every 6 (six) hours as needed for mild pain or moderate pain. , Disp: , Rfl:    ALPRAZolam (XANAX) 0.5 MG tablet, Take 0.5-1 tablets (0.25-0.5 mg total) by mouth daily as needed for anxiety (PUT ON FILE). May take extra tablet during the day prn panic., Disp: 45 tablet, Rfl: 5   amLODipine (NORVASC) 5 MG tablet, Take 0.5 tablets (2.5 mg total) by mouth daily., Disp: 90 tablet, Rfl: 3   busPIRone (  BUSPAR) 10 MG tablet, Take 2 tablets (20 mg total) by mouth 2 (two) times daily. FOR ANXIETY, Disp: 360 tablet, Rfl: 2   carvedilol (COREG) 6.25 MG tablet, Take 1 tablet (6.25 mg total) by mouth 2 (two) times daily., Disp: 180 tablet, Rfl: 3   diphenhydrAMINE (BENADRYL) 25 mg capsule, Take by mouth., Disp: , Rfl:    famotidine (PEPCID) 20 MG tablet, Take one tablet ('20mg'$ ) by mouth twice a day, Disp: 180 tablet, Rfl: 3   hyoscyamine (LEVSIN SL) 0.125 MG SL tablet, DISSOLVE 1 TABLET UNDER THE TONGUE EVERY 6 HOURS AS NEEDED, Disp: 30 tablet, Rfl: 0   Lactobacillus (PROBIOTIC ACIDOPHILUS PO), Take 1 tablet by mouth daily., Disp: , Rfl:    losartan (COZAAR) 100 MG tablet, Take 1 tablet (100 mg total) by mouth daily., Disp: 90 tablet, Rfl: 3   Omega-3 Fatty Acids (FISH OIL PO), Take 1 capsule by mouth daily., Disp: , Rfl:    potassium  citrate (UROCIT-K) 10 MEQ (1080 MG) SR tablet, Take 10 mEq by mouth 3 (three) times daily., Disp: , Rfl:    rosuvastatin (CRESTOR) 20 MG tablet, Take 1 tablet (20 mg total) by mouth daily., Disp: 90 tablet, Rfl: 3   Semaglutide-Weight Management (WEGOVY) 2.4 MG/0.75ML SOAJ, Semaglutide: b12 with eden drug, Disp: 9 mL, Rfl: 3   Vilazodone HCl (VIIBRYD) 40 MG TABS, Take 1 tablet (40 mg total) by mouth daily., Disp: 90 tablet, Rfl: 2 Social History   Socioeconomic History   Marital status: Married    Spouse name: Not on file   Number of children: 1   Years of education: Not on file   Highest education level: Not on file  Occupational History   Not on file  Tobacco Use   Smoking status: Never   Smokeless tobacco: Never  Vaping Use   Vaping Use: Never used  Substance and Sexual Activity   Alcohol use: Not Currently    Comment: Rare   Drug use: Never   Sexual activity: Not on file  Other Topics Concern   Not on file  Social History Narrative   Not on file   Social Determinants of Health   Financial Resource Strain: Not on file  Food Insecurity: Not on file  Transportation Needs: Not on file  Physical Activity: Not on file  Stress: Not on file  Social Connections: Not on file  Intimate Partner Violence: Not on file   Family History  Problem Relation Age of Onset   Cancer Mother    Aneurysm Mother    Allergic rhinitis Mother    Breast cancer Mother 35   Atrial fibrillation Mother    Cancer Father    Hypertension Father    Alzheimer's disease Father    CAD Father    Allergic rhinitis Brother    COPD Maternal Aunt    Eczema Son    Bronchitis Son    Sinusitis Son    Asthma Neg Hx    Urticaria Neg Hx    Immunodeficiency Neg Hx    Atopy Neg Hx    Angioedema Neg Hx     Objective: Office vital signs reviewed. BP 122/72   Pulse 69   Temp 98.7 F (37.1 C)   Ht '5\' 4"'$  (1.626 m)   Wt 237 lb (107.5 kg)   SpO2 98%   BMI 40.68 kg/m   Physical Examination:  General:  Awake, alert, well nourished, No acute distress Skin:  irregularly shaped raised rash noted along the inner thighs  bilaterally.  There is some central clearing  Assessment/ Plan: 67 y.o. female   Tinea - Plan: ketoconazole (NIZORAL) 2 % cream  Rash appears to be fungal.  Will treat with antifungal. Discussed home care instructions and handout provide.  Follow-up as needed.  No orders of the defined types were placed in this encounter.  No orders of the defined types were placed in this encounter.    [name], DO [place] [phone]

## 2022-02-03 NOTE — Patient Instructions (Signed)
Tinea Body tinea is an infection of the skin that often causes a ring-shaped rash. Body ringworm is also called tinea corporis. It can affect any part of your skin. This condition is easily spread from person to person (is very contagious). What are the causes? This condition is caused by fungi called dermatophytes. The condition develops when these fungi grow out of control on the skin. You can get this condition if you touch a person or animal that has it. You can also get it if you share any items with an infected person or pet. These include: Clothing, bedding, and towels. Brushes or combs. Gym equipment. Any other object that has the fungus on it. What increases the risk? You are more likely to develop this condition if you: Play sports that involve close physical contact, such as wrestling. Sweat a lot. Live in areas that are hot and humid. Use public showers. Have a weakened disease-fighting system (immune system). What are the signs or symptoms? Symptoms of this condition include: Itchy, raised red spots and bumps. Red scaly patches. A ring-shaped rash. The rash may have: A clear center. Scales or red bumps at its center. Redness near its borders. Dry and scaly skin on or around it. How is this diagnosed? This condition can usually be diagnosed with a skin exam. A skin scraping may be taken from the affected area and examined under a microscope to see if the fungus is present. How is this treated? This condition may be treated with: An antifungal cream or ointment. An antifungal shampoo. Antifungal medicines. These may be prescribed if your ringworm: Is severe. Keeps coming back or lasts a long time. Follow these instructions at home: Take over-the-counter and prescription medicines only as told by your health care provider. If you were given an antifungal cream or ointment: Use it as told by your health care provider. Wash the infected area and dry it completely before  applying the cream or ointment. If you were given an antifungal shampoo: Use it as told by your health care provider. Leave the shampoo on your body for 3-5 minutes before rinsing. While you have a rash: Wear loose clothing to stop clothes from rubbing and irritating it. Wash or change your bed sheets every night. Wash clothes and bed sheets in hot water. Disinfect or throw out items that may be infected. Wash your hands often with soap and water for at least 20 seconds. If soap and water are not available, use hand sanitizer. If your pet has the same infection, take your pet to see a veterinarian for treatment. How is this prevented? Take a bath or shower every day and after every time you work out or play sports. Dry your skin completely after bathing. Wear sandals or shoes in public places and showers. Wash athletic clothes after each use. Do not share personal items with others. Avoid touching red patches of skin on other people. Avoid touching pets that have bald spots. If you touch an animal that has a bald spot, wash your hands. Contact a health care provider if: Your rash continues to spread after 7 days of treatment. Your rash is not gone in 4 weeks. The area around your rash gets red, warm, tender, and swollen. This information is not intended to replace advice given to you by your health care provider. Make sure you discuss any questions you have with your health care provider. Document Revised: 06/13/2021 Document Reviewed: 06/13/2021 Elsevier Patient Education  [place].

## 2022-03-04 ENCOUNTER — Other Ambulatory Visit: Payer: Self-pay | Admitting: Family Medicine

## 2022-03-04 DIAGNOSIS — F411 Generalized anxiety disorder: Secondary | ICD-10-CM

## 2022-03-04 DIAGNOSIS — F5104 Psychophysiologic insomnia: Secondary | ICD-10-CM

## 2022-03-23 ENCOUNTER — Other Ambulatory Visit: Payer: Self-pay | Admitting: Family Medicine

## 2022-03-24 MED ORDER — HYOSCYAMINE SULFATE 0.125 MG SL SUBL: SUBLINGUAL_TABLET | SUBLINGUAL | 0 refills | Status: DC

## 2022-04-03 ENCOUNTER — Telehealth (INDEPENDENT_AMBULATORY_CARE_PROVIDER_SITE_OTHER): Payer: 59 | Admitting: Family Medicine

## 2022-04-03 ENCOUNTER — Encounter: Payer: Self-pay | Admitting: Family Medicine

## 2022-04-03 DIAGNOSIS — K529 Noninfective gastroenteritis and colitis, unspecified: Secondary | ICD-10-CM | POA: Diagnosis not present

## 2022-04-03 NOTE — Progress Notes (Signed)
Virtual Visit via Video   I connected with patient on 04/03/22 at 1315 by a video enabled telemedicine application and verified that I am speaking with the correct person using two identifiers.  Location patient: Home, husband with pt Location provider: [facility] Persons participating in the virtual visit: Patient and Provider  I discussed the limitations of evaluation and management by telemedicine and the availability of in person appointments. The patient expressed understanding and agreed to proceed.  Subjective:   HPI:  Pt presents today for  Chief Complaint  Patient presents with   GI Problem   Pt presents today with complaints of nausea, vomiting, and diarrhea. States her family recently had the norovirus. She developed symptoms on Tuesday which included upset stomach, nausea, vomiting, diarrhea, fever, and chills. States she has improved. States she still has an upset stomach and diarrhea but all other symptoms have resolved. She is eating and drinking. States she voids at least 3 times per day. Has had 4 diarrhea stools today. No vomiting.   Review of Systems  Constitutional:  Positive for chills, fever and malaise/fatigue. Negative for diaphoresis and weight loss.  HENT: Negative.    Eyes: Negative.   Respiratory: Negative.    Cardiovascular: Negative.   Gastrointestinal:  Positive for abdominal pain, diarrhea, nausea and vomiting. Negative for blood in stool, constipation, heartburn and melena.  Genitourinary: Negative.   Musculoskeletal: Negative.   Skin:  Negative for itching and rash.  Neurological: Negative.   Endo/Heme/Allergies: Negative.   Psychiatric/Behavioral: Negative.       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   Edema 10/24/2020   Diarrhea 10/24/2020   Pharyngitis 07/18/2020   Vaginal discharge 03/13/2020   Vaginal bleeding 03/13/2020   Pruritic rash 11/24/2019   Pruritus 11/24/2019   Chronic rhinitis 11/24/2019   Multiple drug allergies 11/24/2019   Morbid obesity due to excess calories ([place]) 08/13/2016   Encounter for screening colonoscopy 07/17/2016   Depression, recurrent ([place]) 02/28/2016   Pyelonephritis 11/09/2015   Right ureteral calculus 11/07/2015   Encounter for opiate analgesic use agreement 09/20/2015   Pain medication agreement signed 09/20/2015   Acute bilateral low back pain without sciatica 08/24/2015   Fatigue 08/09/2015   Palpitations 06/26/2015   Hydronephrosis 06/18/2015   Flank pain 03/02/2015   DOE (dyspnea on exertion) 03/02/2015   Benign essential hypertension 12/20/2014   Chronic recurrent major depressive disorder ([place]) 12/20/2014   Dyslipidemia 12/20/2014   Generalized anxiety disorder 12/20/2014   Irritable bowel syndrome 12/20/2014   Morbid obesity with BMI of 40.0-44.9, adult ([place]) 12/20/2014   OSA on CPAP 12/20/2014   Lactose intolerance 12/20/2014   Insomnia 12/20/2014   Vitamin D deficiency 12/20/2014   Urinary incontinence 12/20/2014   Other specified postprocedural states 07/31/2014   S/P herniorrhaphy 07/31/2014   Ventral incisional hernia 06/16/2014   History of colectomy 09/07/2012   Adenomatous colon polyp 07/21/2012   History of renal calculi 07/21/2012    Social History   Tobacco Use   Smoking status: Never   Smokeless tobacco: Never  Substance Use Topics   Alcohol use: Not Currently    Comment: Rare    Current Outpatient Medications:    acetaminophen (TYLENOL) 325 MG tablet, Take 650 mg by mouth every 6 (six) hours as needed for  mild pain or moderate pain. , Disp: , Rfl:    ALPRAZolam (XANAX) 0.5 MG tablet, Take 0.5-1 tablets (0.25-0.5 mg total) by mouth daily as needed for anxiety (PUT ON FILE). May take  extra tablet during the day prn panic., Disp: 45 tablet, Rfl: 5   amLODipine (NORVASC) 5 MG tablet, Take 0.5 tablets (2.5 mg total) by mouth daily., Disp: 90 tablet, Rfl: 3   busPIRone (BUSPAR) 10 MG tablet, Take 2 tablets (20 mg total) by mouth 2 (two) times daily. FOR ANXIETY, Disp: 360 tablet, Rfl: 2   carvedilol (COREG) 6.25 MG tablet, Take 1 tablet (6.25 mg total) by mouth 2 (two) times daily., Disp: 180 tablet, Rfl: 3   diphenhydrAMINE (BENADRYL) 25 mg capsule, Take by mouth., Disp: , Rfl:    famotidine (PEPCID) 20 MG tablet, Take one tablet (20mg ) by mouth twice a day, Disp: 180 tablet, Rfl: 3   hyoscyamine (LEVSIN SL) 0.125 MG SL tablet, DISSOLVE 1 TABLET UNDER THE TONGUE EVERY 6 HOURS AS NEEDED, Disp: 30 tablet, Rfl: 0   ketoconazole (NIZORAL) 2 % cream, Apply 1 Application topically daily. X2 weeks, Disp: 30 g, Rfl: 0   Lactobacillus (PROBIOTIC ACIDOPHILUS PO), Take 1 tablet by mouth daily., Disp: , Rfl:    losartan (COZAAR) 100 MG tablet, Take 1 tablet (100 mg total) by mouth daily., Disp: 90 tablet, Rfl: 3   Omega-3 Fatty Acids (FISH OIL PO), Take 1 capsule by mouth daily., Disp: , Rfl:    potassium citrate (UROCIT-K) 10 MEQ (1080 MG) SR tablet, Take 10 mEq by mouth 3 (three) times daily., Disp: , Rfl:    rosuvastatin (CRESTOR) 20 MG tablet, Take 1 tablet (20 mg total) by mouth daily., Disp: 90 tablet, Rfl: 3   Semaglutide-Weight Management (WEGOVY) 2.4 MG/0.75ML SOAJ, Semaglutide: b12 with eden drug, Disp: 9 mL, Rfl: 3   Vilazodone HCl (VIIBRYD) 40 MG TABS, Take 1 tablet (40 mg total) by mouth daily., Disp: 90 tablet, Rfl: 2  Allergies  Allergen Reactions   Ciprofloxacin Rash   Clindamycin Rash   Influenza Vaccines Rash   Latex Rash   Other     PER PT JEHOVAH WITNESS, BLOOD PRODUCT REFUSAL   Temazepam Rash   Enablex [Darifenacin Hydrobromide Er] Swelling    Causes swelling of patient's tongue   Hydroxyzine Hcl     Per pt was given prior to scheduled tonsillectomy in 1975 and  had convulsions under anesthesia   Prednisolone Other (See Comments)    Convulsions per pt   Amoxicillin Diarrhea   Oxycodone Rash    percocet   Pantoprazole Rash   Pneumococcal Vaccine Swelling and Rash    Lymph node swelling   Sulfa Antibiotics Rash   Tape Rash    Bandages, etc.    Objective:   There were no vitals taken for this visit.  Patient is well-developed, well-nourished in no acute distress.  Resting comfortably at home.  Head is normocephalic, atraumatic.  No labored breathing.  Speech is clear and coherent with logical content.  Patient is alert and oriented at baseline.   Assessment and Plan:   Sherri Howard was seen today for GI problem.  Diagnoses and all orders for this visit:  Gastroenteritis, acute No indications of acute bacterial process. Symptoms are rapidly improving. Pt aware to continue symptomatic care at home with adequate hydration and a BRAT diet. Aware to advance diet as tolerated. Aware of red flags. Report new, worsening, or persistent symptoms. Follow up as needed.  Return if symptoms worsen or fail to improve.  Monia Pouch, FNP-C [facility] [address] [place], [place] [address] [phone]  04/03/2022  Time spent with the patient: 15 minutes, of which >50% was spent in obtaining information about symptoms, reviewing previous labs, evaluations, and treatments, counseling about condition (please see the discussed topics above), and developing a plan to further investigate it; had a number of questions which I addressed.

## 2022-04-23 ENCOUNTER — Telehealth: Payer: Self-pay | Admitting: Family Medicine

## 2022-04-23 NOTE — Telephone Encounter (Signed)
Would probably abandon the med then.  I worry with her allergy history she maybe developing an intolerance.  I suspect the nausea/ vomiting is related to her spacing it and her body simply not being accustomed to the dose anymore.

## 2022-04-23 NOTE — Telephone Encounter (Signed)
Patient aware and verbalized understanding. Appt made with dod for spots on face

## 2022-04-23 NOTE — Telephone Encounter (Signed)
Patient states she took her wegovy yesterday and this morning she has red splotchy face all over with a little swelling.she did have some vomiting yesterday twice. Denies sob or hives. Advised to take a benadryl just in case. Patient has been on this medication the only difference is she is spreading the shot out by a couple if weeks because of price. She has done nothing else different and no other symptoms. Please advise.

## 2022-04-24 ENCOUNTER — Encounter: Payer: Self-pay | Admitting: Nurse Practitioner

## 2022-04-24 ENCOUNTER — Telehealth: Payer: 59 | Admitting: Nurse Practitioner

## 2022-04-24 DIAGNOSIS — R233 Spontaneous ecchymoses: Secondary | ICD-10-CM

## 2022-04-24 NOTE — Patient Instructions (Signed)
Elijah Birk, thank you for joining Bennie Pierini, FNP for today's virtual visit.  While this provider is not your primary care provider (PCP), if your PCP is located in our provider database this encounter information will be shared with them immediately following your visit.   A [facility] MyChart account gives you access to today's visit and all your visits, tests, and labs performed at [facility] " click here if you don't have a [facility] MyChart account or go to [email or web address]  Consent: (Patient) Sherri Howard provided verbal consent for this virtual visit at the beginning of the encounter.  Current Medications:  Current Outpatient Medications:    acetaminophen (TYLENOL) 325 MG tablet, Take 650 mg by mouth every 6 (six) hours as needed for mild pain or moderate pain. , Disp: , Rfl:    ALPRAZolam (XANAX) 0.5 MG tablet, Take 0.5-1 tablets (0.25-0.5 mg total) by mouth daily as needed for anxiety (PUT ON FILE). May take extra tablet during the day prn panic., Disp: 45 tablet, Rfl: 5   amLODipine (NORVASC) 5 MG tablet, Take 0.5 tablets (2.5 mg total) by mouth daily., Disp: 90 tablet, Rfl: 3   busPIRone (BUSPAR) 10 MG tablet, Take 2 tablets (20 mg total) by mouth 2 (two) times daily. FOR ANXIETY, Disp: 360 tablet, Rfl: 2   carvedilol (COREG) 6.25 MG tablet, Take 1 tablet (6.25 mg total) by mouth 2 (two) times daily., Disp: 180 tablet, Rfl: 3   diphenhydrAMINE (BENADRYL) 25 mg capsule, Take by mouth., Disp: , Rfl:    famotidine (PEPCID) 20 MG tablet, Take one tablet (20mg ) by mouth twice a day, Disp: 180 tablet, Rfl: 3   hyoscyamine (LEVSIN SL) 0.125 MG SL tablet, DISSOLVE 1 TABLET UNDER THE TONGUE EVERY 6 HOURS AS NEEDED, Disp: 30 tablet, Rfl: 0   ketoconazole (NIZORAL) 2 % cream, Apply 1 Application topically daily. X2 weeks, Disp: 30 g, Rfl: 0   Lactobacillus (PROBIOTIC ACIDOPHILUS PO), Take 1 tablet by mouth daily., Disp: , Rfl:    losartan (COZAAR) 100 MG  tablet, Take 1 tablet (100 mg total) by mouth daily., Disp: 90 tablet, Rfl: 3   Omega-3 Fatty Acids (FISH OIL PO), Take 1 capsule by mouth daily., Disp: , Rfl:    potassium citrate (UROCIT-K) 10 MEQ (1080 MG) SR tablet, Take 10 mEq by mouth 3 (three) times daily., Disp: , Rfl:    rosuvastatin (CRESTOR) 20 MG tablet, Take 1 tablet (20 mg total) by mouth daily., Disp: 90 tablet, Rfl: 3   Semaglutide-Weight Management (WEGOVY) 2.4 MG/0.75ML SOAJ, Semaglutide: b12 with eden drug, Disp: 9 mL, Rfl: 3   Vilazodone HCl (VIIBRYD) 40 MG TABS, Take 1 tablet (40 mg total) by mouth daily., Disp: 90 tablet, Rfl: 2   Medications ordered in this encounter:  No orders of the defined types were placed in this encounter.    *If you need refills on other medications prior to your next appointment, please contact your pharmacy*  Follow-Up: Call back or seek an in-person evaluation if the symptoms worsen or if the condition fails to improve as anticipated.  [facility] [phone]  Other Instructions Should resolve on its own   If you have been instructed to have an in-person evaluation today at a local Urgent Care facility, please use the link below. It will take you to a list of all of our available  Urgent Cares, including address, phone number and hours of operation. Please do not delay care.  [facility]  If you or a family member do not have a primary care provider, use the link below to schedule a visit and establish care. When you choose a [facility] primary care physician or advanced practice provider, you gain a long-term partner in health. Find a Primary Care Provider  Learn more about [facility]'s in-office and virtual care options:  - Get Care Now  

## 2022-04-24 NOTE — Progress Notes (Signed)
Virtual Visit Consent   Sherri Howard, you are scheduled for a virtual visit with Mary-Margaret Daphine DeutscherMartin, FNP, a [facility] provider, today.     Just as with appointments in the office, your consent must be obtained to participate.  Your consent will be active for this visit and any virtual visit you may have with one of our providers in the next 365 days.     If you have a MyChart account, a copy of this consent can be sent to you electronically.  All virtual visits are billed to your insurance company just like a traditional visit in the office.    As this is a virtual visit, video technology does not allow for your provider to perform a traditional examination.  This may limit your provider's ability to fully assess your condition.  If your provider identifies any concerns that need to be evaluated in person or the need to arrange testing (such as labs, EKG, etc.), we will make arrangements to do so.     Although advances in technology are sophisticated, we cannot ensure that it will always work on either your end or our end.  If the connection with a video visit is poor, the visit may have to be switched to a telephone visit.  With either a video or telephone visit, we are not always able to ensure that we have a secure connection.     I need to obtain your verbal consent now.   Are you willing to proceed with your visit today? YES   Sherri Howard has provided verbal consent on 04/24/2022 for a virtual visit (video or telephone).   Mary-Margaret Daphine DeutscherMartin, FNP   Date: 04/24/2022 8:58 AM   Virtual Visit via Video Note   I, Mary-Margaret Daphine DeutscherMartin, connected with Sherri Howard ([id number], 1955/03/29) on 04/24/22 at  9:50 AM EDT by a video-enabled telemedicine application and verified that I am speaking with the correct person using two identifiers.  Location: Patient: Virtual Visit Location Patient: Home Provider: Virtual Visit Location Provider: Mobile   I discussed the limitations of  evaluation and management by telemedicine and the availability of in person appointments. The patient expressed understanding and agreed to proceed.    History of Present Illness: Sherri Howard is a 67 y.o. who identifies as a female who was assigned female at birth, and is being seen today for facial rash.  HPI: Patient states that she was vomiting on Tuesday. Was pretty violent vomiting. She said it caused her to rupture blood vessels in her face. Face is now red . She thinks that the vomiting came from saxaneda that she is taking. No itching or burning    Review of Systems  Constitutional:  Negative for diaphoresis and weight loss.  Eyes:  Negative for blurred vision, double vision and pain.  Respiratory:  Negative for shortness of breath.   Cardiovascular:  Negative for chest pain, palpitations, orthopnea and leg swelling.  Gastrointestinal:  Negative for abdominal pain.  Skin:  Negative for rash.  Neurological:  Negative for dizziness, sensory change, loss of consciousness, weakness and headaches.  Endo/Heme/Allergies:  Negative for polydipsia. Does not bruise/bleed easily.  Psychiatric/Behavioral:  Negative for memory loss. The patient does not have insomnia.   All other systems reviewed and are negative.   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   Edema 10/24/2020   Diarrhea 10/24/2020   Pharyngitis 07/18/2020   Vaginal discharge 03/13/2020   Vaginal bleeding 03/13/2020   Pruritic rash 11/24/2019   Pruritus 11/24/2019   Chronic rhinitis 11/24/2019   Multiple drug allergies 11/24/2019   Morbid obesity due to excess calories 08/13/2016   Encounter for screening colonoscopy 07/17/2016   Depression, recurrent 02/28/2016   Pyelonephritis 11/09/2015    Right ureteral calculus 11/07/2015   Encounter for opiate analgesic use agreement 09/20/2015   Pain medication agreement signed 09/20/2015   Acute bilateral low back pain without sciatica 08/24/2015   Fatigue 08/09/2015   Palpitations 06/26/2015   Hydronephrosis 06/18/2015   Flank pain 03/02/2015   DOE (dyspnea on exertion) 03/02/2015   Benign essential hypertension 12/20/2014   Chronic recurrent major depressive disorder 12/20/2014   Dyslipidemia 12/20/2014   Generalized anxiety disorder 12/20/2014   Irritable bowel syndrome 12/20/2014   Morbid obesity with BMI of 40.0-44.9, adult 12/20/2014   OSA on CPAP 12/20/2014   Lactose intolerance 12/20/2014   Insomnia 12/20/2014   Vitamin D deficiency 12/20/2014   Urinary incontinence 12/20/2014   Other specified postprocedural states 07/31/2014   S/P herniorrhaphy 07/31/2014   Ventral incisional hernia 06/16/2014   History of colectomy 09/07/2012   Adenomatous colon polyp 07/21/2012   History of renal calculi 07/21/2012    Allergies:  Allergies  Allergen Reactions   Ciprofloxacin Rash   Clindamycin Rash   Influenza Vaccines Rash   Latex Rash   Other     PER PT JEHOVAH WITNESS, BLOOD PRODUCT REFUSAL   Temazepam Rash   Enablex [Darifenacin Hydrobromide Er] Swelling    Causes swelling of patient's tongue   Hydroxyzine Hcl     Per pt was given prior to scheduled tonsillectomy in 1975 and had convulsions under anesthesia   Prednisolone Other (See Comments)    Convulsions per pt   Amoxicillin Diarrhea   Oxycodone Rash    percocet   Pantoprazole Rash   Pneumococcal Vaccine Swelling and Rash    Lymph node swelling   Sulfa Antibiotics Rash   Tape Rash    Bandages, etc.   Medications:  Current Outpatient Medications:    acetaminophen (TYLENOL) 325 MG tablet, Take 650 mg by mouth every 6 (six) hours as needed for mild pain or moderate pain. , Disp: , Rfl:    ALPRAZolam (XANAX) 0.5 MG tablet, Take 0.5-1 tablets (0.25-0.5 mg  total) by mouth daily as needed for anxiety (PUT ON FILE). May take extra tablet during the day prn panic., Disp: 45 tablet, Rfl: 5   amLODipine (NORVASC) 5 MG tablet, Take 0.5 tablets (2.5 mg total) by mouth daily., Disp: 90 tablet, Rfl: 3   busPIRone (BUSPAR) 10 MG tablet, Take 2 tablets (20 mg total) by mouth 2 (two) times daily. FOR ANXIETY, Disp: 360 tablet, Rfl: 2   carvedilol (COREG) 6.25 MG tablet, Take 1 tablet (6.25 mg total) by mouth 2 (two) times daily., Disp: 180 tablet, Rfl: 3   diphenhydrAMINE (BENADRYL) 25 mg capsule, Take by mouth., Disp: , Rfl:    famotidine (PEPCID) 20 MG tablet, Take one tablet (20mg ) by mouth twice a day, Disp: 180 tablet, Rfl: 3   hyoscyamine (LEVSIN SL) 0.125 MG SL tablet, DISSOLVE 1 TABLET UNDER THE TONGUE EVERY 6 HOURS AS NEEDED, Disp: 30 tablet, Rfl: 0   ketoconazole (NIZORAL) 2 % cream, Apply 1 Application topically daily. X2 weeks, Disp: 30 g, Rfl: 0   Lactobacillus (  PROBIOTIC ACIDOPHILUS PO), Take 1 tablet by mouth daily., Disp: , Rfl:    losartan (COZAAR) 100 MG tablet, Take 1 tablet (100 mg total) by mouth daily., Disp: 90 tablet, Rfl: 3   Omega-3 Fatty Acids (FISH OIL PO), Take 1 capsule by mouth daily., Disp: , Rfl:    potassium citrate (UROCIT-K) 10 MEQ (1080 MG) SR tablet, Take 10 mEq by mouth 3 (three) times daily., Disp: , Rfl:    rosuvastatin (CRESTOR) 20 MG tablet, Take 1 tablet (20 mg total) by mouth daily., Disp: 90 tablet, Rfl: 3   Semaglutide-Weight Management (WEGOVY) 2.4 MG/0.75ML SOAJ, Semaglutide: b12 with eden drug, Disp: 9 mL, Rfl: 3   Vilazodone HCl (VIIBRYD) 40 MG TABS, Take 1 tablet (40 mg total) by mouth daily., Disp: 90 tablet, Rfl: 2  Observations/Objective: Patient is well-developed, well-nourished in no acute distress.  Resting comfortably  at home.  Head is normocephalic, atraumatic.  No labored breathing.  Speech is clear and coherent with logical content.  Patient is alert and oriented at baseline.  Petechial rash on  forehead cheeks and chin   Assessment and Plan:  Sherri Birk in today with chief complaint of No chief complaint on file.   1. Petechial rash- face Nothing can do other then watch Should resolve on its own in 3-4 days RTO prn   Follow Up Instructions: I discussed the assessment and treatment plan with the patient. The patient was provided an opportunity to ask questions and all were answered. The patient agreed with the plan and demonstrated an understanding of the instructions.  A copy of instructions were sent to the patient via MyChart.  The patient was advised to call back or seek an in-person evaluation if the symptoms worsen or if the condition fails to improve as anticipated.  Time:  I spent 8 minutes with the patient via telehealth technology discussing the above problems/concerns.    Mary-Margaret Daphine Deutscher, FNP

## 2022-04-28 DIAGNOSIS — D351 Benign neoplasm of parathyroid gland: Secondary | ICD-10-CM | POA: Diagnosis not present

## 2022-04-29 LAB — COMPREHENSIVE METABOLIC PANEL
ALT: 19 IU/L (ref 0–32)
AST: 20 IU/L (ref 0–40)
Albumin/Globulin Ratio: 1.8 (ref 1.2–2.2)
Albumin: 4.4 g/dL (ref 3.9–4.9)
Alkaline Phosphatase: 73 IU/L (ref 44–121)
BUN/Creatinine Ratio: 11 — ABNORMAL LOW (ref 12–28)
BUN: 12 mg/dL (ref 8–27)
Bilirubin Total: 0.3 mg/dL (ref 0.0–1.2)
CO2: 23 mmol/L (ref 20–29)
Calcium: 9.8 mg/dL (ref 8.7–10.3)
Chloride: 107 mmol/L — ABNORMAL HIGH (ref 96–106)
Creatinine, Ser: 1.05 mg/dL — ABNORMAL HIGH (ref 0.57–1.00)
Globulin, Total: 2.4 g/dL (ref 1.5–4.5)
Glucose: 94 mg/dL (ref 70–99)
Potassium: 5 mmol/L (ref 3.5–5.2)
Sodium: 145 mmol/L — ABNORMAL HIGH (ref 134–144)
Total Protein: 6.8 g/dL (ref 6.0–8.5)
eGFR: 59 mL/min/{1.73_m2} — ABNORMAL LOW (ref >59)

## 2022-04-29 LAB — PTH, INTACT AND CALCIUM: PTH: 50 pg/mL (ref 15–65)

## 2022-05-05 ENCOUNTER — Ambulatory Visit: Payer: 59 | Admitting: "Endocrinology

## 2022-05-05 ENCOUNTER — Encounter: Payer: Self-pay | Admitting: "Endocrinology

## 2022-05-05 ENCOUNTER — Other Ambulatory Visit: Payer: Self-pay | Admitting: Family Medicine

## 2022-05-05 VITALS — BP 124/72 | HR 60 | Ht 64.0 in | Wt 237.2 lb

## 2022-05-05 DIAGNOSIS — F411 Generalized anxiety disorder: Secondary | ICD-10-CM

## 2022-05-05 DIAGNOSIS — E212 Other hyperparathyroidism: Secondary | ICD-10-CM | POA: Diagnosis not present

## 2022-05-05 DIAGNOSIS — D351 Benign neoplasm of parathyroid gland: Secondary | ICD-10-CM | POA: Diagnosis not present

## 2022-05-05 DIAGNOSIS — M8580 Other specified disorders of bone density and structure, unspecified site: Secondary | ICD-10-CM | POA: Diagnosis not present

## 2022-05-05 DIAGNOSIS — F5104 Psychophysiologic insomnia: Secondary | ICD-10-CM

## 2022-05-05 NOTE — Progress Notes (Signed)
05/05/2022, 4:50 PM  Endocrinology follow-up note  Sherri Howard is a 67 y.o.-year-old female, referred by her  Raliegh Ip, DO  . She is here to review recent work-up results after she was seen in consultation for hypercalcemia/hyperparathyroidism.   Past Medical History:  Diagnosis Date   Anemia    Anxiety    Chronic rhinitis    evaluated by dr y. Selena Batten ([facility]) note in epic 11-24-2019   Depression    DOE (dyspnea on exertion) 04-05-2020  per pt occasional gets sob sitting, lying, getting up and down, and with exertion   04-05-2020 pt pcp sent to cardiologist,  dr Jens Som for evalution, note in epic 03-28-2020 (ordered echo and cardiac CT and they have been scheduled   Endometrial polyp    First degree heart block    Headache    History of adenomatous polyp of colon    History of kidney stones    History of palpitations    previous had event monitor 2017 showed SR/ first degree heart block, no arrhythmia's and normal echo   History of seizure    ED visit 08-22-2014 in epic for possible seizure, per ed note adverse reaction to prednisone stimulant    Hyperlipidemia    Hypertension    followed by pcp   and cardiology (nuclear stress test 01-11- 2018 in epic, normal perfusion no ischemia with normal LV function and wall motion, nuclear ef 74%)   IBS (irritable bowel syndrome)    Nephrolithiasis    04-05-2020 per pt bilateral nonobstructive   OSA on CPAP    study in epic 08-31-1998 mild osa, recommended cpap   PMB (postmenopausal bleeding)    Refusal of blood transfusions as patient is Jehovah's Witness    SUI (stress urinary incontinence, female)     Past Surgical History:  Procedure Laterality Date   BREAST SURGERY  1974   lumpectomy, per pt benign   CYSTOSCOPY/RETROGRADE/URETEROSCOPY  02-01-2008  @[facility]    CYSTOSCOPY/URETEROSCOPY/HOLMIUM LASER/STENT PLACEMENT  2008;  11-07-2015 @[facility]     HYSTEROSCOPY WITH D & C N/A 04/09/2020   Procedure: DILATATION AND CURETTAGE /HYSTEROSCOPY;  Surgeon: Romualdo Bolk, MD;  Location: [facility] [place];  Service: Gynecology;  Laterality: N/A;   LAPAROSCOPIC CHOLECYSTECTOMY  2014   LAPAROSCOPIC ILEOCECECTOMY  09/07/2012   @ [facility]   W/  APPENDECTOMY (done for large cecal polyp, non-malignant)   OVARIAN CYST SURGERY  1988   unsure which side   TONSILLECTOMY  05/1973   VENTRAL HERNIA REPAIR  07-31-2014   @[facility]    incisional  (open)    Social History   Tobacco Use   Smoking status: Never   Smokeless tobacco: Never  Vaping Use   Vaping Use: Never used  Substance Use Topics   Alcohol use: Not Currently    Comment: Rare   Drug use: Never    Family History  Problem Relation Age of Onset   Cancer Mother    Aneurysm Mother    Allergic rhinitis Mother    Breast cancer Mother 41   Atrial fibrillation Mother    Cancer Father  Hypertension Father    Alzheimer's disease Father    CAD Father    Allergic rhinitis Brother    COPD Maternal Aunt    Eczema Son    Bronchitis Son    Sinusitis Son    Asthma Neg Hx    Urticaria Neg Hx    Immunodeficiency Neg Hx    Atopy Neg Hx    Angioedema Neg Hx     Outpatient Encounter Medications as of 05/05/2022  Medication Sig   loratadine (CLARITIN) 10 MG tablet Take 10 mg by mouth daily.   acetaminophen (TYLENOL) 325 MG tablet Take 650 mg by mouth every 6 (six) hours as needed for mild pain or moderate pain.    ALPRAZolam (XANAX) 0.5 MG tablet Take 0.5-1 tablets (0.25-0.5 mg total) by mouth daily as needed for anxiety (PUT ON FILE). May take extra tablet during the day prn panic.   amLODipine (NORVASC) 5 MG tablet Take 0.5 tablets (2.5 mg total) by mouth daily.   busPIRone (BUSPAR) 10 MG tablet Take 2 tablets (20 mg total) by mouth 2 (two) times daily. FOR ANXIETY   carvedilol (COREG) 6.25 MG tablet Take 1 tablet (6.25 mg total) by mouth 2 (two) times daily.   diphenhydrAMINE  (BENADRYL) 25 mg capsule Take by mouth.   famotidine (PEPCID) 20 MG tablet Take one tablet (20mg ) by mouth twice a day   hyoscyamine (LEVSIN SL) 0.125 MG SL tablet DISSOLVE 1 TABLET UNDER THE TONGUE EVERY 6 HOURS AS NEEDED   Lactobacillus (PROBIOTIC ACIDOPHILUS PO) Take 1 tablet by mouth daily.   losartan (COZAAR) 100 MG tablet Take 1 tablet (100 mg total) by mouth daily.   Omega-3 Fatty Acids (FISH OIL PO) Take 1 capsule by mouth daily.   potassium citrate (UROCIT-K) 10 MEQ (1080 MG) SR tablet Take 10 mEq by mouth 3 (three) times daily.   rosuvastatin (CRESTOR) 20 MG tablet Take 1 tablet (20 mg total) by mouth daily.   Vilazodone HCl (VIIBRYD) 40 MG TABS Take 1 tablet (40 mg total) by mouth daily.   [DISCONTINUED] ketoconazole (NIZORAL) 2 % cream Apply 1 Application topically daily. X2 weeks   [DISCONTINUED] Semaglutide-Weight Management (WEGOVY) 2.4 MG/0.75ML SOAJ Semaglutide: b12 with eden drug   No facility-administered encounter medications on file as of 05/05/2022.    Allergies  Allergen Reactions   Ciprofloxacin Rash   Clindamycin Rash   Influenza Vaccines Rash   Latex Rash   Other     PER PT JEHOVAH WITNESS, BLOOD PRODUCT REFUSAL   Temazepam Rash   Enablex [Darifenacin Hydrobromide Er] Swelling    Causes swelling of patient's tongue   Hydroxyzine Hcl     Per pt was given prior to scheduled tonsillectomy in 1975 and had convulsions under anesthesia   Prednisolone Other (See Comments)    Convulsions per pt   Amoxicillin Diarrhea   Oxycodone Rash    percocet   Pantoprazole Rash   Pneumococcal Vaccine Swelling and Rash    Lymph node swelling   Sulfa Antibiotics Rash   Tape Rash    Bandages, etc.     HPI  Sherri Howard was diagnosed with hypercalcemia in .  Patient has no previously history of nephrolithiasis.  She denies history of pituitary, thyroid, adrenal dysfunctions.  She has mild CKD which appears to be improving.  She was found to have calcium at 9.9 and 9.5  associated with high PTH of 90.   However repeat labs are showing stable calcium at 9.8 and improving PTH  at 50. She has a parathyroid scan which showed possible adenoma on the right superior gland.   After her last visit, she was sent for 24-hour urine calcium measurement which did not reveal hypercalciuria.  It was 70 mg / 24 hours. She also underwent DEXA scan which showed osteopenia of hip, normal spine and upper extremities. She was kept on expectant management without active intervention at this time. -no family history of pituitary, adrenal, thyroid  dysfunctions.  she is not on HCTZ or other thiazide therapy.  No history of  vitamin D deficiency.   she is not on calcium supplements.  -Her other medical problems include high blood pressure on medications including amlodipine, carvedilol, losartan, hyperlipidemia on Crestor, omega-3 fatty acids. Her records indicate A1c of 5.1% recently, previously 5.8%. Patient is on Wegovy 2.4 mg every 7 days. Patient believes she has lost some inches of height.  She has no new complaints today.  ROS:  Constitutional: + Minimally fluctuating body weight,  no fatigue, no subjective hyperthermia, no subjective hypothermia Eyes: no blurry vision, no xerophthalmia ENT: no sore throat, no nodules palpated in throat, no dysphagia/odynophagia, no hoarseness   PE: BP 124/72   Pulse 60   Ht  (1.626 m)   Wt 237 lb 3.2 oz (107.6 kg)   BMI 40.72 kg/m , Body mass index is 40.72 kg/m. Wt Readings from Last 3 Encounters:  05/05/22 237 lb 3.2 oz (107.6 kg)  02/03/22 237 lb (107.5 kg)  12/18/21 241 lb (109.3 kg)    Constitutional: + BMI 41.3, not in acute distress, normal state of mind Eyes: PERRLA, EOMI, no exophthalmos ENT: moist mucous membranes, no gross thyromegaly, no gross cervical lymphadenopathy    CMP ( most recent) CMP     Component Value Date/Time   NA 145 (H) 04/28/2022 1057   K 5.0 04/28/2022 1057   CL 107 (H) 04/28/2022 1057    CO2 23 04/28/2022 1057   GLUCOSE 94 04/28/2022 1057   GLUCOSE 111 (H) 04/09/2020 0920   BUN 12 04/28/2022 1057   CREATININE 1.05 (H) 04/28/2022 1057   CREATININE 1.07 (H) 03/08/2019 1540   CALCIUM 9.8 04/28/2022 1057   PROT 6.8 04/28/2022 1057   ALBUMIN 4.4 04/28/2022 1057   AST 20 04/28/2022 1057   ALT 19 04/28/2022 1057   ALKPHOS 73 04/28/2022 1057   BILITOT 0.3 04/28/2022 1057   GFRNONAA 58 (L) 11/07/2019 1229   GFRNONAA 56 (L) 03/08/2019 1540   GFRAA 67 11/07/2019 1229   GFRAA 65 03/08/2019 1540     Diabetic Labs (most recent): Lab Results  Component Value Date   HGBA1C 5.3 11/07/2019   HGBA1C 5.8 04/14/2016     Lipid Panel ( most recent) Lipid Panel     Component Value Date/Time   CHOL 111 07/02/2021 1310   CHOL 121 06/13/2020 0000   TRIG 112 07/02/2021 1310   HDL 52 07/02/2021 1310   HDL 53 06/13/2020 0000   CHOLHDL 2.1 07/02/2021 1310   LDLCALC 39 07/02/2021 1310   LABVLDL 20 06/13/2020 0000      Lab Results  Component Value Date   TSH 1.190 11/07/2019   TSH 3.350 03/23/2019   TSH 3.060 06/04/2015   FREET4 0.89 06/04/2015     July 02, 2021 labs: Calcium 9.9, BUN 12, creatinine 1.14, albumin 4.4, PTH 90.  Jun 01, 2021 vitamin B12 430.  Recent Results (from the past 2160 hour(s))  PTH, intact and calcium     Status: None  Collection Time: 04/28/22 10:57 AM  Result Value Ref Range   PTH 50 15 - 65 pg/mL   PTH Interp Comment     Comment: Interpretation                 Intact PTH    Calcium                                 (pg/mL)      (mg/dL) Normal                          15 - 65     8.6 - 10.2 Primary Hyperparathyroidism         >65          >10.2 Secondary Hyperparathyroidism       >65          <10.2 Non-Parathyroid Hypercalcemia       <65          >10.2 Hypoparathyroidism                  <15          < 8.6 Non-Parathyroid Hypocalcemia    15 - 65          < 8.6   Comprehensive metabolic panel     Status: Abnormal   Collection Time:  04/28/22 10:57 AM  Result Value Ref Range   Glucose 94 70 - 99 mg/dL   BUN 12 8 - 27 mg/dL   Creatinine, Ser 1.61 (H) 0.57 - 1.00 mg/dL   eGFR 59 (L) >09 UE/AVW/0.98   BUN/Creatinine Ratio 11 (L) 12 - 28   Sodium 145 (H) 134 - 144 mmol/L   Potassium 5.0 3.5 - 5.2 mmol/L   Chloride 107 (H) 96 - 106 mmol/L   CO2 23 20 - 29 mmol/L   Calcium 9.8 8.7 - 10.3 mg/dL   Total Protein 6.8 6.0 - 8.5 g/dL   Albumin 4.4 3.9 - 4.9 g/dL   Globulin, Total 2.4 1.5 - 4.5 g/dL   Albumin/Globulin Ratio 1.8 1.2 - 2.2   Bilirubin Total 0.3 0.0 - 1.2 mg/dL   Alkaline Phosphatase 73 44 - 121 IU/L   AST 20 0 - 40 IU/L   ALT 19 0 - 32 IU/L   Bone density on September 30, 2021: [facility] Spine L1-L4 09/30/2021 64.0 Normal 0.3 1.214 g/cm2 -   DualFemur Neck Left 09/30/2021 64.0 Osteopenia -2.1 0.743 g/cm2 - -   DualFemur Total Mean 09/30/2021 64.0 Normal -0.9 0.898 g/cm2 - -   Left Forearm Radius 33% 09/30/2021 64.0 Normal 0.6 0.757 g/cm2 - - ASSESSMENT: The BMD measured at Femur Neck Left is 0.743 g/cm2 with a T-score of -2.1. This patient is considered osteopenic according to World Health Organization [facility]) criteria. The scan quality is good.  Assessment: 1. Hyperparathyroidism 2.  Parathyroid adenoma 3.  Osteopenia of hip  Plan: I reviewed her existing as well as new labs/bone density results with her and her husband.   The findings of is documentation of high PTH 90 on the background of mild CKD without significant hypercalcemia nor hypercalciuria.  For repeat labs showing stable calcium at 9.8 and PTH improving to 50. She has osteopenia of hip and history of nephrolithiasis. -A diagnosis of primary hyperparathyroidism is still possible due to her positive nuclear medicine scan showing possible right superior parathyroid adenoma. -However, she  does not have enough criteria for surgical intervention at this time. Reports history of calcium based  nephrolithiasis. She will be on expectant management for  another year.  She will return with PTH/calcium, CMP as well as repeat bone density.    - I discussed with the patient about the physiology of calcium and parathyroid hormone, and possible  effects of  increased PTH/ Calcium , including kidney stones, cardiac dysrhythmias, osteoporosis, abdominal pain, etc.    If her results are next visit confirm primary hyperparathyroidism,  she may be offered surgical treatment.  If she hesitates or would like to avoid surgery, she will be considered for intervention with Sensipar.    The couple is exploring options of weight control, currently on Wegovy.  She believes she is doing very well.  She was offered discussion for lifestyle medicine, however she opted to delay it for now.   She is advised to maintain close follow-up follow-up with her PCP as well as nephrologist.  I spent  22  minutes in the care of the patient today including review of labs from Thyroid Function, CMP, and other relevant labs ; imaging/biopsy records (current and previous including abstractions from other facilities); face-to-face time discussing  her lab results and symptoms, medications doses, her options of short and long term treatment based on the latest standards of care / guidelines;   and documenting the encounter.  Elijah Birk  participated in the discussions, expressed understanding, and voiced agreement with the above plans.  All questions were answered to her satisfaction. she is encouraged to contact clinic should she have any questions or concerns prior to her return visit.  - Return in about 1 year (around 05/05/2023), or Bone density in March 2025, for DXA Scan B4 NV.   Marquis Lunch, MD [facility] [address] [place], [place] [address] Phone: [phone]  Fax: [phone]    This note was partially dictated with voice recognition software. Similar sounding words can be transcribed inadequately or may not  be  corrected upon review.  05/05/2022, 4:50 PM

## 2022-05-06 ENCOUNTER — Encounter: Payer: Self-pay | Admitting: Family Medicine

## 2022-05-20 DIAGNOSIS — N3091 Cystitis, unspecified with hematuria: Secondary | ICD-10-CM | POA: Diagnosis not present

## 2022-05-20 DIAGNOSIS — R8290 Unspecified abnormal findings in urine: Secondary | ICD-10-CM | POA: Diagnosis not present

## 2022-05-20 DIAGNOSIS — N200 Calculus of kidney: Secondary | ICD-10-CM | POA: Diagnosis not present

## 2022-05-20 DIAGNOSIS — R809 Proteinuria, unspecified: Secondary | ICD-10-CM | POA: Diagnosis not present

## 2022-05-20 DIAGNOSIS — N202 Calculus of kidney with calculus of ureter: Secondary | ICD-10-CM | POA: Diagnosis not present

## 2022-05-20 DIAGNOSIS — N1831 Chronic kidney disease, stage 3a: Secondary | ICD-10-CM | POA: Diagnosis not present

## 2022-06-06 ENCOUNTER — Encounter: Payer: Self-pay | Admitting: Family Medicine

## 2022-06-13 ENCOUNTER — Ambulatory Visit (INDEPENDENT_AMBULATORY_CARE_PROVIDER_SITE_OTHER): Payer: 59 | Admitting: Family Medicine

## 2022-06-13 ENCOUNTER — Encounter: Payer: Self-pay | Admitting: Family Medicine

## 2022-06-13 ENCOUNTER — Other Ambulatory Visit (HOSPITAL_COMMUNITY)
Admission: RE | Admit: 2022-06-13 | Discharge: 2022-06-13 | Disposition: A | Discharge status: Home / Self Care | Payer: 59 | Source: Ambulatory Visit | Attending: Family Medicine | Admitting: Family Medicine

## 2022-06-13 VITALS — BP 120/70 | HR 63 | Temp 98.5°F | Ht 64.0 in | Wt 239.0 lb

## 2022-06-13 DIAGNOSIS — Z79899 Other long term (current) drug therapy: Secondary | ICD-10-CM | POA: Diagnosis not present

## 2022-06-13 DIAGNOSIS — Z124 Encounter for screening for malignant neoplasm of cervix: Secondary | ICD-10-CM

## 2022-06-13 DIAGNOSIS — Z0001 Encounter for general adult medical examination with abnormal findings: Secondary | ICD-10-CM | POA: Diagnosis not present

## 2022-06-13 DIAGNOSIS — G4733 Obstructive sleep apnea (adult) (pediatric): Secondary | ICD-10-CM | POA: Diagnosis not present

## 2022-06-13 DIAGNOSIS — Z713 Dietary counseling and surveillance: Secondary | ICD-10-CM | POA: Diagnosis not present

## 2022-06-13 DIAGNOSIS — Z0000 Encounter for general adult medical examination without abnormal findings: Secondary | ICD-10-CM

## 2022-06-13 DIAGNOSIS — I129 Hypertensive chronic kidney disease with stage 1 through stage 4 chronic kidney disease, or unspecified chronic kidney disease: Secondary | ICD-10-CM | POA: Diagnosis not present

## 2022-06-13 DIAGNOSIS — F411 Generalized anxiety disorder: Secondary | ICD-10-CM

## 2022-06-13 DIAGNOSIS — Z6841 Body Mass Index (BMI) 40.0 and over, adult: Secondary | ICD-10-CM

## 2022-06-13 DIAGNOSIS — F5104 Psychophysiologic insomnia: Secondary | ICD-10-CM | POA: Diagnosis not present

## 2022-06-13 DIAGNOSIS — N1831 Chronic kidney disease, stage 3a: Secondary | ICD-10-CM

## 2022-06-13 DIAGNOSIS — I10 Essential (primary) hypertension: Secondary | ICD-10-CM

## 2022-06-13 LAB — CBC
RBC: 4.69 x10E6/uL (ref 3.77–5.28)
RDW: 13.1 % (ref 11.7–15.4)
WBC: 6.9 10*3/uL (ref 3.4–10.8)

## 2022-06-13 LAB — BAYER DCA HB A1C WAIVED: HB A1C (BAYER DCA - WAIVED): 5.2 % (ref 4.8–5.6)

## 2022-06-13 MED ORDER — HYOSCYAMINE SULFATE 0.125 MG SL SUBL: SUBLINGUAL_TABLET | SUBLINGUAL | 99 refills | Status: AC

## 2022-06-13 MED ORDER — ALPRAZOLAM 0.5 MG PO TABS
0.2500 mg | ORAL_TABLET | Freq: Every day | ORAL | 5 refills | Status: DC | PRN
Start: 2022-06-13 — End: 2022-11-14

## 2022-06-13 MED ORDER — WEGOVY 0.5 MG/0.5ML ~~LOC~~ SOAJ
SUBCUTANEOUS | 0 refills | Status: DC
Start: 2022-06-13 — End: 2022-11-13

## 2022-06-13 NOTE — Progress Notes (Signed)
Sherri Howard is a 67 y.o. female presents to office today for annual physical exam examination.    Concerns today include: 1.  Hypertension, hyperlipidemia and coronary artery calcification associated with morbid obesity Patient continues to struggle with weight and this weighs on her spirit quite a bit.  It certainly exacerbates her anxiety and depressive symptoms.  She continues to use her medications as directed.  She often does not feel good about herself.  She admits that she does not want to discuss it too much because it causes her to become upset.  She is still interested in GLP but her insurance will not cover.  She does report some intermittent dizziness and wonders if perhaps it is because her heart rate is going too low.  Marital status: married, Substance use: none Diet: high sugar, Exercise: no structured Immunizations needed: Immunization History  Administered Date(s) Administered   Influenza,inj,Quad PF,6+ Mos 11/29/2019   Influenza-Unspecified 10/18/2020, 09/30/2021, 09/30/2021   PFIZER(Purple Top)SARS-COV-2 Vaccination 04/11/2019, 04/29/2019, 11/25/2019, 06/08/2020, 09/30/2021   Pneumococcal Conjugate-13 09/25/2010   Td 04/08/1999   Tdap 04/08/1999, 05/24/2012     Past Medical History:  Diagnosis Date   Anemia    Anxiety    Chronic rhinitis    evaluated by dr y. Selena [facility] ([facility] note in epic 11-24-2019   Depression    DOE (dyspnea on exertion) 04-05-2020  per pt occasional gets sob sitting, lying, getting up and down, and with exertion   04-05-2020 pt pcp sent to cardiologist,  dr Jens Som for evalution, note in epic 03-28-2020 (ordered echo and cardiac CT and they have been scheduled   Endometrial polyp    First degree heart block    Headache    History of adenomatous polyp of colon    History of kidney stones    History of palpitations    previous had event monitor 2017 showed SR/ first degree heart block, no arrhythmia's and normal echo    History of seizure    ED visit 08-22-2014 in epic for possible seizure, per ed note adverse reaction to prednisone stimulant    Hyperlipidemia    Hypertension    followed by pcp   and cardiology (nuclear stress test 01-11- 2018 in epic, normal perfusion no ischemia with normal LV function and wall motion, nuclear ef 74%)   IBS (irritable bowel syndrome)    Nephrolithiasis    04-05-2020 per pt bilateral nonobstructive   OSA on CPAP    study in epic 08-31-1998 mild osa, recommended cpap   PMB (postmenopausal bleeding)    Refusal of blood transfusions as patient is Jehovah's Witness    Right ureteral calculus 11/07/2015   S/P herniorrhaphy 07/31/2014   SUI (stress urinary incontinence, female)    Social History   Socioeconomic History   Marital status: Married    Spouse name: Not on file   Number of children: 1   Years of education: Not on file   Highest education level: Not on file  Occupational History   Not on file  Tobacco Use   Smoking status: Never   Smokeless tobacco: Never  Vaping Use   Vaping Use: Never used  Substance and Sexual Activity   Alcohol use: Not Currently    Comment: Rare   Drug use: Never   Sexual activity: Not on file  Other Topics Concern   Not on file  Social History Narrative   Not on file   Social Determinants of Health   Financial Resource  Strain: Not on file  Food Insecurity: Not on file  Transportation Needs: Not on file  Physical Activity: Not on file  Stress: Not on file  Social Connections: Not on file  Intimate Partner Violence: Not on file   Past Surgical History:  Procedure Laterality Date   BREAST SURGERY  1974   lumpectomy, per pt benign   CYSTOSCOPY/RETROGRADE/URETEROSCOPY  02-01-2008  @[facility]    CYSTOSCOPY/URETEROSCOPY/HOLMIUM LASER/STENT PLACEMENT  2008;  11-07-2015 @[facility]    HYSTEROSCOPY WITH D & C N/A 04/09/2020   Procedure: DILATATION AND CURETTAGE /HYSTEROSCOPY;  Surgeon: Romualdo Bolk, MD;  Location: [facility];  Service: Gynecology;  Laterality: N/A;   LAPAROSCOPIC CHOLECYSTECTOMY  2014   LAPAROSCOPIC ILEOCECECTOMY  09/07/2012   @ [facility]   W/  APPENDECTOMY (done for large cecal polyp, non-malignant)   OVARIAN CYST SURGERY  1988   unsure which side   TONSILLECTOMY  05/1973   VENTRAL HERNIA REPAIR  07-31-2014   @[facility]    incisional  (open)   Family History  Problem Relation Age of Onset   Cancer Mother    Aneurysm Mother    Allergic rhinitis Mother    Breast cancer Mother 20   Atrial fibrillation Mother    Cancer Father    Hypertension Father    Alzheimer's disease Father    CAD Father    Allergic rhinitis Brother    COPD Maternal Aunt    Eczema Son    Bronchitis Son    Sinusitis Son    Asthma Neg Hx    Urticaria Neg Hx    Immunodeficiency Neg Hx    Atopy Neg Hx    Angioedema Neg Hx     Current Outpatient Medications:    acetaminophen (TYLENOL) 325 MG tablet, Take 650 mg by mouth every 6 (six) hours as needed for mild pain or moderate pain. , Disp: , Rfl:    ALPRAZolam (XANAX) 0.5 MG tablet, Take 0.5-1 tablets (0.25-0.5 mg total) by mouth daily as needed for anxiety (PUT ON FILE). May take extra tablet during the day prn panic., Disp: 45 tablet, Rfl: 5   amLODipine (NORVASC) 5 MG tablet, Take 0.5 tablets (2.5 mg total) by mouth daily., Disp: 90 tablet, Rfl: 3   busPIRone (BUSPAR) 10 MG tablet, Take 2 tablets (20 mg total) by mouth 2 (two) times daily. FOR ANXIETY, Disp: 360 tablet, Rfl: 2   carvedilol (COREG) 6.25 MG tablet, Take 1 tablet (6.25 mg total) by mouth 2 (two) times daily., Disp: 180 tablet, Rfl: 3   diphenhydrAMINE (BENADRYL) 25 mg capsule, Take by mouth., Disp: , Rfl:    famotidine (PEPCID) 20 MG tablet, Take one tablet (20mg ) by mouth twice a day, Disp: 180 tablet, Rfl: 3   hyoscyamine (LEVSIN SL) 0.125 MG SL tablet, DISSOLVE 1 TABLET UNDER THE TONGUE EVERY 6 HOURS AS NEEDED, Disp: 30 tablet, Rfl: 0   Lactobacillus (PROBIOTIC ACIDOPHILUS PO), Take 1 tablet  by mouth daily., Disp: , Rfl:    loratadine (CLARITIN) 10 MG tablet, Take 10 mg by mouth daily., Disp: , Rfl:    losartan (COZAAR) 100 MG tablet, Take 1 tablet (100 mg total) by mouth daily., Disp: 90 tablet, Rfl: 3   Omega-3 Fatty Acids (FISH OIL PO), Take 1 capsule by mouth daily., Disp: , Rfl:    potassium citrate (UROCIT-K) 10 MEQ (1080 MG) SR tablet, Take 10 mEq by mouth 3 (three) times daily., Disp: , Rfl:    rosuvastatin (CRESTOR) 20 MG tablet, Take 1 tablet (20  mg total) by mouth daily., Disp: 90 tablet, Rfl: 3   Vilazodone HCl (VIIBRYD) 40 MG TABS, Take 1 tablet (40 mg total) by mouth daily., Disp: 90 tablet, Rfl: 2  Allergies  Allergen Reactions   Ciprofloxacin Rash   Clindamycin Rash   Influenza Vaccines Rash   Latex Rash   Other     PER PT JEHOVAH WITNESS, BLOOD PRODUCT REFUSAL   Temazepam Rash   Enablex [Darifenacin Hydrobromide Er] Swelling    Causes swelling of patient's tongue   Hydroxyzine Hcl     Per pt was given prior to scheduled tonsillectomy in 1975 and had convulsions under anesthesia   Prednisolone Other (See Comments)    Convulsions per pt   Amoxicillin Diarrhea   Oxycodone Rash    percocet   Pantoprazole Rash   Pneumococcal Vaccine Swelling and Rash    Lymph node swelling   Sulfa Antibiotics Rash   Tape Rash    Bandages, etc.     ROS: Review of Systems A comprehensive review of systems was negative except for: Integument/breast: positive for rash Behavioral/Psych: positive for anxiety, depression, obesity, and sleep disturbance    Physical exam BP 120/70   Pulse 63   Temp 98.5 F (36.9 C)   Ht 5\' 4"  (1.626 m)   Wt 239 lb (108.4 kg)   SpO2 96%   BMI 41.02 kg/m  General appearance: alert, cooperative, appears stated age, no distress, and morbidly obese Head: Normocephalic, without obvious abnormality, atraumatic Eyes: negative findings: conjunctivae and sclerae normal, corneas clear, pupils equal, round, reactive to light and accomodation,  and postinflammatory changes to bilateral lids' skin Ears: normal TM's and external ear canals both ears Nose: Nares normal. Septum midline. Mucosa normal. No drainage or sinus tenderness. Throat: lips, mucosa, and tongue normal; teeth and gums normal Neck: no adenopathy, no carotid bruit, supple, symmetrical, trachea midline, and thyroid not enlarged, symmetric, no tenderness/mass/nodules Back: symmetric, no curvature. ROM normal. No CVA tenderness. Lungs: clear to auscultation bilaterally Heart: regular rate and rhythm, S1, S2 normal, no murmur, click, rub or gallop Abdomen: soft, non-tender; bowel sounds normal; no masses,  no organomegaly Pelvic: cervix normal in appearance, external genitalia normal, no adnexal masses or tenderness, no cervical motion tenderness, rectovaginal septum normal, uterus normal size, shape, and consistency, and vagina normal without discharge Extremities: extremities normal, atraumatic, no cyanosis or edema Pulses: 2+ and symmetric Skin:  No suspicious moles.  She has inflammatory changes to the skin of the face noted as above Lymph nodes: Cervical, supraclavicular, and axillary nodes normal. Neurologic: Grossly normal Psych: Anxious when talking about her weight.  She is pleasant, interactive.     02/03/2022    2:57 PM 12/18/2021    3:01 PM 11/04/2021   11:16 AM  Depression screen PHQ 2/9  Decreased Interest 1 1 3   Down, Depressed, Hopeless 1 0 3  PHQ - 2 Score 2 1 6   Altered sleeping 1 3 3   Tired, decreased energy 2 2 3   Change in appetite 1 3 2   Feeling bad or failure about yourself  1 2 2   Trouble concentrating 1 2 2   Moving slowly or fidgety/restless 1 1 1   Suicidal thoughts 1 1 1   PHQ-9 Score 10 15 20   Difficult doing work/chores  Somewhat difficult Somewhat difficult      02/03/2022    2:56 PM 12/18/2021    3:02 PM 11/04/2021   11:16 AM 07/31/2021   12:10 PM  GAD 7 : Generalized Anxiety Score  Nervous, Anxious, on Edge 2 1 2 2    Control/stop worrying 2 1 1 1   Worry too much - different things 2 1 1 1   Trouble relaxing 3 1 1 2   Restless 2 1 1 1   Easily annoyed or irritable 2 1 1  0  Afraid - awful might happen 0 1 1 1   Total GAD 7 Score 13 7 8 8   Anxiety Difficulty  Not difficult at all Somewhat difficult Somewhat difficult       06/13/2022   10:25 AM 04/03/2016    1:12 PM  MMSE - Mini Mental State Exam  Orientation to time 5 5  Orientation to Place 5 5  Registration 3 3  Attention/ Calculation 5 5  Recall 3 3  Language- name 2 objects 2 2  Language- repeat 1 1  Language- follow 3 step command 3 3  Language- read & follow direction 1 1  Write a sentence 1 1  Copy design 1 1  Total score 30 30     Assessment/ Plan: Sherri Howard here for annual physical exam.   Annual physical exam  Screening for malignant neoplasm of cervix - Plan: Cytology - PAP  Weight loss counseling, encounter for  Morbid obesity (HCC) - Plan: Bayer DCA Hb A1c Waived, TSH, Lipid Panel, Semaglutide-Weight Management (WEGOVY) 0.5 MG/0.5ML SOAJ  Benign essential hypertension - Plan: Semaglutide-Weight Management (WEGOVY) 0.5 MG/0.5ML SOAJ  OSA on CPAP  Chronic kidney disease, stage 3a (HCC) - Plan: VITAMIN D 25 Hydroxy (Vit-D Deficiency, Fractures), CBC, Semaglutide-Weight Management (WEGOVY) 0.5 MG/0.5ML SOAJ  Generalized anxiety disorder - Plan: ToxASSURE Select 13 (MW), Urine, ALPRAZolam (XANAX) 0.5 MG tablet  Psychophysiological insomnia - Plan: ToxASSURE Select 13 (MW), Urine, ALPRAZolam (XANAX) 0.5 MG tablet  Controlled substance agreement signed - Plan: ToxASSURE Select 13 (MW), Urine  Last Pap smear obtained by Dr. Oscar La, who is now retired.  Blood pressure is controlled.  No changes  Continue CPAP machine.  We discussed options for treatment of morbid obesity and she would like to proceed with prescription for semaglutide.  I sent this to [facility] drug.  Discussed risk versus benefits.  No apparent  contraindications to use of medication.  Discussed exercising following meals for at least 15-20 minutes.  Check vitamin D, CBC.  Not yet due for renal function testing  UDS and CSC were updated as per office policy.  It sounds like her mental health is highly tied into her physical appearance.  I think that she would probably benefit from counseling at some point for body dysmorphia but we will make small steps in trying to help her achieve some weight loss goals before proceeding with this.  National narcotic database reviewed and there were no red flags.  Continue to utilize the alprazolam sparingly.  MMSE demonstrated no appreciable signs of dementia.  Discussed appropriate sleep hygiene.  Reduce bluelight exposure etc.   Counseled on healthy lifestyle choices, including diet (rich in fruits, vegetables and lean meats and low in salt and simple carbohydrates) and exercise (at least 30 minutes of moderate physical activity daily).  Patient to follow up in 3-6 months for mood/ weight check  Jasun Gasparini M. Nadine Counts, DO

## 2022-06-14 LAB — CBC
Hematocrit: 41.8 % (ref 34.0–46.6)
Hemoglobin: 13.9 g/dL (ref 11.1–15.9)
MCH: 29.6 pg (ref 26.6–33.0)
MCHC: 33.3 g/dL (ref 31.5–35.7)
MCV: 89 fL (ref 79–97)
Platelets: 282 10*3/uL (ref 150–450)

## 2022-06-14 LAB — LIPID PANEL
Chol/HDL Ratio: 2.1 ratio (ref 0.0–4.4)
Cholesterol, Total: 139 mg/dL (ref 100–199)
HDL: 65 mg/dL (ref >39)
LDL Chol Calc (NIH): 58 mg/dL (ref 0–99)
Triglycerides: 87 mg/dL (ref 0–149)
VLDL Cholesterol Cal: 16 mg/dL (ref 5–40)

## 2022-06-14 LAB — TSH: TSH: 3.08 u[IU]/mL (ref 0.450–4.500)

## 2022-06-14 LAB — VITAMIN D 25 HYDROXY (VIT D DEFICIENCY, FRACTURES): Vit D, 25-Hydroxy: 40.7 ng/mL (ref 30.0–100.0)

## 2022-06-17 LAB — TOXASSURE SELECT 13 (MW), URINE

## 2022-06-18 ENCOUNTER — Encounter: Payer: Self-pay | Admitting: Family Medicine

## 2022-06-19 LAB — CYTOLOGY - PAP
Adequacy: ABSENT
Comment: NEGATIVE
Diagnosis: UNDETERMINED — AB
High risk HPV: NEGATIVE

## 2022-06-23 ENCOUNTER — Telehealth: Payer: Self-pay | Admitting: Family Medicine

## 2022-06-23 NOTE — Telephone Encounter (Signed)
lmtcb

## 2022-06-23 NOTE — Telephone Encounter (Signed)
Patient calling to discuss labs from 5/31, said she is aware that there were abnormalities and would like to speak to nurse about them. Please call back.

## 2022-06-24 NOTE — Telephone Encounter (Signed)
Pt r/c.

## 2022-07-01 ENCOUNTER — Other Ambulatory Visit: Payer: Self-pay | Admitting: Family Medicine

## 2022-07-01 ENCOUNTER — Other Ambulatory Visit: Payer: Self-pay | Admitting: Cardiology

## 2022-07-01 DIAGNOSIS — I10 Essential (primary) hypertension: Secondary | ICD-10-CM

## 2022-09-22 ENCOUNTER — Encounter: Payer: Self-pay | Admitting: Family Medicine

## 2022-09-22 DIAGNOSIS — F411 Generalized anxiety disorder: Secondary | ICD-10-CM

## 2022-09-23 MED ORDER — BUSPIRONE HCL 10 MG PO TABS
20.0000 mg | ORAL_TABLET | Freq: Two times a day (BID) | ORAL | 0 refills | Status: DC
Start: 2022-09-23 — End: 2023-02-02

## 2022-09-23 MED ORDER — POTASSIUM CITRATE ER 10 MEQ (1080 MG) PO TBCR: 10.0000 meq | EXTENDED_RELEASE_TABLET | Freq: Three times a day (TID) | ORAL | 0 refills | Status: DC

## 2022-10-09 ENCOUNTER — Ambulatory Visit: Payer: 59 | Admitting: Adult Health

## 2022-10-23 ENCOUNTER — Encounter: Payer: Self-pay | Admitting: Family Medicine

## 2022-10-24 ENCOUNTER — Other Ambulatory Visit: Payer: Self-pay

## 2022-10-24 DIAGNOSIS — F411 Generalized anxiety disorder: Secondary | ICD-10-CM

## 2022-10-24 DIAGNOSIS — I10 Essential (primary) hypertension: Secondary | ICD-10-CM

## 2022-10-24 MED ORDER — AMLODIPINE BESYLATE 5 MG PO TABS
2.5000 mg | ORAL_TABLET | Freq: Every day | ORAL | Status: DC
Start: 2022-10-24 — End: 2022-10-28

## 2022-10-24 MED ORDER — VILAZODONE HCL 40 MG PO TABS
40.0000 mg | ORAL_TABLET | Freq: Every day | ORAL | 2 refills | Status: DC
Start: 2022-10-24 — End: 2023-04-13

## 2022-10-27 ENCOUNTER — Other Ambulatory Visit: Payer: Self-pay

## 2022-10-27 DIAGNOSIS — I10 Essential (primary) hypertension: Secondary | ICD-10-CM

## 2022-10-28 ENCOUNTER — Other Ambulatory Visit: Payer: Self-pay

## 2022-10-28 DIAGNOSIS — I10 Essential (primary) hypertension: Secondary | ICD-10-CM

## 2022-10-28 MED ORDER — AMLODIPINE BESYLATE 5 MG PO TABS
2.5000 mg | ORAL_TABLET | Freq: Every day | ORAL | 1 refills | Status: DC
Start: 2022-10-28 — End: 2022-11-13

## 2022-11-07 ENCOUNTER — Ambulatory Visit: Payer: 59 | Admitting: Adult Health

## 2022-11-12 NOTE — Progress Notes (Unsigned)
Cardiology Office Note:  .   Date:  11/13/2022  ID:  Sherri Howard, DOB 1956/01/13, MRN [id number] PCP: Raliegh Ip, DO  [facility] Providers Cardiologist:  Olga Millers, MD  }   History of Present Illness: .    Sherri Howard is a 67 y.o. female with history of chronic dyspnea, normal LV EF per echo and minimally elevated calcium score thought to be related to obesity hypoventilation and sleep apnea, hyperlipidemia, with coronary calcium score 4 with trivial aortic atherosclerosis noted along with left main score of 4.  Last seen by Dr. Jens Som on 04/24/2020.   She comes today with complaints of worsening shortness of breath.  They have recently been at the beach and she states that she did a lot of walking but when she was walking to the deep sand she had to stop several times in order to catch her breath.  She denies chest pain, she does have some dizziness with position changes, no syncope.  She had been on GLP-1 but was unable to afford continued use and is put on approximately 20 pounds which she states has added to the shortness of breath at times.  Since being seen last, she is now being followed by nephrology for chronic kidney disease stage III.  Labs are completed by nephrology frequently.  She also has complaints of a mild sore throat and is going to see her PCP tomorrow.  ROS: As above otherwise negative.  Studies Reviewed: .   Echocardiogram 04/24/2020 1. Left ventricular ejection fraction, by estimation, is 60 to 65%. The  left ventricle has normal function. The left ventricle has no regional  wall motion abnormalities. There is mild left ventricular hypertrophy.  Left ventricular diastolic parameters  are consistent with Grade I diastolic dysfunction (impaired relaxation).   2. Right ventricular systolic function is normal. The right ventricular  size is normal. Tricuspid regurgitation signal is inadequate for assessing  PA pressure.   3. The mitral valve  is grossly normal. Trivial mitral valve  regurgitation.   4. The aortic valve is tricuspid. Aortic valve regurgitation is trivial.   5. The inferior vena cava is normal in size with greater than 50%  respiratory variability, suggesting right atrial pressure of 3 mmHg.    EKG Interpretation Date/Time:  Thursday November 13 2022 14:06:04 EDT Ventricular Rate:  56 PR Interval:  204 QRS Duration:  80 QT Interval:  398 QTC Calculation: 384 R Axis:   100  Text Interpretation: Sinus bradycardia with sinus arrhythmia Rightward axis Possible Inferior infarct , age undetermined Cannot rule out Anterior infarct , age undetermined When compared with ECG of 09-Oct-2016 19:02, PREVIOUS ECG IS PRESENT Confirmed by Joni Reining [phone]) on 11/13/2022 3:29:11 PM    Physical Exam:   VS:  BP 90/60   Pulse (!) 59   Ht 5\' 5"  (1.651 m)   Wt 254 lb (115.2 kg)   SpO2 97%   BMI 42.27 kg/m    Wt Readings from Last 3 Encounters:  11/13/22 254 lb (115.2 kg)  06/13/22 239 lb (108.4 kg)  05/05/22 237 lb 3.2 oz (107.6 kg)    GEN: Well nourished, well developed in no acute distress NECK: No JVD; No carotid bruits CARDIAC: RRR, no murmurs, rubs, gallops RESPIRATORY:  Clear to auscultation without rales, wheezing or rhonchi  ABDOMEN: Soft, non-tender, non-distended EXTREMITIES:  No edema; No deformity   ASSESSMENT AND PLAN: .    Hypertension: Blood pressure was low initially on rooming  the patient, I have rechecked it and it is 112/58.  She is having some lightheadedness and dizziness.  I am going to discontinue amlodipine 2.5 mg daily in hopes that this will help her blood pressure be a little higher to prevent dizziness.  Will not make any other changes would continue are her on ARB for renal protection and blood pressure control as well as carvedilol 6.25 mg twice daily.  Prescriptions are refilled on this medication.  After remains slightly hypotensive may titrate down carvedilol but will await  follow-up.  Echocardiogram will be completed for comparison in the setting of hypertension.  2.  Hypercholesterolemia: Remains on rosuvastatin 20 mg daily.  This is followed by PCP.  On review of last labs in May 2024 she was very well-controlled.  Will not make any changes at this time.      3.  OSA: Has not tolerated CPAP.  Follow-up with PCP for further recommendations.  4.  Chronic kidney disease stage III: Followed by nephrology.  Agree with ARB for renal protection.  Labs are followed by nephrology as well.  Last creatinine 1.09, with GFR of 59.   Signed, Bettey Mare. Liborio Nixon, ANP, AACC

## 2022-11-13 ENCOUNTER — Ambulatory Visit: Payer: Medicare HMO | Attending: Adult Health | Admitting: Adult Health

## 2022-11-13 ENCOUNTER — Encounter: Payer: Self-pay | Admitting: Adult Health

## 2022-11-13 ENCOUNTER — Other Ambulatory Visit: Payer: Medicare HMO

## 2022-11-13 ENCOUNTER — Other Ambulatory Visit: Payer: Self-pay | Admitting: Family Medicine

## 2022-11-13 VITALS — BP 90/60 | HR 59 | Ht 65.0 in | Wt 254.0 lb

## 2022-11-13 DIAGNOSIS — E7800 Pure hypercholesterolemia, unspecified: Secondary | ICD-10-CM

## 2022-11-13 DIAGNOSIS — D631 Anemia in chronic kidney disease: Secondary | ICD-10-CM | POA: Diagnosis not present

## 2022-11-13 DIAGNOSIS — E211 Secondary hyperparathyroidism, not elsewhere classified: Secondary | ICD-10-CM | POA: Diagnosis not present

## 2022-11-13 DIAGNOSIS — N1831 Chronic kidney disease, stage 3a: Secondary | ICD-10-CM

## 2022-11-13 DIAGNOSIS — F411 Generalized anxiety disorder: Secondary | ICD-10-CM

## 2022-11-13 DIAGNOSIS — R809 Proteinuria, unspecified: Secondary | ICD-10-CM | POA: Diagnosis not present

## 2022-11-13 DIAGNOSIS — F5104 Psychophysiologic insomnia: Secondary | ICD-10-CM

## 2022-11-13 DIAGNOSIS — I10 Essential (primary) hypertension: Secondary | ICD-10-CM | POA: Diagnosis not present

## 2022-11-13 DIAGNOSIS — N189 Chronic kidney disease, unspecified: Secondary | ICD-10-CM | POA: Diagnosis not present

## 2022-11-13 MED ORDER — ROSUVASTATIN CALCIUM 20 MG PO TABS: 20.0000 mg | ORAL_TABLET | Freq: Every day | ORAL | 1 refills | Status: DC

## 2022-11-13 MED ORDER — CARVEDILOL 6.25 MG PO TABS: 6.2500 mg | ORAL_TABLET | Freq: Two times a day (BID) | ORAL | 3 refills | Status: DC

## 2022-11-13 NOTE — Patient Instructions (Signed)
Medication Instructions:  Stop Amlodipine *If you need a refill on your cardiac medications before your next appointment, please call your pharmacy*   Lab Work: No Labs If you have labs (blood work) drawn today and your tests are completely normal, you will receive your results only by: MyChart Message (if you have MyChart) OR A paper copy in the mail If you have any lab test that is abnormal or we need to change your treatment, we will call you to review the results.   Testing/Procedures:[address], [address]. Your physician has requested that you have an echocardiogram. Echocardiography is a painless test that uses sound waves to create images of your heart. It provides your doctor with information about the size and shape of your heart and how well your heart's chambers and valves are working. This procedure takes approximately one hour. There are no restrictions for this procedure. Please do NOT wear cologne, perfume, aftershave, or lotions (deodorant is allowed). Please arrive 15 minutes prior to your appointment time.    Follow-Up: At [facility], you and your health needs are our priority.  As part of our continuing mission to provide you with exceptional heart care, we have created designated Provider Care Teams.  These Care Teams include your primary Cardiologist (physician) and Advanced Practice Providers (APPs -  Physician Assistants and Nurse Practitioners) who all work together to provide you with the care you need, when you need it.  We recommend signing up for the patient portal called "MyChart".  Sign up information is provided on this After Visit Summary.  MyChart is used to connect with patients for Virtual Visits (Telemedicine).  Patients are able to view lab/test results, encounter notes, upcoming appointments, etc.  Non-urgent messages can be sent to your provider as well.   To learn more about what you can do with MyChart, go to  [email or web address].    Your next appointment:   3 month(s) (Doctor Only)  Provider:   Olga Millers, MD

## 2022-11-14 ENCOUNTER — Encounter: Payer: Self-pay | Admitting: Family Medicine

## 2022-11-14 ENCOUNTER — Ambulatory Visit (INDEPENDENT_AMBULATORY_CARE_PROVIDER_SITE_OTHER): Payer: Medicare HMO | Admitting: Family Medicine

## 2022-11-14 VITALS — BP 110/69 | HR 77 | Temp 98.5°F | Ht 65.0 in | Wt 254.0 lb

## 2022-11-14 DIAGNOSIS — B351 Tinea unguium: Secondary | ICD-10-CM

## 2022-11-14 DIAGNOSIS — Z79899 Other long term (current) drug therapy: Secondary | ICD-10-CM

## 2022-11-14 DIAGNOSIS — F411 Generalized anxiety disorder: Secondary | ICD-10-CM | POA: Diagnosis not present

## 2022-11-14 DIAGNOSIS — F5104 Psychophysiologic insomnia: Secondary | ICD-10-CM

## 2022-11-14 MED ORDER — TIRZEPATIDE-WEIGHT MANAGEMENT 2.5 MG/0.5ML ~~LOC~~ SOLN: 2.5000 mg | SUBCUTANEOUS | Status: DC

## 2022-11-14 MED ORDER — ALPRAZOLAM 0.5 MG PO TABS: 0.2500 mg | ORAL_TABLET | Freq: Every day | ORAL | 5 refills | Status: DC | PRN

## 2022-11-14 MED ORDER — TERBINAFINE HCL 250 MG PO TABS: 250.0000 mg | ORAL_TABLET | Freq: Every day | ORAL | 0 refills | Status: DC

## 2022-11-14 MED ORDER — TIRZEPATIDE-WEIGHT MANAGEMENT 2.5 MG/0.5ML ~~LOC~~ SOLN: 2.5000 mg | SUBCUTANEOUS | 0 refills | Status: DC

## 2022-11-14 NOTE — Progress Notes (Signed)
Subjective: CC: Follow-up mood PCP: Raliegh Ip, DO [id number] L Sherri Howard is a 67 y.o. female presenting to clinic today for:  1.  Depression with anxiety She has come off of the GLP.  She is interested in resuming though because she is not losing the weight that she had hoped on her own and reflects that a lot of this impacts her generalized mood and mental health.  She is compliant with her medications and does need a refill on her alprazolam as she is finding herself having more anxiety.  She is still not interested in seeing a therapist at this time.  Though she does report that her son started seeing 1 recently is now being placed on meds for similar.  2.  Fungus She reports some discoloration of the right great toenail.  Denies any preceding injury.  She has some yellow changes to the left great toenail.  She read that Vaseline might be helpful for funguses but was not sure what would be safe for her to use.   ROS: Per HPI  Allergies  Allergen Reactions   Ciprofloxacin Rash   Clindamycin Rash   Influenza Vaccines Rash   Latex Rash   Other     PER PT JEHOVAH WITNESS, BLOOD PRODUCT REFUSAL   Temazepam Rash   Enablex [Darifenacin Hydrobromide Er] Swelling    Causes swelling of patient's tongue   Hydroxyzine Hcl     Per pt was given prior to scheduled tonsillectomy in 1975 and had convulsions under anesthesia   Prednisolone Other (See Comments)    Convulsions per pt   Amoxicillin Diarrhea   Oxycodone Rash    percocet   Pantoprazole Rash   Pneumococcal Vaccine Swelling and Rash    Lymph node swelling   Sulfa Antibiotics Rash   Tape Rash    Bandages, etc.   Past Medical History:  Diagnosis Date   Anemia    Anxiety    Chronic rhinitis    evaluated by dr y. Selena [facility] note in epic 11-24-2019   Depression    DOE (dyspnea on exertion) 04-05-2020  per pt occasional gets sob sitting, lying, getting up and down, and with exertion   04-05-2020 pt pcp  sent to cardiologist,  dr Jens Som for evalution, note in epic 03-28-2020 (ordered echo and cardiac CT and they have been scheduled   Endometrial polyp    First degree heart block    Headache    History of adenomatous polyp of colon    History of kidney stones    History of palpitations    previous had event monitor 2017 showed SR/ first degree heart block, no arrhythmia's and normal echo   History of seizure    ED visit 08-22-2014 in epic for possible seizure, per ed note adverse reaction to prednisone stimulant    Hyperlipidemia    Hypertension    followed by pcp   and cardiology (nuclear stress test 01-11- 2018 in epic, normal perfusion no ischemia with normal LV function and wall motion, nuclear ef 74%)   IBS (irritable bowel syndrome)    Nephrolithiasis    04-05-2020 per pt bilateral nonobstructive   OSA on CPAP    study in epic 08-31-1998 mild osa, recommended cpap   PMB (postmenopausal bleeding)    Refusal of blood transfusions as patient is Jehovah's Witness    Right ureteral calculus 11/07/2015   S/P herniorrhaphy 07/31/2014   SUI (stress urinary incontinence, female)     Current  Outpatient Medications:    acetaminophen (TYLENOL) 325 MG tablet, Take 650 mg by mouth every 6 (six) hours as needed for mild pain or moderate pain. , Disp: , Rfl:    ALPRAZolam (XANAX) 0.5 MG tablet, Take 0.5-1 tablets (0.25-0.5 mg total) by mouth daily as needed for anxiety (PUT ON FILE). May take extra tablet during the day prn panic., Disp: 45 tablet, Rfl: 5   busPIRone (BUSPAR) 10 MG tablet, Take 2 tablets (20 mg total) by mouth 2 (two) times daily. FOR ANXIETY, Disp: 360 tablet, Rfl: 0   carvedilol (COREG) 6.25 MG tablet, Take 1 tablet (6.25 mg total) by mouth 2 (two) times daily., Disp: 180 tablet, Rfl: 3   diphenhydrAMINE (BENADRYL) 25 mg capsule, Take by mouth., Disp: , Rfl:    famotidine (PEPCID) 20 MG tablet, Take one tablet (20mg ) by mouth twice a day, Disp: 180 tablet, Rfl: 3    hyoscyamine (LEVSIN SL) 0.125 MG SL tablet, DISSOLVE 1 TABLET UNDER THE TONGUE EVERY 6 HOURS AS NEEDED, Disp: 30 tablet, Rfl: PRN   Lactobacillus (PROBIOTIC ACIDOPHILUS PO), Take 1 tablet by mouth daily., Disp: , Rfl:    loratadine (CLARITIN) 10 MG tablet, Take 10 mg by mouth daily., Disp: , Rfl:    losartan (COZAAR) 100 MG tablet, TAKE 1 TABLET DAILY, Disp: 90 tablet, Rfl: 1   Omega-3 Fatty Acids (FISH OIL PO), Take 1 capsule by mouth daily., Disp: , Rfl:    potassium citrate (UROCIT-K) 10 MEQ (1080 MG) SR tablet, Take 1 tablet (10 mEq total) by mouth 3 (three) times daily., Disp: 270 tablet, Rfl: 0   rosuvastatin (CRESTOR) 20 MG tablet, Take 1 tablet (20 mg total) by mouth daily., Disp: 90 tablet, Rfl: 1   Vilazodone HCl (VIIBRYD) 40 MG TABS, Take 1 tablet (40 mg total) by mouth daily., Disp: 90 tablet, Rfl: 2 Social History   Socioeconomic History   Marital status: Married    Spouse name: Not on file   Number of children: 1   Years of education: Not on file   Highest education level: 12th grade  Occupational History   Not on file  Tobacco Use   Smoking status: Never   Smokeless tobacco: Never  Vaping Use   Vaping status: Never Used  Substance and Sexual Activity   Alcohol use: Not Currently    Comment: Rare   Drug use: Never   Sexual activity: Not on file  Other Topics Concern   Not on file  Social History Narrative   Not on file   Social Determinants of Health   Financial Resource Strain: Low Risk  (11/12/2022)   Overall Financial Resource Strain (CARDIA)    Difficulty of Paying Living Expenses: Not hard at all  Food Insecurity: No Food Insecurity (11/12/2022)   Hunger Vital Sign    Worried About Running Out of Food in the Last Year: Never true    Ran Out of Food in the Last Year: Never true  Transportation Needs: No Transportation Needs (11/12/2022)   PRAPARE - Administrator, Civil Service (Medical): No    Lack of Transportation (Non-Medical): No   Physical Activity: Unknown (11/12/2022)   Exercise Vital Sign    Days of Exercise per Week: Patient declined    Minutes of Exercise per Session: Not on file  Stress: Stress Concern Present (11/12/2022)   [employer] of Occupational Health - Occupational Stress Questionnaire    Feeling of Stress : Very much  Social Connections: Socially Integrated (11/12/2022)  Social Connection and Isolation Panel [NHANES]    Frequency of Communication with Friends and Family: Three times a week    Frequency of Social Gatherings with Friends and Family: More than three times a week    Attends Religious Services: More than 4 times per year    Active Member of [employer] or Organizations: Yes    Attends Engineer, structural: More than 4 times per year    Marital Status: Married  Catering manager Violence: Unknown (04/17/2021)   Received from [employer], [facility]   HITS    Physically Hurt: Not on file    Insult or Talk Down To: Not on file    Threaten Physical Harm: Not on file    Scream or Curse: Not on file   Family History  Problem Relation Age of Onset   Cancer Mother    Aneurysm Mother    Allergic rhinitis Mother    Breast cancer Mother 57   Atrial fibrillation Mother    Cancer Father    Hypertension Father    Alzheimer's disease Father    CAD Father    Allergic rhinitis Brother    COPD Maternal Aunt    Eczema Son    Bronchitis Son    Sinusitis Son    Asthma Neg Hx    Urticaria Neg Hx    Immunodeficiency Neg Hx    Atopy Neg Hx    Angioedema Neg Hx     Objective: Office vital signs reviewed. BP 110/69   Pulse 77   Temp 98.5 F (36.9 C)   Ht 5\' 5"  (1.651 m)   Wt 254 lb (115.2 kg)   SpO2 97%   BMI 42.27 kg/m   Physical Examination:  General: Awake, alert, well nourished, morbidly obese. No acute distress HEENT: sclera white, MMM Cardio: regular rate and rhythm, S1S2 heard, no murmurs appreciated Pulm: clear to auscultation bilaterally, no wheezes,  rhonchi or rales; normal work of breathing on room air Skin: Brownish hyperpigmentation noted along the right lateral great toenail.  She has yellow/whitish discoloration along the lateral left great toenail and the distal portion.     11/14/2022    1:25 PM 02/03/2022    2:57 PM 12/18/2021    3:01 PM  Depression screen PHQ 2/9  Decreased Interest 3 1 1   Down, Depressed, Hopeless 3 1 0  PHQ - 2 Score 6 2 1   Altered sleeping 2 1 3   Tired, decreased energy 3 2 2   Change in appetite 2 1 3   Feeling bad or failure about yourself  2 1 2   Trouble concentrating 2 1 2   Moving slowly or fidgety/restless 1 1 1   Suicidal thoughts 1 1 1   PHQ-9 Score 19 10 15   Difficult doing work/chores   Somewhat difficult      11/14/2022    1:25 PM 02/03/2022    2:56 PM 12/18/2021    3:02 PM 11/04/2021   11:16 AM  GAD 7 : Generalized Anxiety Score  Nervous, Anxious, on Edge 3 2 1 2   Control/stop worrying 2 2 1 1   Worry too much - different things 2 2 1 1   Trouble relaxing 2 3 1 1   Restless 2 2 1 1   Easily annoyed or irritable 1 2 1 1   Afraid - awful might happen 1 0 1 1  Total GAD 7 Score 13 13 7 8   Anxiety Difficulty Very difficult  Not difficult at all Somewhat difficult    Assessment/ Plan: 67 y.o.  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  I have renewed her alprazolam.  National narcotic database reviewed and there were no red flags.  Continue Viibryd as prescribed, BuSpar as prescribed.  I again offered her integrated behavioral health referral but she declines this today.  I have prescribed her compounded Zepbound  and sent to compound doing pharmacy.  Will plan to see her back again in a couple of months for LFT checks as we started Lamisil for onychomycosis.  Discussed home care instructions to ensure reduce the risk of reinfection.  She voiced good understanding and will follow-up as scheduled for blood work   Densel Kronick Hulen Skains, DO [facility] [phone]

## 2022-11-24 ENCOUNTER — Encounter: Payer: Self-pay | Admitting: Nurse Practitioner

## 2022-11-24 ENCOUNTER — Telehealth (INDEPENDENT_AMBULATORY_CARE_PROVIDER_SITE_OTHER): Payer: Medicare HMO | Admitting: Nurse Practitioner

## 2022-11-24 DIAGNOSIS — R058 Other specified cough: Secondary | ICD-10-CM | POA: Insufficient documentation

## 2022-11-24 DIAGNOSIS — J0110 Acute frontal sinusitis, unspecified: Secondary | ICD-10-CM | POA: Insufficient documentation

## 2022-11-24 MED ORDER — OXYMETAZOLINE HCL 0.05 % NA SOLN: 1.0000 | Freq: Two times a day (BID) | NASAL | 0 refills | Status: DC

## 2022-11-24 MED ORDER — AZITHROMYCIN 250 MG PO TABS: ORAL_TABLET | ORAL | 0 refills | Status: DC

## 2022-11-24 NOTE — Progress Notes (Signed)
Virtual Visit via video Note Due to COVID-19 pandemic this visit was conducted virtually. This visit type was conducted due to national recommendations for restrictions regarding the COVID-19 Pandemic (e.g. social distancing, sheltering in place) in an effort to limit this patient's exposure and mitigate transmission in our community. All issues noted in this document were discussed and addressed.  A physical exam was not performed with this format.   I connected with Sherri Howard on 11/24/2022 at 1057 by name and DOB and verified that I am speaking with the correct person using two identifiers. Sherri Howard is currently located at home during visit. The provider, Arrie Aran Santa Lighter, DNP is located home at time of visit.  I discussed the limitations, risks, security and privacy concerns of performing an evaluation and management service by virtual visit and the availability of in person appointments. I also discussed with the patient that there may be a patient responsible charge related to this service. The patient expressed understanding and agreed to proceed.  Subjective:  [name], female    DOB: [birth year], 67 y.o.   MRN: [id number]  Chief Complaint:  URI   HPI: Sherri Howard is a 67 y.o. female presenting on 11/24/2022 for URI Seen today video visit c/o of productive cough "green sputum" for over 1-weeks. Reports that she had 2 at home COVID test that ws negative over the weekend.   She denies a history of chest pain, chills, fevers, myalgias, and nausea and does not a history of asthma. Patient denies smoke cigarettes.   OBJECTIVE: She appears well, vital signs are as noted. Ears normal.  Throat and pharynx normal.  Neck supple. No adenopathy in the neck. Nose is congested. Sinuses non tender. The chest is clear, without wheezes or rales. Active Ambulatory Problems    Diagnosis Date Noted   Palpitations 06/26/2015   Adenomatous colon polyp 07/21/2012   Benign  essential hypertension 12/20/2014   Chronic recurrent major depressive disorder (HCC) 12/20/2014   Dyslipidemia 12/20/2014   Generalized anxiety disorder 12/20/2014   History of colectomy 09/07/2012   History of renal calculi 07/21/2012   Irritable bowel syndrome 12/20/2014   OSA on CPAP 12/20/2014   Lactose intolerance 12/20/2014   Insomnia 12/20/2014   Vitamin D deficiency 12/20/2014   DOE (dyspnea on exertion) 03/02/2015   Urinary incontinence 12/20/2014   Ventral incisional hernia 06/16/2014   Encounter for screening colonoscopy 07/17/2016   Morbid obesity due to excess calories (HCC) 08/13/2016   Pruritus 11/24/2019   Chronic rhinitis 11/24/2019   Multiple drug allergies 11/24/2019   Pharyngitis 07/18/2020   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   Parathyroid adenoma 09/26/2021   Other hyperparathyroidism (HCC) 09/27/2021   Osteopenia of left femoral neck 11/04/2021   Cough productive of purulent sputum 11/24/2022   Acute non-recurrent frontal sinusitis 11/24/2022   Resolved Ambulatory Problems    Diagnosis Date Noted   Morbid obesity with BMI of 40.0-44.9, adult (HCC) 12/20/2014   Hydronephrosis 06/18/2015   Fatigue 08/09/2015   Acute bilateral low back pain without sciatica 08/24/2015   Encounter for opiate analgesic use agreement 09/20/2015   Pain medication agreement signed 09/20/2015   Depression, recurrent (HCC) 02/28/2016   Flank pain 03/02/2015   Pyelonephritis 11/09/2015   Other specified postprocedural states 07/31/2014   Right ureteral calculus 11/07/2015   S/P herniorrhaphy 07/31/2014   Pruritic rash 11/24/2019   Vaginal discharge 03/13/2020   Vaginal bleeding 03/13/2020   Edema 10/24/2020   Diarrhea  10/24/2020   Asymptomatic bacteriuria 07/23/2021   Past Medical History:  Diagnosis Date   Anemia    Anxiety    Depression    Endometrial polyp    First degree heart block    Headache    History of adenomatous polyp of colon    History of  kidney stones    History of palpitations    History of seizure    Hyperlipidemia    Hypertension    IBS (irritable bowel syndrome)    Nephrolithiasis    PMB (postmenopausal bleeding)    Refusal of blood transfusions as patient is Jehovah's Witness    SUI (stress urinary incontinence, female)        Relevant past medical, surgical, family, and social history reviewed and updated as indicated.  Allergies and medications reviewed and updated.   Past Medical History:  Diagnosis Date   Anemia    Anxiety    Chronic rhinitis    evaluated by dr y. Selena [facility] note in epic 11-24-2019   Depression    DOE (dyspnea on exertion) 04-05-2020  per pt occasional gets sob sitting, lying, getting up and down, and with exertion   04-05-2020 pt pcp sent to cardiologist,  dr Jens Som for evalution, note in epic 03-28-2020 (ordered echo and cardiac CT and they have been scheduled   Endometrial polyp    First degree heart block    Headache    History of adenomatous polyp of colon    History of kidney stones    History of palpitations    previous had event monitor 2017 showed SR/ first degree heart block, no arrhythmia's and normal echo   History of seizure    ED visit 08-22-2014 in epic for possible seizure, per ed note adverse reaction to prednisone stimulant    Hyperlipidemia    Hypertension    followed by pcp   and cardiology (nuclear stress test 01-11- 2018 in epic, normal perfusion no ischemia with normal LV function and wall motion, nuclear ef 74%)   IBS (irritable bowel syndrome)    Nephrolithiasis    04-05-2020 per pt bilateral nonobstructive   OSA on CPAP    study in epic 08-31-1998 mild osa, recommended cpap   PMB (postmenopausal bleeding)    Refusal of blood transfusions as patient is Jehovah's Witness    Right ureteral calculus 11/07/2015   S/P herniorrhaphy 07/31/2014   SUI (stress urinary incontinence, female)     Past Surgical History:  Procedure Laterality  Date   BREAST SURGERY  1974   lumpectomy, per pt benign   CYSTOSCOPY/RETROGRADE/URETEROSCOPY  02-01-2008  @[facility]    CYSTOSCOPY/URETEROSCOPY/HOLMIUM LASER/STENT PLACEMENT  2008;  11-07-2015 @[facility]    HYSTEROSCOPY WITH D & C N/A 04/09/2020   Procedure: DILATATION AND CURETTAGE /HYSTEROSCOPY;  Surgeon: Romualdo Bolk, MD;  Location: [facility] [place];  Service: Gynecology;  Laterality: N/A;   LAPAROSCOPIC CHOLECYSTECTOMY  2014   LAPAROSCOPIC ILEOCECECTOMY  09/07/2012   @ [facility]   W/  APPENDECTOMY (done for large cecal polyp, non-malignant)   OVARIAN CYST SURGERY  1988   unsure which side   TONSILLECTOMY  05/1973   VENTRAL HERNIA REPAIR  07-31-2014   @[facility]    incisional  (open)    Social History   Socioeconomic History   Marital status: Married    Spouse name: Not on file   Number of children: 1   Years of education: Not on file   Highest education level: 12th grade  Occupational History  Not on file  Tobacco Use   Smoking status: Never   Smokeless tobacco: Never  Vaping Use   Vaping status: Never Used  Substance and Sexual Activity   Alcohol use: Not Currently    Comment: Rare   Drug use: Never   Sexual activity: Not on file  Other Topics Concern   Not on file  Social History Narrative   Not on file   Social Determinants of Health   Financial Resource Strain: Low Risk  (11/12/2022)   Overall Financial Resource Strain (CARDIA)    Difficulty of Paying Living Expenses: Not hard at all  Food Insecurity: No Food Insecurity (11/12/2022)   Hunger Vital Sign    Worried About Running Out of Food in the Last Year: Never true    Ran Out of Food in the Last Year: Never true  Transportation Needs: No Transportation Needs (11/12/2022)   PRAPARE - Administrator, Civil Service (Medical): No    Lack of Transportation (Non-Medical): No  Physical Activity: Unknown (11/12/2022)   Exercise Vital Sign    Days of Exercise per Week: Patient declined    Minutes of  Exercise per Session: Not on file  Stress: Stress Concern Present (11/12/2022)   [employer] of Occupational Health - Occupational Stress Questionnaire    Feeling of Stress : Very much  Social Connections: Socially Integrated (11/12/2022)   Social Connection and Isolation Panel [NHANES]    Frequency of Communication with Friends and Family: Three times a week    Frequency of Social Gatherings with Friends and Family: More than three times a week    Attends Religious Services: More than 4 times per year    Active Member of [employer] or Organizations: Yes    Attends Engineer, structural: More than 4 times per year    Marital Status: Married  Catering manager Violence: Unknown (04/17/2021)   Received from [employer], [facility]   HITS    Physically Hurt: Not on file    Insult or Talk Down To: Not on file    Threaten Physical Harm: Not on file    Scream or Curse: Not on file    Outpatient Encounter Medications as of 11/24/2022  Medication Sig   azithromycin (ZITHROMAX Z-PAK) 250 MG tablet Take 2 tabs for the first day and take 1-tab until done   oxymetazoline (AFRIN 12 HOUR) 0.05 % nasal spray Place 1 spray into both nostrils 2 (two) times daily.   acetaminophen (TYLENOL) 325 MG tablet Take 650 mg by mouth every 6 (six) hours as needed for mild pain or moderate pain.    [START ON 12/13/2022] ALPRAZolam (XANAX) 0.5 MG tablet Take 0.5-1 tablets (0.25-0.5 mg total) by mouth daily as needed for anxiety (PUT ON FILE). May take extra tablet during the day prn panic.   busPIRone (BUSPAR) 10 MG tablet Take 2 tablets (20 mg total) by mouth 2 (two) times daily. FOR ANXIETY   carvedilol (COREG) 6.25 MG tablet Take 1 tablet (6.25 mg total) by mouth 2 (two) times daily.   diphenhydrAMINE (BENADRYL) 25 mg capsule Take by mouth.   famotidine (PEPCID) 20 MG tablet Take one tablet (20mg ) by mouth twice a day   hyoscyamine (LEVSIN SL) 0.125 MG SL tablet DISSOLVE 1 TABLET UNDER THE TONGUE  EVERY 6 HOURS AS NEEDED   Lactobacillus (PROBIOTIC ACIDOPHILUS PO) Take 1 tablet by mouth daily.   loratadine (CLARITIN) 10 MG tablet Take 10 mg by mouth daily.   losartan (COZAAR)  100 MG tablet TAKE 1 TABLET DAILY   Omega-3 Fatty Acids (FISH OIL PO) Take 1 capsule by mouth daily.   potassium citrate (UROCIT-K) 10 MEQ (1080 MG) SR tablet Take 1 tablet (10 mEq total) by mouth 3 (three) times daily.   rosuvastatin (CRESTOR) 20 MG tablet Take 1 tablet (20 mg total) by mouth daily.   terbinafine (LAMISIL) 250 MG tablet Take 1 tablet (250 mg total) by mouth daily.   tirzepatide (ZEPBOUND) 2.5 MG/0.5ML injection vial Inject 2.5 mg into the skin once a week. Compounded at [facility] drug   Vilazodone HCl (VIIBRYD) 40 MG TABS Take 1 tablet (40 mg total) by mouth daily.   No facility-administered encounter medications on file as of 11/24/2022.    Allergies  Allergen Reactions   Ciprofloxacin Rash   Clindamycin Rash   Influenza Vaccines Rash   Latex Rash   Other     PER PT JEHOVAH WITNESS, BLOOD PRODUCT REFUSAL   Temazepam Rash   Enablex [Darifenacin Hydrobromide Er] Swelling    Causes swelling of patient's tongue   Hydroxyzine Hcl     Per pt was given prior to scheduled tonsillectomy in 1975 and had convulsions under anesthesia   Prednisolone Other (See Comments)    Convulsions per pt   Amoxicillin Diarrhea   Oxycodone Rash    percocet   Pantoprazole Rash   Pneumococcal Vaccine Swelling and Rash    Lymph node swelling   Sulfa Antibiotics Rash   Tape Rash    Bandages, etc.    Review of Systems  Constitutional:  Negative for chills, fatigue and fever.  HENT:  Positive for congestion, rhinorrhea and sore throat. Negative for trouble swallowing and voice change.   Eyes:  Negative for pain.  Respiratory:  Positive for cough. Negative for chest tightness, shortness of breath and wheezing.        Green sputum  Cardiovascular:  Negative for chest pain and palpitations.  Endocrine: Negative  for polydipsia and polyphagia.  Musculoskeletal:  Negative for myalgias.  Skin:  Negative for color change and rash.  Neurological:  Negative for dizziness and headaches.  Psychiatric/Behavioral:  Negative for suicidal ideas.    Observations/Objective: No vital signs or physical exam, this was a virtual health encounter.  Pt alert and oriented, answers all questions appropriately, and able to speak in full sentences.    Assessment and Plan: Coline was seen today for uri.  Diagnoses and all orders for this visit:  Acute non-recurrent frontal sinusitis -     oxymetazoline (AFRIN 12 HOUR) 0.05 % nasal spray; Place 1 spray into both nostrils 2 (two) times daily.  Cough productive of purulent sputum -     azithromycin (ZITHROMAX Z-PAK) 250 MG tablet; Take 2 tabs for the first day and take 1-tab until done   Gavin Pound is a 67 year old Caucasian female seen via telehealth, no acute distress Rhinorrhea: Afrin nasal decongestant twice daily Productive cough/URI: Zithromax No. 6 tablets dispensed-take 2 tablets on day 1 and continue 1 tablet daily until done -Over-the-counter Coricidin for cough -Increase hydration, Tylenol/ibuprofen for fever All question answered  Follow Up Instructions: Return if symptoms worsen or fail to improve.    I discussed the assessment and treatment plan with the patient. The patient was provided an opportunity to ask questions and all were answered. The patient agreed with the plan and demonstrated an understanding of the instructions.   The patient was advised to call back or seek an in-person evaluation if the symptoms worsen or  if the condition fails to improve as anticipated.  The above assessment and management plan was discussed with the patient. The patient verbalized understanding of and has agreed to the management plan. Patient is aware to call the clinic if they develop any new symptoms or if symptoms persist or worsen. Patient is aware when to return  to the clinic for a follow-up visit. Patient educated on when it is appropriate to go to the emergency department.    I provided 10 minutes of time during this video encounter.   Arrie Aran Santa Lighter, DNP [facility] [address] [place], [place] [address] [phone] 11/24/2022  It appears that you have a viral upper respiratory infection (cold).  Cold symptoms can last up to 2 weeks.  I recommend that you only use cold medications that are safe in high blood pressure like Coricidin (generic is fine).  Other cold medications can increase your blood pressure.    - Get plenty of rest and drink plenty of fluids. - Try to breathe moist air. Use a cold mist humidifier. - Consume warm fluids (soup or tea) to provide relief for a stuffy nose and to loosen phlegm. - For nasal stuffiness, try saline nasal spray or a Neti Pot.  Afrin nasal spray can also be used but this product should not be used longer than 3 days or it will cause rebound nasal stuffiness (worsening nasal congestion). - For sore throat pain relief: use chloraseptic spray, suck on throat lozenges, hard candy or popsicles; gargle with warm salt water (1/4 tsp. salt per 8 oz. of water); and eat soft, bland foods. - Eat a well-balanced diet. If you cannot, ensure you are getting enough nutrients by taking a daily multivitamin. - Avoid dairy products, as they can thicken phlegm. - Avoid alcohol, as it impairs your body's immune system.  CONTACT YOUR DOCTOR IF YOU EXPERIENCE ANY OF THE FOLLOWING: - High fever - Ear pain - Sinus-type headache - Unusually severe cold symptoms - Cough that gets worse while other cold symptoms improve - Flare up of any chronic lung problem, such as asthma - Your symptoms persist longer than 2 weeks

## 2022-11-24 NOTE — Addendum Note (Signed)
Addended by: Martina Sinner on: 11/24/2022 12:17 PM   Modules accepted: Level of Service

## 2022-11-24 NOTE — Patient Instructions (Signed)

## 2022-11-27 DIAGNOSIS — R809 Proteinuria, unspecified: Secondary | ICD-10-CM | POA: Diagnosis not present

## 2022-11-27 DIAGNOSIS — N139 Obstructive and reflux uropathy, unspecified: Secondary | ICD-10-CM | POA: Diagnosis not present

## 2022-11-27 DIAGNOSIS — N200 Calculus of kidney: Secondary | ICD-10-CM | POA: Diagnosis not present

## 2022-11-27 DIAGNOSIS — N1831 Chronic kidney disease, stage 3a: Secondary | ICD-10-CM | POA: Diagnosis not present

## 2022-12-18 ENCOUNTER — Ambulatory Visit (HOSPITAL_COMMUNITY): Payer: Medicare HMO | Attending: Internal Medicine

## 2022-12-18 DIAGNOSIS — I10 Essential (primary) hypertension: Secondary | ICD-10-CM | POA: Diagnosis not present

## 2022-12-18 LAB — ECHOCARDIOGRAM COMPLETE
Area-P 1/2: 3.77 cm2
S' Lateral: 2.2 cm

## 2022-12-19 ENCOUNTER — Telehealth: Payer: Self-pay

## 2022-12-19 NOTE — Telephone Encounter (Addendum)
Called patient regarding results. Patient had understanding of results. Patient will follow up with PCP regarding findings regarding Liver.----- Message from Joni Reining sent at 12/19/2022  8:04 AM EST ----- I have reviewed her echocardiogram Normal heart pumping function Incidental finding of cyst on her liver Should see PCP for further testing if warranted. She has a good echo report.

## 2022-12-23 ENCOUNTER — Encounter: Payer: Self-pay | Admitting: Family Medicine

## 2022-12-26 ENCOUNTER — Ambulatory Visit (INDEPENDENT_AMBULATORY_CARE_PROVIDER_SITE_OTHER): Payer: Medicare HMO | Admitting: Family Medicine

## 2022-12-26 ENCOUNTER — Encounter: Payer: Self-pay | Admitting: Family Medicine

## 2022-12-26 VITALS — BP 123/71 | HR 68 | Temp 98.5°F | Ht 65.0 in | Wt 259.0 lb

## 2022-12-26 DIAGNOSIS — B351 Tinea unguium: Secondary | ICD-10-CM

## 2022-12-26 DIAGNOSIS — F411 Generalized anxiety disorder: Secondary | ICD-10-CM

## 2022-12-26 DIAGNOSIS — G4733 Obstructive sleep apnea (adult) (pediatric): Secondary | ICD-10-CM

## 2022-12-26 NOTE — Progress Notes (Signed)
Subjective: CC: Follow-up mood PCP: Raliegh Ip, DO ZOX:WRUEA L Sherri Howard is a 67 y.o. female presenting to clinic today for:  1.  Weight recheck Patient could not afford the compounded Zepbound.  She is holding off for the 2025-year to see if perhaps this might become formulary for her.  She notes that she sadly continues to gain weight.  She admits to increasing stress related to her weight.  She notes that her child and their spouse and her grandchild recently moved into her home because their home has a mold problem.  She anticipates they will be staying for about a year.  She of course worries about some of the financial strains that may result from this but is trying to be as supportive as possible.  She enjoys having her grandbaby around.  She reports increasing heart palpitations.  She admits that she has obstructive sleep apnea but has not been able to tolerate many CPAP masks because of the strain it causes on her face.  This results in increased swelling often.  She is on a beta-blocker.  2.  Onychomycosis She reports that the fungus seems to be getting better.  The old nails are growing out and the new nail looks clear.  She reports no complications with the Lamisil   ROS: Per HPI  Allergies  Allergen Reactions   Ciprofloxacin Rash   Clindamycin Rash   Influenza Vaccines Rash   Latex Rash   Other     PER PT JEHOVAH WITNESS, BLOOD PRODUCT REFUSAL   Temazepam Rash   Enablex [Darifenacin Hydrobromide Er] Swelling    Causes swelling of patient's tongue   Hydroxyzine Hcl     Per pt was given prior to scheduled tonsillectomy in 1975 and had convulsions under anesthesia   Prednisolone Other (See Comments)    Convulsions per pt   Amoxicillin Diarrhea   Oxycodone Rash    percocet   Pantoprazole Rash   Pneumococcal Vaccine Swelling and Rash    Lymph node swelling   Sulfa Antibiotics Rash   Tape Rash    Bandages, etc.   Past Medical History:  Diagnosis Date    Anemia    Anxiety    Chronic rhinitis    evaluated by dr y. Selena [facility] note in epic 11-24-2019   Depression    DOE (dyspnea on exertion) 04-05-2020  per pt occasional gets sob sitting, lying, getting up and down, and with exertion   04-05-2020 pt pcp sent to cardiologist,  dr Jens Som for evalution, note in epic 03-28-2020 (ordered echo and cardiac CT and they have been scheduled   Endometrial polyp    First degree heart block    Headache    History of adenomatous polyp of colon    History of kidney stones    History of palpitations    previous had event monitor 2017 showed SR/ first degree heart block, no arrhythmia's and normal echo   History of seizure    ED visit 08-22-2014 in epic for possible seizure, per ed note adverse reaction to prednisone stimulant    Hyperlipidemia    Hypertension    followed by pcp   and cardiology (nuclear stress test 01-11- 2018 in epic, normal perfusion no ischemia with normal LV function and wall motion, nuclear ef 74%)   IBS (irritable bowel syndrome)    Nephrolithiasis    04-05-2020 per pt bilateral nonobstructive   OSA on CPAP    study in epic 08-31-1998 mild  osa, recommended cpap   PMB (postmenopausal bleeding)    Refusal of blood transfusions as patient is Jehovah's Witness    Right ureteral calculus 11/07/2015   S/P herniorrhaphy 07/31/2014   SUI (stress urinary incontinence, female)     Current Outpatient Medications:    acetaminophen (TYLENOL) 325 MG tablet, Take 650 mg by mouth every 6 (six) hours as needed for mild pain or moderate pain. , Disp: , Rfl:    ALPRAZolam (XANAX) 0.5 MG tablet, Take 0.5-1 tablets (0.25-0.5 mg total) by mouth daily as needed for anxiety (PUT ON FILE). May take extra tablet during the day prn panic., Disp: 45 tablet, Rfl: 5   busPIRone (BUSPAR) 10 MG tablet, Take 2 tablets (20 mg total) by mouth 2 (two) times daily. FOR ANXIETY, Disp: 360 tablet, Rfl: 0   carvedilol (COREG) 6.25 MG tablet, Take  1 tablet (6.25 mg total) by mouth 2 (two) times daily., Disp: 180 tablet, Rfl: 3   diphenhydrAMINE (BENADRYL) 25 mg capsule, Take by mouth., Disp: , Rfl:    famotidine (PEPCID) 20 MG tablet, Take one tablet (20mg ) by mouth twice a day, Disp: 180 tablet, Rfl: 3   hyoscyamine (LEVSIN SL) 0.125 MG SL tablet, DISSOLVE 1 TABLET UNDER THE TONGUE EVERY 6 HOURS AS NEEDED, Disp: 30 tablet, Rfl: PRN   Lactobacillus (PROBIOTIC ACIDOPHILUS PO), Take 1 tablet by mouth daily., Disp: , Rfl:    loratadine (CLARITIN) 10 MG tablet, Take 10 mg by mouth daily., Disp: , Rfl:    losartan (COZAAR) 100 MG tablet, TAKE 1 TABLET DAILY, Disp: 90 tablet, Rfl: 1   Omega-3 Fatty Acids (FISH OIL PO), Take 1 capsule by mouth daily., Disp: , Rfl:    potassium citrate (UROCIT-K) 10 MEQ (1080 MG) SR tablet, Take 1 tablet (10 mEq total) by mouth 3 (three) times daily., Disp: 270 tablet, Rfl: 0   rosuvastatin (CRESTOR) 20 MG tablet, Take 1 tablet (20 mg total) by mouth daily., Disp: 90 tablet, Rfl: 1   terbinafine (LAMISIL) 250 MG tablet, Take 1 tablet (250 mg total) by mouth daily., Disp: 90 tablet, Rfl: 0   Vilazodone HCl (VIIBRYD) 40 MG TABS, Take 1 tablet (40 mg total) by mouth daily., Disp: 90 tablet, Rfl: 2 Social History   Socioeconomic History   Marital status: Married    Spouse name: Not on file   Number of children: 1   Years of education: Not on file   Highest education level: 12th grade  Occupational History   Not on file  Tobacco Use   Smoking status: Never   Smokeless tobacco: Never  Vaping Use   Vaping status: Never Used  Substance and Sexual Activity   Alcohol use: Not Currently    Comment: Rare   Drug use: Never   Sexual activity: Not on file  Other Topics Concern   Not on file  Social History Narrative   Not on file   Social Drivers of Health   Financial Resource Strain: Low Risk  (12/25/2022)   Overall Financial Resource Strain (CARDIA)    Difficulty of Paying Living Expenses: Not very hard   Food Insecurity: No Food Insecurity (12/25/2022)   Hunger Vital Sign    Worried About Running Out of Food in the Last Year: Never true    Ran Out of Food in the Last Year: Never true  Transportation Needs: No Transportation Needs (12/25/2022)   PRAPARE - Administrator, Civil Service (Medical): No    Lack of Transportation (Non-Medical):  No  Physical Activity: Insufficiently Active (12/25/2022)   Exercise Vital Sign    Days of Exercise per Week: 1 day    Minutes of Exercise per Session: 10 min  Stress: Stress Concern Present (12/25/2022)   [employer] of Occupational Health - Occupational Stress Questionnaire    Feeling of Stress : To some extent  Social Connections: Socially Integrated (12/25/2022)   Social Connection and Isolation Panel [NHANES]    Frequency of Communication with Friends and Family: More than three times a week    Frequency of Social Gatherings with Friends and Family: Twice a week    Attends Religious Services: More than 4 times per year    Active Member of [employer] or Organizations: Yes    Attends Engineer, structural: More than 4 times per year    Marital Status: Married  Catering manager Violence: Unknown (04/17/2021)   Received from [employer], [facility]   HITS    Physically Hurt: Not on file    Insult or Talk Down To: Not on file    Threaten Physical Harm: Not on file    Scream or Curse: Not on file   Family History  Problem Relation Age of Onset   Cancer Mother    Aneurysm Mother    Allergic rhinitis Mother    Breast cancer Mother 23   Atrial fibrillation Mother    Cancer Father    Hypertension Father    Alzheimer's disease Father    CAD Father    Allergic rhinitis Brother    COPD Maternal Aunt    Eczema Son    Bronchitis Son    Sinusitis Son    Asthma Neg Hx    Urticaria Neg Hx    Immunodeficiency Neg Hx    Atopy Neg Hx    Angioedema Neg Hx     Objective: Office vital signs reviewed. BP 123/71   Pulse 68    Temp 98.5 F (36.9 C)   Ht 5\' 5"  (1.651 m)   Wt 259 lb (117.5 kg)   SpO2 99%   BMI 43.10 kg/m   Physical Examination:  General: Awake, alert, obese, No acute distress HEENT: sclera white, MMM Cardio: regular rate and rhythm, S1S2 heard, no murmurs appreciated.  Occasional extra beat noted Pulm: clear to auscultation bilaterally, no wheezes, rhonchi or rales; normal work of breathing on room air Extremities: Base of nail appears to be clear of fungal infection     12/26/2022    1:55 PM 11/14/2022    1:25 PM 02/03/2022    2:57 PM  Depression screen PHQ 2/9  Decreased Interest 2 3 1   Down, Depressed, Hopeless 2 3 1   PHQ - 2 Score 4 6 2   Altered sleeping 2 2 1   Tired, decreased energy 3 3 2   Change in appetite 2 2 1   Feeling bad or failure about yourself  3 2 1   Trouble concentrating 2 2 1   Moving slowly or fidgety/restless 1 1 1   Suicidal thoughts 1 1 1   PHQ-9 Score 18 19 10   Difficult doing work/chores Somewhat difficult        12/26/2022    1:55 PM 11/14/2022    1:25 PM 02/03/2022    2:56 PM 12/18/2021    3:02 PM  GAD 7 : Generalized Anxiety Score  Nervous, Anxious, on Edge 2 3 2 1   Control/stop worrying 1 2 2 1   Worry too much - different things 1 2 2 1   Trouble relaxing 2 2  3 1  Restless 1 2 2 1   Easily annoyed or irritable 1 1 2 1   Afraid - awful might happen 1 1 0 1  Total GAD 7 Score 9 13 13 7   Anxiety Difficulty  Very difficult  Not difficult at all     Assessment/ Plan: 67 y.o. female   Generalized anxiety disorder  Morbid obesity (HCC)  Onychomycosis - Plan: Hepatic Function Panel  OSA (obstructive sleep apnea)  Anxiety and depression remain uncontrolled.  This is very situational and sadly she is not able to afford the compounded GIP.  I am hopeful that we might be able to retry this next year.  She will continue current medications for anxiety and depression.  Glad to try and get her connected with a counselor should she decide to pursue this in  the future  Will check liver enzymes.  Responding to Lamisil.  Recommended that she try and utilize her CPAP machine as I do think that she is at high risk of developing heart disease due to untreated sleep apnea.  We discussed that this could present as A-fib, enlarged heart and even CHF.  We discussed consideration for oral appliance versus the inspire device.  Advised her to follow-up with her sleep medicine doctor should she decide to pursue either   Raliegh Ip, DO [facility] [phone]

## 2022-12-26 NOTE — Patient Instructions (Signed)
Anoscopy w/ stool study Oral appliance/ Inspire device

## 2022-12-27 LAB — HEPATIC FUNCTION PANEL
ALT: 17 [IU]/L (ref 0–32)
AST: 17 [IU]/L (ref 0–40)
Albumin: 4 g/dL (ref 3.9–4.9)
Alkaline Phosphatase: 67 [IU]/L (ref 44–121)
Bilirubin Total: 0.3 mg/dL (ref 0.0–1.2)
Bilirubin, Direct: 0.13 mg/dL (ref 0.00–0.40)
Total Protein: 6.4 g/dL (ref 6.0–8.5)

## 2023-01-06 ENCOUNTER — Other Ambulatory Visit: Payer: Self-pay | Admitting: Family Medicine

## 2023-01-09 NOTE — Telephone Encounter (Signed)
This appears to be another provider's rx that for some reason was refilled by our office back in sept.  Who is the original prescriber?  I wonder if she really needs to be on this TID?

## 2023-01-16 ENCOUNTER — Telehealth: Payer: Self-pay | Admitting: Family Medicine

## 2023-01-16 NOTE — Telephone Encounter (Signed)
 Under rx request

## 2023-01-16 NOTE — Telephone Encounter (Signed)
 Copied from CRM [id number]. Topic: General - Call Back - No Documentation >> Jan 16, 2023  9:09 AM Susanna ORN wrote: Reason for CRM: Patient called stating that she received a message to call back on her voicemail. There is no notes in chart regarding call. Please give patient a call back at [phone](home) or if she can't be reached on home number, pt stated to call her cell [phone].

## 2023-01-16 NOTE — Telephone Encounter (Signed)
 Spoke with patient dr. Wolfgang Howard does this for her and he has already filled it she does not need you too. And she does take it 3 times a day 2 in the morning and 1 at night.

## 2023-01-21 ENCOUNTER — Telehealth: Payer: Self-pay | Admitting: Family Medicine

## 2023-01-27 ENCOUNTER — Encounter: Payer: Self-pay | Admitting: Family Medicine

## 2023-01-27 ENCOUNTER — Other Ambulatory Visit: Payer: Self-pay | Admitting: Family Medicine

## 2023-01-27 DIAGNOSIS — G4733 Obstructive sleep apnea (adult) (pediatric): Secondary | ICD-10-CM

## 2023-01-27 DIAGNOSIS — N1831 Chronic kidney disease, stage 3a: Secondary | ICD-10-CM

## 2023-01-27 MED ORDER — ZEPBOUND 5 MG/0.5ML ~~LOC~~ SOAJ: 5.0000 mg | SUBCUTANEOUS | 0 refills | Status: DC

## 2023-01-27 MED ORDER — ZEPBOUND 2.5 MG/0.5ML ~~LOC~~ SOAJ: 2.5000 mg | SUBCUTANEOUS | 0 refills | Status: DC

## 2023-01-27 MED ORDER — ZEPBOUND 7.5 MG/0.5ML ~~LOC~~ SOAJ: 7.5000 mg | SUBCUTANEOUS | 0 refills | Status: DC

## 2023-02-02 ENCOUNTER — Other Ambulatory Visit: Payer: Self-pay | Admitting: Family Medicine

## 2023-02-02 DIAGNOSIS — F411 Generalized anxiety disorder: Secondary | ICD-10-CM

## 2023-02-06 ENCOUNTER — Telehealth: Payer: Self-pay

## 2023-02-06 ENCOUNTER — Telehealth: Payer: Self-pay | Admitting: Family Medicine

## 2023-02-06 DIAGNOSIS — Z6841 Body Mass Index (BMI) 40.0 and over, adult: Secondary | ICD-10-CM

## 2023-02-06 DIAGNOSIS — G4733 Obstructive sleep apnea (adult) (pediatric): Secondary | ICD-10-CM

## 2023-02-06 NOTE — Telephone Encounter (Signed)
Copied from CRM [id number]. Topic: Clinical - Prescription Issue >> Feb 06, 2023  1:57 PM Shelah Lewandowsky wrote: Reason for CRM: Patient wants an update on pre authorization for tirzepatide (ZEPBOUND) 7.5 MG/0.5ML Pen please call [phone]

## 2023-02-06 NOTE — Telephone Encounter (Signed)
PA request has been Started. New Encounter created for follow up. For additional info see Pharmacy Prior Auth telephone encounter from 02/06/23.

## 2023-02-06 NOTE — Telephone Encounter (Signed)
Pharmacy Patient Advocate Encounter   Received notification from Patient Advice Request messages that prior authorization for Zepbound 2.5MG /0.5ML pen-injectors is required/requested.   Insurance verification completed.   The patient is insured through [facility].   Per test claim: PA required; PA submitted to above mentioned insurance via CoverMyMeds Key/confirmation #/EOC [id number] Status is pending

## 2023-02-06 NOTE — Telephone Encounter (Signed)
Pharmacy Patient Advocate Encounter   Received notification from Pt Calls Messages that prior authorization for Zepbound 2.5MG /0.5ML pen-injectors is required/requested.   Insurance verification completed.   The patient is insured through [facility] .   Per test claim: PA required; PA started via CoverMyMeds. KEY [id number] . Waiting for clinical questions to populate.

## 2023-02-09 ENCOUNTER — Telehealth: Payer: Self-pay | Admitting: Family Medicine

## 2023-02-09 NOTE — Telephone Encounter (Signed)
Moderate OSA based on 09/2020 Polysomnogram

## 2023-02-09 NOTE — Telephone Encounter (Signed)
PA submitted in another encounter. For additional info see Pharmacy Prior Auth telephone encounter from 02/06/23.

## 2023-02-09 NOTE — Telephone Encounter (Signed)
Copied from CRM [id number]. Topic: Clinical - Prescription Issue >> Feb 06, 2023  3:38 PM Eunice Blase wrote: Reason for CRM: [facility] [place] ph: [phone], fax: [phone] need to know if patient has moderate to sever sleep apnea and if patient has been diagnoses with obesity regarding tirzepatide (ZEPBOUND) 2.5 MG/0.5ML Pen.

## 2023-02-11 ENCOUNTER — Other Ambulatory Visit: Payer: Self-pay

## 2023-02-11 ENCOUNTER — Telehealth: Payer: Self-pay

## 2023-02-11 NOTE — Telephone Encounter (Signed)
Addressed in another call.

## 2023-02-11 NOTE — Telephone Encounter (Signed)
Copied from CRM [id number]. Topic: Clinical - Medication Question >> Feb 11, 2023 11:06 AM Eunice Blase wrote: Reason for CRM: Received call from [employer] per Lyla Son regarding tirzepatide [facility]) 7.5 MG/0.5ML Pen needs to know if pt has a diagnosis of obesity. Please call [employer].

## 2023-02-11 NOTE — Telephone Encounter (Signed)
I suspect they are looking for her BMI code meaning the actual BMI.  I have put this in the diagnosis list so that can be reassociated and resubmitted.  I will also CC Raynelle Fanning for assistance  Morbid obesity (HCC)  OSA on CPAP  BMI 40.0-44.9, adult (HCC)

## 2023-02-11 NOTE — Telephone Encounter (Signed)
Pharmacy Patient Advocate Encounter  Received notification from [facility] Medicarethat Prior Authorization for Zepbound 2.5MG /0.5ML pen-injectors has been DENIED.  See denial reason below. No denial letter attached in CMM. Will attach denial letter to Media tab once received.   PA #/Case ID/Reference #: [id number]  *Originally denied because there was no documentation for OSA. We have done an appeal and uploaded the sleep study from 09/2020. Will let you know if approved. Appeals can take up to 30 days to be approved.

## 2023-02-13 NOTE — Telephone Encounter (Signed)
I have resubmitted that the patient has obesity and OSA. Typically takes about 7-10 business days for a appeal determination.

## 2023-02-18 ENCOUNTER — Telehealth: Payer: Self-pay

## 2023-02-18 NOTE — Telephone Encounter (Signed)
 Pt made aware

## 2023-02-18 NOTE — Telephone Encounter (Signed)
 Left a message requesting pt return call to the office.

## 2023-02-18 NOTE — Telephone Encounter (Signed)
Pt called regarding her DEXA scan. She has an appt with [place] 05/26/23 but her insurance will not cover a DEXA scan until after 9/18. Do you want pt to reschedule her appt after she can have a DEXA scan or keep her appt on 5/13 with lab work only?

## 2023-02-28 ENCOUNTER — Other Ambulatory Visit: Payer: Self-pay | Admitting: Family Medicine

## 2023-02-28 DIAGNOSIS — B351 Tinea unguium: Secondary | ICD-10-CM

## 2023-03-02 MED ORDER — TERBINAFINE HCL 250 MG PO TABS: 250.0000 mg | ORAL_TABLET | Freq: Every day | ORAL | 0 refills | Status: DC

## 2023-03-09 ENCOUNTER — Encounter: Payer: Self-pay | Admitting: Family Medicine

## 2023-03-13 ENCOUNTER — Encounter: Payer: Self-pay | Admitting: Family Medicine

## 2023-03-13 ENCOUNTER — Other Ambulatory Visit: Payer: Self-pay | Admitting: Family Medicine

## 2023-03-13 DIAGNOSIS — N1831 Chronic kidney disease, stage 3a: Secondary | ICD-10-CM

## 2023-03-13 DIAGNOSIS — G4733 Obstructive sleep apnea (adult) (pediatric): Secondary | ICD-10-CM

## 2023-03-13 NOTE — Telephone Encounter (Signed)
 She is out of the medicine?  The appointment next week is to determine if we dose increase her or not so without seeing her I am not really sure what dose were putting her on next

## 2023-03-17 ENCOUNTER — Encounter: Payer: Self-pay | Admitting: Family Medicine

## 2023-03-17 ENCOUNTER — Ambulatory Visit (INDEPENDENT_AMBULATORY_CARE_PROVIDER_SITE_OTHER): Payer: Medicare HMO | Admitting: Family Medicine

## 2023-03-17 VITALS — BP 123/74 | HR 71 | Temp 97.8°F | Ht 65.0 in | Wt 262.0 lb

## 2023-03-17 DIAGNOSIS — Z789 Other specified health status: Secondary | ICD-10-CM | POA: Diagnosis not present

## 2023-03-17 DIAGNOSIS — J3089 Other allergic rhinitis: Secondary | ICD-10-CM

## 2023-03-17 DIAGNOSIS — G4733 Obstructive sleep apnea (adult) (pediatric): Secondary | ICD-10-CM

## 2023-03-17 DIAGNOSIS — Z0001 Encounter for general adult medical examination with abnormal findings: Secondary | ICD-10-CM | POA: Diagnosis not present

## 2023-03-17 DIAGNOSIS — Z0000 Encounter for general adult medical examination without abnormal findings: Secondary | ICD-10-CM

## 2023-03-17 DIAGNOSIS — N1831 Chronic kidney disease, stage 3a: Secondary | ICD-10-CM | POA: Diagnosis not present

## 2023-03-17 DIAGNOSIS — F411 Generalized anxiety disorder: Secondary | ICD-10-CM

## 2023-03-17 DIAGNOSIS — F5104 Psychophysiologic insomnia: Secondary | ICD-10-CM | POA: Diagnosis not present

## 2023-03-17 DIAGNOSIS — B351 Tinea unguium: Secondary | ICD-10-CM | POA: Diagnosis not present

## 2023-03-17 DIAGNOSIS — Z23 Encounter for immunization: Secondary | ICD-10-CM

## 2023-03-17 DIAGNOSIS — Z6841 Body Mass Index (BMI) 40.0 and over, adult: Secondary | ICD-10-CM

## 2023-03-17 DIAGNOSIS — Z6379 Other stressful life events affecting family and household: Secondary | ICD-10-CM | POA: Diagnosis not present

## 2023-03-17 LAB — BAYER DCA HB A1C WAIVED: HB A1C (BAYER DCA - WAIVED): 5.4 % (ref 4.8–5.6)

## 2023-03-17 MED ORDER — DESLORATADINE 5 MG PO TABS: 5.0000 mg | ORAL_TABLET | Freq: Every day | ORAL | 3 refills | Status: DC

## 2023-03-17 MED ORDER — ZEPBOUND 10 MG/0.5ML ~~LOC~~ SOAJ: 10.0000 mg | SUBCUTANEOUS | 0 refills | Status: DC

## 2023-03-17 MED ORDER — ALPRAZOLAM 0.5 MG PO TABS
0.2500 mg | ORAL_TABLET | Freq: Every day | ORAL | 5 refills | Status: DC | PRN
Start: 2023-04-15 — End: 2023-07-20

## 2023-03-17 MED ORDER — ZEPBOUND 12.5 MG/0.5ML ~~LOC~~ SOAJ: 12.5000 mg | SUBCUTANEOUS | 0 refills | Status: DC

## 2023-03-17 MED ORDER — ZEPBOUND 15 MG/0.5ML ~~LOC~~ SOAJ: 15.0000 mg | SUBCUTANEOUS | 4 refills | Status: DC

## 2023-03-17 MED ORDER — CICLOPIROX 8 % EX SOLN: Freq: Every day | CUTANEOUS | 1 refills | Status: DC

## 2023-03-17 NOTE — Progress Notes (Signed)
 Subjective:    Sherri Howard is a 68 y.o. female who presents for a Welcome to Medicare exam.   She reports increased stress related to her friend who is failing to thrive being admitted to hospice.  She reports that so far she is doing okay on the Zepbound, which was started for morbid obesity/obstructive sleep apnea.  She reports no nausea, vomiting or abdominal pain.  She just finished 2.5 mg weekly for 1 month and will be starting 5 mg today.  She has noticed a decrease in the food noise and is optimistic about the weight loss she will experience with this medication   Cardiac Risk Factors include: advanced age (>58men, >4 women);obesity (BMI >30kg/m2)      Objective:    Today's Vitals   03/17/23 1403  BP: 123/74  Pulse: 71  Temp: 97.8 F (36.6 C)  TempSrc: Temporal  SpO2: 96%  Weight: 262 lb (118.8 kg)  Height: 5\' 5"  (1.651 m)  Body mass index is 43.6 kg/m.  Medications Outpatient Encounter Medications as of 03/17/2023  Medication Sig   acetaminophen (TYLENOL) 325 MG tablet Take 650 mg by mouth every 6 (six) hours as needed for mild pain or moderate pain.    ALPRAZolam (XANAX) 0.5 MG tablet Take 0.5-1 tablets (0.25-0.5 mg total) by mouth daily as needed for anxiety (PUT ON FILE). May take extra tablet during the day prn panic.   busPIRone (BUSPAR) 10 MG tablet TAKE 2 TABLETS BY MOUTH TWICE DAILY FOR ANXIETY   carvedilol (COREG) 6.25 MG tablet Take 1 tablet (6.25 mg total) by mouth 2 (two) times daily.   ciclopirox (PENLAC) 8 % solution Apply topically at bedtime. Apply over nail and surrounding skin. Apply daily over previous coat. After seven (7) days, may remove with alcohol and continue cycle.   desloratadine (CLARINEX) 5 MG tablet Take 1 tablet (5 mg total) by mouth daily. To replace claritin for allergies   diphenhydrAMINE (BENADRYL) 25 mg capsule Take by mouth.   famotidine (PEPCID) 20 MG tablet Take one tablet (20mg ) by mouth twice a day   hyoscyamine (LEVSIN SL)  0.125 MG SL tablet DISSOLVE 1 TABLET UNDER THE TONGUE EVERY 6 HOURS AS NEEDED   Lactobacillus (PROBIOTIC ACIDOPHILUS PO) Take 1 tablet by mouth daily.   losartan (COZAAR) 100 MG tablet TAKE 1 TABLET DAILY   Omega-3 Fatty Acids (FISH OIL PO) Take 1 capsule by mouth daily.   potassium citrate (UROCIT-K) 10 MEQ (1080 MG) SR tablet Take 1 tablet (10 mEq total) by mouth 3 (three) times daily.   rosuvastatin (CRESTOR) 20 MG tablet Take 1 tablet (20 mg total) by mouth daily.   terbinafine (LAMISIL) 250 MG tablet Take 1 tablet (250 mg total) by mouth daily.   tirzepatide (ZEPBOUND) 2.5 MG/0.5ML Pen Inject 2.5 mg into the skin once a week.   tirzepatide (ZEPBOUND) 5 MG/0.5ML Pen Inject 5 mg into the skin once a week.   tirzepatide (ZEPBOUND) 7.5 MG/0.5ML Pen Inject 7.5 mg into the skin once a week.   Vilazodone HCl (VIIBRYD) 40 MG TABS Take 1 tablet (40 mg total) by mouth daily.   [DISCONTINUED] loratadine (CLARITIN) 10 MG tablet Take 10 mg by mouth daily.   No facility-administered encounter medications on file as of 03/17/2023.     History: Past Medical History:  Diagnosis Date   Allergy    Anemia    Anxiety    Chronic rhinitis    evaluated by dr y. Selena Batten ([facility])  note in epic 11-24-2019   Depression    DOE (dyspnea on exertion) 04-05-2020  per pt occasional gets sob sitting, lying, getting up and down, and with exertion   04-05-2020 pt pcp sent to cardiologist,  dr Jens Som for evalution, note in epic 03-28-2020 (ordered echo and cardiac CT and they have been scheduled   Endometrial polyp    First degree heart block    GERD (gastroesophageal reflux disease)    Headache    Heart murmur    History of adenomatous polyp of colon    History of kidney stones    History of palpitations    previous had event monitor 2017 showed SR/ first degree heart block, no arrhythmia's and normal echo   History of seizure    ED visit 08-22-2014 in epic for possible seizure, per ed note adverse  reaction to prednisone stimulant    Hyperlipidemia    Hypertension    followed by pcp   and cardiology (nuclear stress test 01-11- 2018 in epic, normal perfusion no ischemia with normal LV function and wall motion, nuclear ef 74%)   IBS (irritable bowel syndrome)    Nephrolithiasis    04-05-2020 per pt bilateral nonobstructive   OSA on CPAP    study in epic 08-31-1998 mild osa, recommended cpap   PMB (postmenopausal bleeding)    Refusal of blood transfusions as patient is Jehovah's Witness    Right ureteral calculus 11/07/2015   S/P herniorrhaphy 07/31/2014   Seizures (HCC)    Sleep apnea    SUI (stress urinary incontinence, female)    Past Surgical History:  Procedure Laterality Date   APPENDECTOMY     BREAST SURGERY  1974   lumpectomy, per pt benign   COLON SURGERY     CYSTOSCOPY/RETROGRADE/URETEROSCOPY  02-01-2008  @[facility]    CYSTOSCOPY/URETEROSCOPY/HOLMIUM LASER/STENT PLACEMENT  2008;  11-07-2015 @[facility]    HERNIA REPAIR     HYSTEROSCOPY WITH D & C N/A 04/09/2020   Procedure: DILATATION AND CURETTAGE /HYSTEROSCOPY;  Surgeon: Romualdo Bolk, MD;  Location: [facility];  Service: Gynecology;  Laterality: N/A;   LAPAROSCOPIC CHOLECYSTECTOMY  2014   LAPAROSCOPIC ILEOCECECTOMY  09/07/2012   @ [facility]   W/  APPENDECTOMY (done for large cecal polyp, non-malignant)   OVARIAN CYST SURGERY  1988   unsure which side   TONSILLECTOMY  05/1973   VENTRAL HERNIA REPAIR  07-31-2014   @[facility]    incisional  (open)    Family History  Problem Relation Age of Onset   Cancer Mother    Aneurysm Mother    Allergic rhinitis Mother    Breast cancer Mother 39   Atrial fibrillation Mother    Cancer Father    Hypertension Father    Alzheimer's disease Father    CAD Father    Allergic rhinitis Brother    COPD Maternal Aunt    Eczema Son    Bronchitis Son    Sinusitis Son    Asthma Neg Hx    Urticaria Neg Hx    Immunodeficiency Neg Hx    Atopy Neg Hx    Angioedema Neg Hx     Social History   Occupational History   Not on file  Tobacco Use   Smoking status: Never   Smokeless tobacco: Never  Vaping Use   Vaping status: Never Used  Substance and Sexual Activity   Alcohol use: Not Currently    Comment: Rare   Drug use: Never   Sexual activity: Not Currently  Birth control/protection: None    Tobacco Counseling Counseling given: Not Answered   Immunizations and Health Maintenance Immunization History  Administered Date(s) Administered   Influenza,inj,Quad PF,6+ Mos 11/29/2019   Influenza-Unspecified 10/18/2020, 09/30/2021, 09/30/2021, 10/01/2022   PFIZER(Purple Top)SARS-COV-2 Vaccination 04/11/2019, 04/29/2019, 11/25/2019, 06/08/2020, 09/30/2021   Pneumococcal Conjugate-13 09/25/2010   Td 04/08/1999   Tdap 04/08/1999, 05/24/2012   Health Maintenance Due  Topic Date Due   Zoster Vaccines- Shingrix (1 of 2) Never done   COVID-19 Vaccine (6 - 2024-25 season) 09/14/2022    Activities of Daily Living    03/17/2023    2:10 PM 03/17/2023    9:40 AM  In your present state of health, do you have any difficulty performing the following activities:  Hearing? 0 0  Vision? 0 0  Difficulty concentrating or making decisions? 1 0  Walking or climbing stairs? 1 1  Dressing or bathing? 0 0  Doing errands, shopping? 0 0  Preparing Food and eating ? N N  Using the Toilet? N N  In the past six months, have you accidently leaked urine? Y Y  Do you have problems with loss of bowel control? Y Y  Managing your Medications? Y N  Comment pt husband handles   Managing your Finances? Y N  Comment pt's husband Clinical biochemist or managing your Housekeeping? Y N  Comment pt's husband handles     Physical Exam   Physical Exam  Gen: well appearing female, NAD, obese HEENT: sclera white, MMM Cardio: RRR, no murmurs Pulm: CTAB, normal work of breathing on room air MSK: ambulates independently     03/17/2023    2:01 PM 12/26/2022    1:55 PM 11/14/2022     1:25 PM  Depression screen PHQ 2/9  Decreased Interest 2 2 3   Down, Depressed, Hopeless 2 2 3   PHQ - 2 Score 4 4 6   Altered sleeping 2 2 2   Tired, decreased energy 3 3 3   Change in appetite 1 2 2   Feeling bad or failure about yourself  2 3 2   Trouble concentrating 2 2 2   Moving slowly or fidgety/restless 1 1 1   Suicidal thoughts 1 1 1   PHQ-9 Score 16 18 19   Difficult doing work/chores Somewhat difficult Somewhat difficult       03/17/2023    2:01 PM 12/26/2022    1:55 PM 11/14/2022    1:25 PM 02/03/2022    2:56 PM  GAD 7 : Generalized Anxiety Score  Nervous, Anxious, on Edge 3 2 3 2   Control/stop worrying 2 1 2 2   Worry too much - different things 1 1 2 2   Trouble relaxing 1 2 2 3   Restless 1 1 2 2   Easily annoyed or irritable 1 1 1 2   Afraid - awful might happen 1 1 1  0  Total GAD 7 Score 10 9 13 13   Anxiety Difficulty Somewhat difficult  Very difficult     Advanced Directives: Does Patient Have a Medical Advance Directive?: Yes Type of Advance Directive: Healthcare Power of Attorney Does patient want to make changes to medical advance directive?: Yes (ED - Information included in AVS) Copy of Healthcare Power of Attorney in Chart?: Yes - validated most recent copy scanned in chart (See row information)  EKG:  normal EKG, normal sinus rhythm, unchanged from previous tracings      Assessment:    This is a routine wellness examination for this patient .   Vision/Hearing screen No results found.  Goals      Weight (lb) < 200 lb (90.7 kg)        Depression Screen    03/17/2023    2:01 PM 12/26/2022    1:55 PM 11/14/2022    1:25 PM 02/03/2022    2:57 PM  PHQ 2/9 Scores  PHQ - 2 Score 4 4 6 2   PHQ- 9 Score 16 18 19 10      Fall Risk    03/17/2023    2:16 PM  Fall Risk   Falls in the past year? 0  Number falls in past yr: 0  Injury with Fall? 0  Risk for fall due to : No Fall Risks  Follow up Falls evaluation completed    Cognitive Function:     06/13/2022   10:25 AM 04/03/2016    1:12 PM  MMSE - Mini Mental State Exam  Orientation to time 5 5  Orientation to Place 5 5  Registration 3 3  Attention/ Calculation 5 5  Recall 3 3  Language- name 2 objects 2 2  Language- repeat 1 1  Language- follow 3 step command 3 3  Language- read & follow direction 1 1  Write a sentence 1 1  Copy design 1 1  Total score 30 30        03/17/2023    2:17 PM  6CIT Screen  What Year? 0 points  What month? 0 points  What time? 0 points  Count back from 20 0 points  Months in reverse 0 points  Repeat phrase 0 points  Total Score 0 points    Patient Care Team: Raliegh Ip, DO as PCP - General (Family Medicine) Lewayne Bunting, MD as PCP - Cardiology (Cardiology) Randa Lynn, MD as Referring Physician (Nephrology) Roma Kayser, MD as Consulting Physician (Endocrinology)     Plan:    Welcome to Medicare preventive visit - Plan: EKG 12-Lead  Full code status  Stress due to illness of family member  Morbid obesity (HCC) - Plan: CMP14+EGFR, Lipid panel, Bayer DCA Hb A1c Waived, EKG 12-Lead, CMP14+EGFR, Lipid panel, Bayer DCA Hb A1c Waived, tirzepatide (ZEPBOUND) 10 MG/0.5ML Pen, tirzepatide (ZEPBOUND) 12.5 MG/0.5ML Pen, tirzepatide (ZEPBOUND) 15 MG/0.5ML Pen  BMI 40.0-44.9, adult (HCC) - Plan: CMP14+EGFR, Lipid panel, Bayer DCA Hb A1c Waived, EKG 12-Lead, CMP14+EGFR, Lipid panel, Bayer DCA Hb A1c Waived, tirzepatide (ZEPBOUND) 10 MG/0.5ML Pen, tirzepatide (ZEPBOUND) 12.5 MG/0.5ML Pen, tirzepatide (ZEPBOUND) 15 MG/0.5ML Pen  OSA on CPAP - Plan: EKG 12-Lead, tirzepatide (ZEPBOUND) 10 MG/0.5ML Pen, tirzepatide (ZEPBOUND) 12.5 MG/0.5ML Pen, tirzepatide (ZEPBOUND) 15 MG/0.5ML Pen  Chronic kidney disease, stage 3a (HCC) - Plan: CMP14+EGFR, EKG 12-Lead, CBC, VITAMIN D 25 Hydroxy (Vit-D Deficiency, Fractures), VITAMIN D 25 Hydroxy (Vit-D Deficiency, Fractures), CMP14+EGFR, CBC, tirzepatide (ZEPBOUND) 10 MG/0.5ML Pen,  tirzepatide (ZEPBOUND) 12.5 MG/0.5ML Pen, tirzepatide (ZEPBOUND) 15 MG/0.5ML Pen  Non-seasonal allergic rhinitis, unspecified trigger - Plan: desloratadine (CLARINEX) 5 MG tablet  Onychomycosis of toenail - Plan: ciclopirox (PENLAC) 8 % solution  Generalized anxiety disorder - Plan: ALPRAZolam (XANAX) 0.5 MG tablet  Psychophysiological insomnia - Plan: ALPRAZolam (XANAX) 0.5 MG tablet  I did offer her referral to counseling services given stress related to illness of her friend but she declined this today.  I have gone ahead and extended her alprazolam but cautioned her to use this sparingly given the association with dementia and strong family history of dementia.  We also talked about how she will advance her dose each month and future  doses of Zepbound have been placed.  Would like to see her back in the next 3 to 4 months for repeat shingles vaccination as well as follow-up on weight  I have personally reviewed and noted the following in the patient's chart:   Medical and social history Use of alcohol, tobacco or illicit drugs  Current medications and supplements Functional ability and status Nutritional status Physical activity Advanced directives List of other physicians Hospitalizations, surgeries, and ER visits in previous 12 months Vitals Screenings to include cognitive, depression, and falls Referrals and appointments  In addition, I have reviewed and discussed with patient certain preventive protocols, quality metrics, and best practice recommendations. A written personalized care plan for preventive services as well as general preventive health recommendations were provided to patient.     Delynn Flavin, DO 03/17/2023

## 2023-03-17 NOTE — Patient Instructions (Addendum)
 You still have 2 refills on the alprazolam. USE SPARINGLY.  This medication INCREASES risk of development of dementia. Penlac soln sent for nails. Might not be covered by ins but should be less than $15 per bottle.  Thank you for coming in today for your Annual Medicare Wellness Visit.  Things that we discussed today are included in this packet.  Create and/or bring a copy of your Living Will/ Advanced Directive into the office so that we may respect your wishes should an emergency occur.  Get the recommended life-saving vaccines we discussed today.  Get your mammogram/ colonoscopy/ DEXA scans as directed by your provider.  Make sure that your medications are organized and safely stored.  Remember to always ask for help if you forget when/ how to take your medications.  Make healthy food choices (Rich in fruits/ veggies/ lean meats and low in salt, sugar and fat)  Do something that you enjoy for at least 30 minutes every day to stay active (walking, gardening, swimming, etc). This will help you lower your risk of falls/ broken bones.  Be social, do puzzles/ crosswords.  These things help the mind stay young and lower your risk of developing dementia.  Make sure that your home is safe by checking your smoke detectors regularly and doing the things outlined below to lower your risk of falls.

## 2023-03-18 ENCOUNTER — Other Ambulatory Visit: Payer: Self-pay | Admitting: Family Medicine

## 2023-03-18 ENCOUNTER — Encounter: Payer: Self-pay | Admitting: Family Medicine

## 2023-03-18 LAB — CBC
Hematocrit: 41 % (ref 34.0–46.6)
Hemoglobin: 13.4 g/dL (ref 11.1–15.9)
MCH: 28.9 pg (ref 26.6–33.0)
MCHC: 32.7 g/dL (ref 31.5–35.7)
MCV: 89 fL (ref 79–97)
Platelets: 295 10*3/uL (ref 150–450)
RBC: 4.63 x10E6/uL (ref 3.77–5.28)
RDW: 12.6 % (ref 11.7–15.4)
WBC: 5.4 10*3/uL (ref 3.4–10.8)

## 2023-03-18 LAB — CMP14+EGFR
ALT: 15 IU/L (ref 0–32)
AST: 16 IU/L (ref 0–40)
Albumin: 4.2 g/dL (ref 3.9–4.9)
Alkaline Phosphatase: 83 IU/L (ref 44–121)
BUN/Creatinine Ratio: 17 (ref 12–28)
BUN: 20 mg/dL (ref 8–27)
Bilirubin Total: 0.3 mg/dL (ref 0.0–1.2)
CO2: 23 mmol/L (ref 20–29)
Calcium: 9.5 mg/dL (ref 8.7–10.3)
Chloride: 105 mmol/L (ref 96–106)
Creatinine, Ser: 1.21 mg/dL — ABNORMAL HIGH (ref 0.57–1.00)
Globulin, Total: 2.6 g/dL (ref 1.5–4.5)
Glucose: 86 mg/dL (ref 70–99)
Potassium: 5.3 mmol/L — ABNORMAL HIGH (ref 3.5–5.2)
Sodium: 141 mmol/L (ref 134–144)
Total Protein: 6.8 g/dL (ref 6.0–8.5)
eGFR: 50 mL/min/{1.73_m2} — ABNORMAL LOW (ref >59)

## 2023-03-18 LAB — LIPID PANEL
Chol/HDL Ratio: 2.2 ratio (ref 0.0–4.4)
Cholesterol, Total: 128 mg/dL (ref 100–199)
HDL: 58 mg/dL (ref >39)
LDL Chol Calc (NIH): 50 mg/dL (ref 0–99)
Triglycerides: 112 mg/dL (ref 0–149)
VLDL Cholesterol Cal: 20 mg/dL (ref 5–40)

## 2023-03-18 LAB — VITAMIN D 25 HYDROXY (VIT D DEFICIENCY, FRACTURES): Vit D, 25-Hydroxy: 45.6 ng/mL (ref 30.0–100.0)

## 2023-03-18 MED ORDER — POTASSIUM CITRATE ER 10 MEQ (1080 MG) PO TBCR: 10.0000 meq | EXTENDED_RELEASE_TABLET | Freq: Two times a day (BID) | ORAL | Status: AC

## 2023-03-30 ENCOUNTER — Other Ambulatory Visit: Payer: Self-pay

## 2023-03-30 ENCOUNTER — Encounter: Payer: Self-pay | Admitting: Family Medicine

## 2023-03-30 ENCOUNTER — Other Ambulatory Visit: Payer: Self-pay | Admitting: Adult Health

## 2023-03-30 DIAGNOSIS — I10 Essential (primary) hypertension: Secondary | ICD-10-CM

## 2023-03-30 MED ORDER — LOSARTAN POTASSIUM 100 MG PO TABS: 100.0000 mg | ORAL_TABLET | Freq: Every day | ORAL | 1 refills | Status: DC

## 2023-04-13 ENCOUNTER — Other Ambulatory Visit: Payer: Self-pay | Admitting: Family Medicine

## 2023-04-13 DIAGNOSIS — F411 Generalized anxiety disorder: Secondary | ICD-10-CM

## 2023-04-16 ENCOUNTER — Telehealth: Payer: Self-pay | Admitting: "Endocrinology

## 2023-04-16 ENCOUNTER — Other Ambulatory Visit: Payer: Self-pay | Admitting: *Deleted

## 2023-04-16 DIAGNOSIS — M8580 Other specified disorders of bone density and structure, unspecified site: Secondary | ICD-10-CM

## 2023-04-16 DIAGNOSIS — E212 Other hyperparathyroidism: Secondary | ICD-10-CM

## 2023-04-16 DIAGNOSIS — D351 Benign neoplasm of parathyroid gland: Secondary | ICD-10-CM

## 2023-04-16 NOTE — Telephone Encounter (Signed)
Labs have been updated . 

## 2023-04-16 NOTE — Telephone Encounter (Signed)
 Pt needs labs updated

## 2023-04-21 ENCOUNTER — Telehealth: Payer: Self-pay

## 2023-04-21 NOTE — Telephone Encounter (Signed)
 Copied from CRM [id number]. Topic: Clinical - Medication Question >> Apr 21, 2023  2:12 PM Sherri Howard wrote: Reason for CRM: PT called in to see about their shingles vaccine and what date they took the first dose. Pt is requesting a follow up on when she should take her second shot. PT called back [phone]

## 2023-04-22 ENCOUNTER — Ambulatory Visit: Payer: Medicare HMO | Admitting: Family Medicine

## 2023-04-22 NOTE — Telephone Encounter (Signed)
Noted  -LS

## 2023-04-22 NOTE — Telephone Encounter (Signed)
 Spoke with pt and she wants to get her 2nd shingles shot when she sees PCP for f/u on 07/20/23.

## 2023-04-28 ENCOUNTER — Other Ambulatory Visit: Payer: Self-pay | Admitting: Family Medicine

## 2023-04-28 DIAGNOSIS — B351 Tinea unguium: Secondary | ICD-10-CM

## 2023-05-07 ENCOUNTER — Ambulatory Visit: Payer: 59 | Admitting: "Endocrinology

## 2023-05-14 ENCOUNTER — Ambulatory Visit: Admitting: Family Medicine

## 2023-05-14 ENCOUNTER — Encounter: Payer: Self-pay | Admitting: Family Medicine

## 2023-05-14 ENCOUNTER — Ambulatory Visit: Payer: Self-pay

## 2023-05-14 VITALS — BP 108/75 | HR 87 | Temp 98.4°F | Ht 65.0 in | Wt 253.0 lb

## 2023-05-14 DIAGNOSIS — N189 Chronic kidney disease, unspecified: Secondary | ICD-10-CM | POA: Diagnosis not present

## 2023-05-14 DIAGNOSIS — L309 Dermatitis, unspecified: Secondary | ICD-10-CM

## 2023-05-14 MED ORDER — FLUOCINONIDE 0.05 % EX CREA
1.0000 | TOPICAL_CREAM | Freq: Two times a day (BID) | CUTANEOUS | 5 refills | Status: DC
Start: 2023-05-14 — End: 2023-06-23

## 2023-05-14 MED ORDER — BETAMETHASONE SOD PHOS & ACET 6 (3-3) MG/ML IJ SUSP: 6.0000 mg | Freq: Once | INTRAMUSCULAR | Status: DC

## 2023-05-14 NOTE — Telephone Encounter (Signed)
 Chief Complaint: rash Symptoms: rash, itching, sores Frequency: 3-4 weeks Pertinent Negatives: Patient denies fever Disposition: [] ED /[] Urgent Care (no appt availability in office) / [x] Appointment(In office/virtual)/ []  [facility]/ [] Home Care/ [] Refused Recommended Disposition /[] [facility]/ []  Follow-up with PCP Additional Notes: pt states that she has a couple rashes on her chest and hives on the lower part of her arms. States that she is tired. States that she has been on Zepbound  for a month and last injection was last Friday. States bumps have been there around 3-4 weeks and itching.   Copied from CRM [id number]. Topic: Clinical - Red Word Triage >> May 14, 2023 10:52 AM Brynn Caras wrote: Red Word that prompted transfer to Nurse Triage: Possible side effects of Zepbound  - rash, headache, dizziness, extreme fatigue. Reason for Disposition  Taking new prescription medicine  (Exceptions: finished taking new prescription antibiotic OR questions about flushing from niacin)  Answer Assessment - Initial Assessment Questions 1. APPEARANCE of RASH: "Describe the rash." (e.g., spots, blisters, raised areas, skin peeling, scaly)     Hives on lower arms and and 3-4 sores red bumpy sores on chest 2. SIZE: "How big are the spots?" (e.g., tip of pen, eraser, coin; inches, centimeters)     Pencil eraser 3. LOCATION: "Where is the rash located?"     Bilateral chest area 4. COLOR: "What color is the rash?" (Note: It is difficult to assess rash color in people with darker-colored skin. When this situation occurs, simply ask the caller to describe what they see.)     red 5. ONSET: "When did the rash begin?"     3-4 weeks 6. FEVER: "Do you have a fever?" If Yes, ask: "What is your temperature, how was it measured, and when did it start?"     no 7. ITCHING: "Does the rash itch?" If Yes, ask: "How bad is the itch?" (Scale 1-10; or mild, moderate, severe)     mod 8. CAUSE: "What do  you think is causing the rash?"     zepbound  9. NEW MEDICINES: "What new medicines are you taking?" (e.g., name of antibiotic) "When did you start taking this medication?".     zepbound  10. OTHER SYMPTOMS: "Do you have any other symptoms?" (e.g., sore throat, fever, joint pain)       Fatigue and headache  Protocols used: Rash - Widespread On Drugs-A-AH

## 2023-05-14 NOTE — Progress Notes (Unsigned)
 Subjective:  [name], female    DOB: September 27, 1955  Age: 68 y.o. MRN: [id number]  CC: Allergic Reaction (Believes she is having reaction to Zepbound . Taking 3 months. Symptoms worsening and dosage is increasing. Bumps/rash on chest, fatigued, light headed, loose stool, acid reflux, hives on forearms 2 different times and itching with that. Hives did appear after sun exposure. )   HPI Sherri Howard presents for rash on the central chest as well as on the forearms that she describes as bumps.  Some hives were short-lived on the forearms but have resolved.  Onset was a few days ago.  Other symptoms are as noted above     05/14/2023    2:43 PM 03/17/2023    2:01 PM 12/26/2022    1:55 PM  Depression screen PHQ 2/9  Decreased Interest 2 2 2   Down, Depressed, Hopeless 2 2 2   PHQ - 2 Score 4 4 4   Altered sleeping  2 2  Tired, decreased energy 3 3 3   Change in appetite 2 1 2   Feeling bad or failure about yourself  2 2 3   Trouble concentrating 1 2 2   Moving slowly or fidgety/restless 0 1 1  Suicidal thoughts 0 1 1  PHQ-9 Score  16 18  Difficult doing work/chores Very difficult Somewhat difficult Somewhat difficult    History Sherri Howard has a past medical history of Allergy , Anemia, Anxiety, Chronic rhinitis, Depression, DOE (dyspnea on exertion) (04-05-2020  per pt occasional gets sob sitting, lying, getting up and down, and with exertion), Endometrial polyp, First degree heart block, GERD (gastroesophageal reflux disease), Headache, Heart murmur, History of adenomatous polyp of colon, History of kidney stones, History of palpitations, History of seizure, Hyperlipidemia, Hypertension, IBS (irritable bowel syndrome), Nephrolithiasis, OSA on CPAP, PMB (postmenopausal bleeding), Refusal of blood transfusions as patient is Jehovah's Witness, Right ureteral calculus (11/07/2015), S/P herniorrhaphy (07/31/2014), Seizures (HCC), Sleep apnea, and SUI (stress urinary incontinence, female).   She  has a past surgical history that includes Ovarian cyst surgery (1988); Laparoscopic ileocecectomy (09/07/2012   @ [facility]); Ventral hernia repair (07-31-2014   @[facility] ); Cystoscopy/retrograde/ureteroscopy (02-01-2008  @[facility] ); Cystoscopy/ureteroscopy/holmium laser/stent placement (2008;  11-07-2015 @[facility] ); Laparoscopic cholecystectomy (2014); Tonsillectomy (05/1973); Breast surgery (1974); Hysteroscopy with D & C (N/A, 04/09/2020); Hernia repair; Appendectomy; and Colon surgery.   Her family history includes Allergic rhinitis in her brother and mother; Alzheimer's disease in her father; Aneurysm in her mother; Atrial fibrillation in her mother; Breast cancer (age of onset: 67) in her mother; Bronchitis in her son; CAD in her father; COPD in her maternal aunt; Cancer in her father and mother; Eczema in her son; Hypertension in her father; Sinusitis in her son.She reports that she has never smoked. She has never used smokeless tobacco. She reports that she does not currently use alcohol. She reports that she does not use drugs.    ROS Review of Systems  Constitutional:  Positive for fatigue.  HENT: Negative.    Eyes:  Negative for visual disturbance.  Respiratory:  Negative for shortness of breath.   Cardiovascular:  Negative for chest pain.  Musculoskeletal:  Negative for arthralgias.    Objective:  BP 108/75   Pulse 87   Temp 98.4 F (36.9 C)   Ht 5\' 5"  (1.651 m)   Wt 253 lb (114.8 kg)   SpO2 95%   BMI 42.10 kg/m   BP Readings from Last 3 Encounters:  05/14/23 108/75  03/17/23 123/74  12/26/22 123/71  Wt Readings from Last 3 Encounters:  05/14/23 253 lb (114.8 kg)  03/17/23 262 lb (118.8 kg)  12/26/22 259 lb (117.5 kg)     Physical Exam Constitutional:      General: She is not in acute distress.    Appearance: She is well-developed.  Cardiovascular:     Rate and Rhythm: Normal rate and regular rhythm.  Pulmonary:     Breath sounds: Normal breath sounds.  Musculoskeletal:         General: Normal range of motion.  Skin:    General: Skin is warm and dry.     Findings: Rash (Mild maculopapular erythema in small papules and patches scant on the central chest few papules on the forearms.) present.  Neurological:     Mental Status: She is alert and oriented to person, place, and time.      Assessment & Plan:  Eczema, unspecified type -     Fluocinonide ; Apply 1 Application topically 2 (two) times daily.  Dispense: 30 g; Refill: 5 -     Betamethasone  Sod Phos & Acet   This looks more eczematous than like an allergic reaction.  Some of the more systemic related symptoms such as the GI distress and fatigue may well be related to the use of Zepbound  but I do not believe the acute rash to be related.  Follow-up: Return if symptoms worsen or fail to improve.  Sherri Howard, M.D.

## 2023-05-17 ENCOUNTER — Encounter: Payer: Self-pay | Admitting: Family Medicine

## 2023-05-26 ENCOUNTER — Ambulatory Visit: Payer: 59 | Admitting: "Endocrinology

## 2023-05-26 ENCOUNTER — Encounter: Payer: Self-pay | Admitting: Family Medicine

## 2023-05-28 DIAGNOSIS — N1831 Chronic kidney disease, stage 3a: Secondary | ICD-10-CM | POA: Diagnosis not present

## 2023-05-28 DIAGNOSIS — R809 Proteinuria, unspecified: Secondary | ICD-10-CM | POA: Diagnosis not present

## 2023-05-28 DIAGNOSIS — N139 Obstructive and reflux uropathy, unspecified: Secondary | ICD-10-CM | POA: Diagnosis not present

## 2023-05-28 DIAGNOSIS — N200 Calculus of kidney: Secondary | ICD-10-CM | POA: Diagnosis not present

## 2023-06-09 ENCOUNTER — Other Ambulatory Visit: Payer: Self-pay | Admitting: Family Medicine

## 2023-06-09 DIAGNOSIS — F411 Generalized anxiety disorder: Secondary | ICD-10-CM

## 2023-06-10 ENCOUNTER — Telehealth: Payer: Self-pay | Admitting: "Endocrinology

## 2023-06-10 NOTE — Telephone Encounter (Signed)
 Patient has an appt on 6/10 for DEXA results. The note from the schedulers states she is not due until September. Please advise

## 2023-06-11 NOTE — Telephone Encounter (Signed)
 Noted

## 2023-06-18 ENCOUNTER — Other Ambulatory Visit

## 2023-06-18 ENCOUNTER — Telehealth: Payer: Self-pay

## 2023-06-18 DIAGNOSIS — E212 Other hyperparathyroidism: Secondary | ICD-10-CM | POA: Diagnosis not present

## 2023-06-18 DIAGNOSIS — D351 Benign neoplasm of parathyroid gland: Secondary | ICD-10-CM | POA: Diagnosis not present

## 2023-06-18 DIAGNOSIS — M8580 Other specified disorders of bone density and structure, unspecified site: Secondary | ICD-10-CM | POA: Diagnosis not present

## 2023-06-18 NOTE — Telephone Encounter (Signed)
Left message for patient to callback to schedule lab appt. 

## 2023-06-18 NOTE — Telephone Encounter (Signed)
 They are ordered patient just needs lab appointment.

## 2023-06-18 NOTE — Telephone Encounter (Signed)
 Copied from CRM [id number]. Topic: Clinical - Lab/Test Results >> Jun 18, 2023 11:25 AM Tiffany B wrote: Reason for CRM: Patient states Dr. Monte Antonio her endocrinologist would like her to have labs done. Patient states its convenient for her to come into the practice to have labs draw. Please follow up with patient to advise if outside labs can be done at the practice. Patient would like a call back as soon as possible because fasting is required.

## 2023-06-20 LAB — COMPREHENSIVE METABOLIC PANEL WITH GFR
ALT: 26 IU/L (ref 0–32)
AST: 23 IU/L (ref 0–40)
Albumin: 4.2 g/dL (ref 3.9–4.9)
Alkaline Phosphatase: 65 IU/L (ref 44–121)
BUN/Creatinine Ratio: 12 (ref 12–28)
BUN: 13 mg/dL (ref 8–27)
Bilirubin Total: 0.4 mg/dL (ref 0.0–1.2)
CO2: 23 mmol/L (ref 20–29)
Calcium: 9.6 mg/dL (ref 8.7–10.3)
Chloride: 106 mmol/L (ref 96–106)
Creatinine, Ser: 1.07 mg/dL — ABNORMAL HIGH (ref 0.57–1.00)
Globulin, Total: 2.5 g/dL (ref 1.5–4.5)
Glucose: 93 mg/dL (ref 70–99)
Potassium: 5 mmol/L (ref 3.5–5.2)
Sodium: 141 mmol/L (ref 134–144)
Total Protein: 6.7 g/dL (ref 6.0–8.5)
eGFR: 58 mL/min/{1.73_m2} — ABNORMAL LOW (ref >59)

## 2023-06-20 LAB — PTH, INTACT AND CALCIUM: PTH: 57 pg/mL (ref 15–65)

## 2023-06-23 ENCOUNTER — Ambulatory Visit: Admitting: "Endocrinology

## 2023-06-23 ENCOUNTER — Encounter: Payer: Self-pay | Admitting: "Endocrinology

## 2023-06-23 VITALS — BP 110/74 | HR 80 | Ht 65.0 in | Wt 249.2 lb

## 2023-06-23 DIAGNOSIS — M8580 Other specified disorders of bone density and structure, unspecified site: Secondary | ICD-10-CM

## 2023-06-23 DIAGNOSIS — E212 Other hyperparathyroidism: Secondary | ICD-10-CM

## 2023-06-23 DIAGNOSIS — D351 Benign neoplasm of parathyroid gland: Secondary | ICD-10-CM | POA: Diagnosis not present

## 2023-06-23 NOTE — Progress Notes (Signed)
 06/23/2023, 3:47 PM  Endocrinology follow-up note  Sherri Howard is a 68 y.o.-year-old female, referred by her  Eliodoro Guerin, DO  . She is here to review recent work-up results after she was seen in consultation for hypercalcemia/hyperparathyroidism.   Past Medical History:  Diagnosis Date   Allergy     Anemia    Anxiety    Chronic rhinitis    evaluated by dr y. Burdette Carolin ([facility]) note in epic 11-24-2019   Depression    DOE (dyspnea on exertion) 04-05-2020  per pt occasional gets sob sitting, lying, getting up and down, and with exertion   04-05-2020 pt pcp sent to cardiologist,  dr Audery Blazing for evalution, note in epic 03-28-2020 (ordered echo and cardiac CT and they have been scheduled   Endometrial polyp    First degree heart block    GERD (gastroesophageal reflux disease)    Headache    Heart murmur    History of adenomatous polyp of colon    History of kidney stones    History of palpitations    previous had event monitor 2017 showed SR/ first degree heart block, no arrhythmia's and normal echo   History of seizure    ED visit 08-22-2014 in epic for possible seizure, per ed note adverse reaction to prednisone  stimulant    Hyperlipidemia    Hypertension    followed by pcp   and cardiology (nuclear stress test 01-11- 2018 in epic, normal perfusion no ischemia with normal LV function and wall motion, nuclear ef 74%)   IBS (irritable bowel syndrome)    Nephrolithiasis    04-05-2020 per pt bilateral nonobstructive   OSA on CPAP    study in epic 08-31-1998 mild osa, recommended cpap   PMB (postmenopausal bleeding)    Refusal of blood transfusions as patient is Jehovah's Witness    Right ureteral calculus 11/07/2015   S/P herniorrhaphy 07/31/2014   Seizures (HCC)    Sleep apnea    SUI (stress urinary incontinence, female)     Past Surgical History:  Procedure Laterality Date   APPENDECTOMY      BREAST SURGERY  1974   lumpectomy, per pt benign   COLON SURGERY     CYSTOSCOPY/RETROGRADE/URETEROSCOPY  02-01-2008  @[facility]    CYSTOSCOPY/URETEROSCOPY/HOLMIUM LASER/STENT PLACEMENT  2008;  11-07-2015 @[facility]    HERNIA REPAIR     HYSTEROSCOPY WITH D & C N/A 04/09/2020   Procedure: DILATATION AND CURETTAGE /HYSTEROSCOPY;  Surgeon: Wanita Gutta, MD;  Location: [facility] [place];  Service: Gynecology;  Laterality: N/A;   LAPAROSCOPIC CHOLECYSTECTOMY  2014   LAPAROSCOPIC ILEOCECECTOMY  09/07/2012   @ [facility]   W/  APPENDECTOMY (done for large cecal polyp, non-malignant)   OVARIAN CYST SURGERY  1988   unsure which side   TONSILLECTOMY  05/1973   VENTRAL HERNIA REPAIR  07-31-2014   @[facility]    incisional  (open)    Social History   Tobacco Use   Smoking status: Never   Smokeless tobacco: Never  Vaping Use   Vaping status: Never Used  Substance Use Topics   Alcohol use: Not Currently  Comment: Rare   Drug use: Never    Family History  Problem Relation Age of Onset   Cancer Mother    Aneurysm Mother    Allergic rhinitis Mother    Breast cancer Mother 71   Atrial fibrillation Mother    Cancer Father    Hypertension Father    Alzheimer's disease Father    CAD Father    Allergic rhinitis Brother    COPD Maternal Aunt    Eczema Son    Bronchitis Son    Sinusitis Son    Asthma Neg Hx    Urticaria Neg Hx    Immunodeficiency Neg Hx    Atopy Neg Hx    Angioedema Neg Hx     Outpatient Encounter Medications as of 06/23/2023  Medication Sig   acetaminophen  (TYLENOL ) 325 MG tablet Take 650 mg by mouth every 6 (six) hours as needed for mild pain or moderate pain.    ALPRAZolam  (XANAX ) 0.5 MG tablet Take 0.5-1 tablets (0.25-0.5 mg total) by mouth daily as needed for anxiety (PUT ON FILE). May take extra tablet during the day prn panic.   busPIRone  (BUSPAR ) 10 MG tablet TAKE 2 TABLETS BY MOUTH TWICE DAILY FOR ANXIETY   carvedilol  (COREG ) 6.25 MG tablet Take 1 tablet (6.25  mg total) by mouth 2 (two) times daily.   desloratadine  (CLARINEX ) 5 MG tablet Take 1 tablet (5 mg total) by mouth daily. To replace claritin for allergies   diphenhydrAMINE (BENADRYL) 25 mg capsule Take by mouth.   famotidine  (PEPCID ) 20 MG tablet Take one tablet (20mg ) by mouth twice a day   hyoscyamine  (LEVSIN SL) 0.125 MG SL tablet DISSOLVE 1 TABLET UNDER THE TONGUE EVERY 6 HOURS AS NEEDED   Lactobacillus (PROBIOTIC ACIDOPHILUS PO) Take 1 tablet by mouth daily.   losartan  (COZAAR ) 100 MG tablet Take 1 tablet (100 mg total) by mouth daily.   Omega-3 Fatty Acids (FISH OIL PO) Take 1 capsule by mouth daily.   potassium citrate  (UROCIT-K ) 10 MEQ (1080 MG) SR tablet Take 1 tablet (10 mEq total) by mouth in the morning and at bedtime. Renewal requests to Dr Carrolyn Clan in [place]   rosuvastatin  (CRESTOR ) 20 MG tablet TAKE ONE (1) TABLET BY MOUTH EVERY DAY   tirzepatide  (ZEPBOUND ) 15 MG/0.5ML Pen Inject 15 mg into the skin once a week.   Vilazodone  HCl (VIIBRYD ) 40 MG TABS TAKE ONE (1) TABLET BY MOUTH EVERY DAY   [DISCONTINUED] ciclopirox  (PENLAC ) 8 % solution Apply topically at bedtime. Apply over nail and surrounding skin. Apply daily over previous coat. After seven (7) days, may remove with alcohol and continue cycle.   [DISCONTINUED] fluocinonide  cream (LIDEX ) 0.05 % Apply 1 Application topically 2 (two) times daily.   [DISCONTINUED] tirzepatide  (ZEPBOUND ) 10 MG/0.5ML Pen Inject 10 mg into the skin once a week.   [DISCONTINUED] tirzepatide  (ZEPBOUND ) 12.5 MG/0.5ML Pen Inject 12.5 mg into the skin once a week.   [DISCONTINUED] tirzepatide  (ZEPBOUND ) 5 MG/0.5ML Pen Inject 5 mg into the skin once a week.   [DISCONTINUED] tirzepatide  (ZEPBOUND ) 7.5 MG/0.5ML Pen Inject 7.5 mg into the skin once a week.   Facility-Administered Encounter Medications as of 06/23/2023  Medication   betamethasone  acetate-betamethasone  sodium phosphate  (CELESTONE ) injection 6 mg    Allergies  Allergen Reactions    Ciprofloxacin  Rash   Clindamycin Rash   Influenza Vaccines Rash   Latex Rash   Other     PER PT JEHOVAH WITNESS, BLOOD PRODUCT REFUSAL   Temazepam Rash  Enablex  [Darifenacin  Hydrobromide Er] Swelling    Causes swelling of patient's tongue   Hydroxyzine Hcl     Per pt was given prior to scheduled tonsillectomy in 1975 and had convulsions under anesthesia   Prednisolone Other (See Comments)    Convulsions per pt   Amoxicillin Diarrhea   Oxycodone  Rash    percocet   Pantoprazole  Rash   Pneumococcal Vaccine Swelling and Rash    Lymph node swelling   Sulfa Antibiotics Rash   Tape Rash    Bandages, etc.     HPI  Malini L Lampron was diagnosed with hypercalcemia in .  Patient has no previously history of nephrolithiasis.  She denies history of pituitary, thyroid , adrenal dysfunctions.  She has mild CKD which appears to be improving.  Her current GFR is 58, serum creatinine 1.07.  She presents with better calcium  at 9.6 lower PTH of 57 improving from 90.    She does not have any new complaints today. She has a parathyroid  scan which showed possible adenoma on the right superior gland.   During her prior visit, she was sent for 24-hour urine calcium  measurement which did not reveal hypercalciuria, it was 70 mg / 24 hours. She also underwent DEXA scan in September 2023 which showed osteopenia of hip, normal spine and upper extremities. She denies any history of fragility fractures. She was kept on expectant management without active intervention at this time. -no family history of pituitary, adrenal, thyroid   dysfunctions.  she is not on HCTZ or other thiazide therapy.  No history of  vitamin D  deficiency.   she is not on calcium  supplements.  -Her other medical problems include high blood pressure on medications including amlodipine , carvedilol , losartan , hyperlipidemia on Crestor , omega-3 fatty acids. Her records indicate A1c of 5.1% recently, previously 5.8%. Patient is on Zepbound  15  mg every 7 days prescribed by her other providers.  She did not achieve any weight loss.   ROS:  Constitutional: + Minimally fluctuating body weight,  no fatigue, no subjective hyperthermia, no subjective hypothermia Eyes: no blurry vision, no xerophthalmia ENT: no sore throat, no nodules palpated in throat, no dysphagia/odynophagia, no hoarseness   PE: BP 110/74   Pulse 80   Ht 5\' 5"  (1.651 m)   Wt 249 lb 3.2 oz (113 kg)   BMI 41.47 kg/m , Body mass index is 41.47 kg/m. Wt Readings from Last 3 Encounters:  06/23/23 249 lb 3.2 oz (113 kg)  05/14/23 253 lb (114.8 kg)  03/17/23 262 lb (118.8 kg)    Constitutional: + BMI 41.3, not in acute distress, normal state of mind Eyes: PERRLA, EOMI, no exophthalmos ENT: moist mucous membranes, no gross thyromegaly, no gross cervical lymphadenopathy    CMP ( most recent) CMP     Component Value Date/Time   NA 141 06/18/2023 1302   K 5.0 06/18/2023 1302   CL 106 06/18/2023 1302   CO2 23 06/18/2023 1302   GLUCOSE 93 06/18/2023 1302   GLUCOSE 111 (H) 04/09/2020 0920   BUN 13 06/18/2023 1302   CREATININE 1.07 (H) 06/18/2023 1302   CREATININE 1.07 (H) 03/08/2019 1540   CALCIUM  9.6 06/18/2023 1302   PROT 6.7 06/18/2023 1302   ALBUMIN 4.2 06/18/2023 1302   AST 23 06/18/2023 1302   ALT 26 06/18/2023 1302   ALKPHOS 65 06/18/2023 1302   BILITOT 0.4 06/18/2023 1302   GFRNONAA 58 (L) 11/07/2019 1229   GFRNONAA 56 (L) 03/08/2019 1540   GFRAA 67 11/07/2019 1229  GFRAA 65 03/08/2019 1540     Diabetic Labs (most recent): Lab Results  Component Value Date   HGBA1C 5.4 03/17/2023   HGBA1C 5.2 06/13/2022   HGBA1C 5.3 11/07/2019     Lipid Panel ( most recent) Lipid Panel     Component Value Date/Time   CHOL 128 03/17/2023 1401   TRIG 112 03/17/2023 1401   HDL 58 03/17/2023 1401   CHOLHDL 2.2 03/17/2023 1401   CHOLHDL 2.1 07/02/2021 1310   LDLCALC 50 03/17/2023 1401   LDLCALC 39 07/02/2021 1310   LABVLDL 20 03/17/2023 1401       Lab Results  Component Value Date   TSH 3.080 06/13/2022   TSH 1.190 11/07/2019   TSH 3.350 03/23/2019   TSH 3.060 06/04/2015   FREET4 0.89 06/04/2015     July 02, 2021 labs: Calcium  9.9, BUN 12, creatinine 1.14, albumin 4.4, PTH 90.  Jun 01, 2021 vitamin B12 430.  Recent Results (from the past 2160 hours)  PTH, intact and calcium      Status: None   Collection Time: 06/18/23  1:02 PM  Result Value Ref Range   PTH 57 15 - 65 pg/mL   PTH Interp Comment     Comment: Interpretation                 Intact PTH    Calcium                                  (pg/mL)      (mg/dL) Normal                          15 - 65     8.6 - 10.2 Primary Hyperparathyroidism         >65          >10.2 Secondary Hyperparathyroidism       >65          <10.2 Non-Parathyroid  Hypercalcemia       <65          >10.2 Hypoparathyroidism                  <15          < 8.6 Non-Parathyroid  Hypocalcemia    15 - 65          < 8.6   Comprehensive metabolic panel with GFR     Status: Abnormal   Collection Time: 06/18/23  1:02 PM  Result Value Ref Range   Glucose 93 70 - 99 mg/dL   BUN 13 8 - 27 mg/dL   Creatinine, Ser 1.61 (H) 0.57 - 1.00 mg/dL   eGFR 58 (L) >09 UE/AVW/0.98   BUN/Creatinine Ratio 12 12 - 28   Sodium 141 134 - 144 mmol/L   Potassium 5.0 3.5 - 5.2 mmol/L   Chloride 106 96 - 106 mmol/L   CO2 23 20 - 29 mmol/L   Calcium  9.6 8.7 - 10.3 mg/dL   Total Protein 6.7 6.0 - 8.5 g/dL   Albumin 4.2 3.9 - 4.9 g/dL   Globulin, Total 2.5 1.5 - 4.5 g/dL   Bilirubin Total 0.4 0.0 - 1.2 mg/dL   Alkaline Phosphatase 65 44 - 121 IU/L   AST 23 0 - 40 IU/L   ALT 26 0 - 32 IU/L   Bone density on September 30, 2021: [facility] Spine L1-L4 09/30/2021 64.0 Normal 0.3 1.214  g/cm2 -   DualFemur Neck Left 09/30/2021 64.0 Osteopenia -2.1 0.743 g/cm2 - -   DualFemur Total Mean 09/30/2021 64.0 Normal -0.9 0.898 g/cm2 - -   Left Forearm Radius 33% 09/30/2021 64.0 Normal 0.6 0.757 g/cm2 - - ASSESSMENT: The BMD measured  at Femur Neck Left is 0.743 g/cm2 with a T-score of -2.1. This patient is considered osteopenic according to World Health Organization [facility]) criteria. The scan quality is good.  Assessment: 1. Hyperparathyroidism 2.  Parathyroid  adenoma 3.  Osteopenia of hip  Plan: I reviewed her new and existing labs/bone density results with her and her husband.   The major finding has been documentation of high PTH 90 on the background of mild CKD without significant hypercalcemia nor hypercalciuria.   In her repeat labs, calcium  was 9.6 associated with PTH of 57.    She has osteopenia of hip and history of nephrolithiasis. -A diagnosis of primary hyperparathyroidism is still possible due to her positive nuclear medicine scan showing possible right superior parathyroid  adenoma. -However, she does not have enough criteria for surgical intervention at this time. Reports history of calcium  based  nephrolithiasis.  She wishes to delay surgical intervention. She will be on expectant management for until next measurement of PTH/calcium , 24-hour urine calcium  as well as repeat bone density.    - I discussed with the patient about the physiology of calcium  and parathyroid  hormone, and possible  effects of  increased PTH/ Calcium  , including kidney stones, cardiac dysrhythmias, osteoporosis, abdominal pain, etc.    If her results are next visit confirm primary hyperparathyroidism,  she will be offered surgical treatment.    - If she declines this option, she will be considered for intervention with [facility].    The couple is exploring options of weight control, currently on Zepbound  50 mg subcutaneously weekly without any major weight change.     She was offered discussion for lifestyle medicine, however she opted to delay it for now.   She is advised to maintain close follow-up follow-up with her PCP as well as nephrologist.  I spent  25  minutes in the care of the patient today including review of labs  from Thyroid  Function, CMP, and other relevant labs ; imaging/biopsy records (current and previous including abstractions from other facilities); face-to-face time discussing  her lab results and symptoms, medications doses, her options of short and long term treatment based on the latest standards of care / guidelines;   and documenting the encounter.  Arther Bill  participated in the discussions, expressed understanding, and voiced agreement with the above plans.  All questions were answered to her satisfaction. she is encouraged to contact clinic should she have any questions or concerns prior to her return visit.   - Return in about 6 months (around 12/23/2023) for 24 Hr Urine Ca & Cr, F/U with Pre-visit Labs, DXA Scan B4 NV.   Kalvin Orf, MD [facility] [address] [place], [place] [address] Phone: [phone]  Fax: [phone]    This note was partially dictated with voice recognition software. Similar sounding words can be transcribed inadequately or may not  be corrected upon review.  06/23/2023, 3:47 PM

## 2023-07-07 ENCOUNTER — Other Ambulatory Visit: Payer: Self-pay | Admitting: Family Medicine

## 2023-07-07 DIAGNOSIS — F411 Generalized anxiety disorder: Secondary | ICD-10-CM

## 2023-07-08 ENCOUNTER — Encounter: Payer: Self-pay | Admitting: Family Medicine

## 2023-07-08 ENCOUNTER — Ambulatory Visit: Admitting: Family Medicine

## 2023-07-08 VITALS — BP 104/71 | HR 87 | Temp 97.3°F | Wt 246.0 lb

## 2023-07-08 DIAGNOSIS — L568 Other specified acute skin changes due to ultraviolet radiation: Secondary | ICD-10-CM

## 2023-07-08 DIAGNOSIS — L819 Disorder of pigmentation, unspecified: Secondary | ICD-10-CM

## 2023-07-08 MED ORDER — TRIAMCINOLONE ACETONIDE 0.1 % EX CREA: 1.0000 | TOPICAL_CREAM | Freq: Two times a day (BID) | CUTANEOUS | 0 refills | Status: DC | PRN

## 2023-07-08 NOTE — Progress Notes (Signed)
   Acute Office Visit  Subjective:     [name], female    DOB: 06-13-1955, 68 y.o.   MRN: [id number]  Chief Complaint  Patient presents with   Rash    Rash This is a recurrent problem. The current episode started in the past 7 days. The problem has been waxing and waning since onset. The affected locations include the left arm. The rash is characterized by burning, redness and itchiness (bumpy). Associated with: seems to occur after sun exposure. Past treatments include antihistamine and anti-itch cream. The treatment provided no relief. Her past medical history is significant for eczema.   She also has a mole on her chest that has become raised and irregular shaped over the last few months.   ROS As per HPI.      Objective:    BP 104/71   Pulse 87   Temp (!) 97.3 F (36.3 C) (Temporal)   Wt 246 lb (111.6 kg)   SpO2 95%   BMI 40.94 kg/m    Physical Exam Vitals and nursing note reviewed.  Constitutional:      General: She is not in acute distress.    Appearance: She is not ill-appearing, toxic-appearing or diaphoretic.  Pulmonary:     Effort: Pulmonary effort is normal. No respiratory distress.   Skin:    General: Skin is warm and dry.     Comments: Small mildly erythematous papules to left distal upper arm that extends to proximal left forearm. No exudate, warmth, or tenderness.   Minimally raised pigmented lesion with irregular borders to right upper chest.    Neurological:     General: No focal deficit present.     Mental Status: She is alert and oriented to person, place, and time.   Psychiatric:        Mood and Affect: Mood normal.        Behavior: Behavior normal.     No results found for any visits on 07/08/23.      Assessment & Plan:   Sherlyn was seen today for rash.  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 Use sunscreen, avoid sun exposure if possible. Use products  for sensitive skin. Denies new medications.  -     triamcinolone  cream (KENALOG ) 0.1 %; Apply 1 Application topically 2 (two) times daily as needed.  Atypical pigmented lesion Discussed referral to dermatology. She will do some research regarding providers and notify me with who she would like to see.    Return if symptoms worsen or fail to improve.  The patient indicates understanding of these issues and agrees with the plan.  Annabella CHRISTELLA Search, FNP

## 2023-07-10 ENCOUNTER — Other Ambulatory Visit: Payer: Self-pay | Admitting: Family Medicine

## 2023-07-10 DIAGNOSIS — I10 Essential (primary) hypertension: Secondary | ICD-10-CM

## 2023-07-20 ENCOUNTER — Encounter: Payer: Self-pay | Admitting: Family Medicine

## 2023-07-20 ENCOUNTER — Ambulatory Visit (INDEPENDENT_AMBULATORY_CARE_PROVIDER_SITE_OTHER): Admitting: Family Medicine

## 2023-07-20 VITALS — BP 106/73 | HR 82 | Temp 97.9°F | Ht 65.0 in | Wt 246.0 lb

## 2023-07-20 DIAGNOSIS — F411 Generalized anxiety disorder: Secondary | ICD-10-CM

## 2023-07-20 DIAGNOSIS — N1831 Chronic kidney disease, stage 3a: Secondary | ICD-10-CM

## 2023-07-20 DIAGNOSIS — Z6841 Body Mass Index (BMI) 40.0 and over, adult: Secondary | ICD-10-CM

## 2023-07-20 DIAGNOSIS — Z713 Dietary counseling and surveillance: Secondary | ICD-10-CM | POA: Diagnosis not present

## 2023-07-20 DIAGNOSIS — F5104 Psychophysiologic insomnia: Secondary | ICD-10-CM

## 2023-07-20 DIAGNOSIS — G4733 Obstructive sleep apnea (adult) (pediatric): Secondary | ICD-10-CM | POA: Diagnosis not present

## 2023-07-20 DIAGNOSIS — I10 Essential (primary) hypertension: Secondary | ICD-10-CM

## 2023-07-20 DIAGNOSIS — Z23 Encounter for immunization: Secondary | ICD-10-CM

## 2023-07-20 MED ORDER — VILAZODONE HCL 40 MG PO TABS: 40.0000 mg | ORAL_TABLET | Freq: Every day | ORAL | 4 refills | Status: AC

## 2023-07-20 MED ORDER — ZEPBOUND 15 MG/0.5ML ~~LOC~~ SOAJ: 15.0000 mg | SUBCUTANEOUS | 4 refills | Status: AC

## 2023-07-20 MED ORDER — ALPRAZOLAM 0.5 MG PO TABS: 0.2500 mg | ORAL_TABLET | Freq: Every day | ORAL | 5 refills | Status: DC | PRN

## 2023-07-20 MED ORDER — LOSARTAN POTASSIUM 100 MG PO TABS: 100.0000 mg | ORAL_TABLET | Freq: Every day | ORAL | 4 refills | Status: DC

## 2023-07-20 MED ORDER — BUSPIRONE HCL 10 MG PO TABS: 20.0000 mg | ORAL_TABLET | Freq: Two times a day (BID) | ORAL | 4 refills | Status: AC

## 2023-07-20 NOTE — Progress Notes (Signed)
 Subjective: CC: Morbid obesity associated with obstructive sleep apnea Sherri Howard: Sherri Howard [name] is a 68 [place]. female presenting to clinic today for:  1.  Morbid obesity associated with obstructive sleep apnea Patient has been on Zepbound  since February.  She titrated to 15 mg just this past month.  She voices frustration because she cannot get below 246 pounds.  Max weight 288 pounds and 262 pounds at last visit in March. She is worried that she has plateaued.  She is accompanied today by her husband who also provides some insight.  She admits that she does not typically of joy many vegetables.  She recounts her 24-hour food intake and it incorporated cookies, small sandwich on Hawaiian roll, chips.  She has a Sherri. Nunzio a couple of times per week and has coffee daily.  She otherwise drinks plenty of water.  She is not exercising at all.  She does report just feeling generalized low energy but admits that she does not sleep very well and is not sure really what time she falls asleep but typically does not rise until 10 AM.  Not currently being treated with CPAP machine   ROS: Per HPI  Allergies  Allergen Reactions   Ciprofloxacin  Rash   Clindamycin Rash   Influenza Vaccines Rash   Latex Rash   Other     PER PT Sherri Howard, BLOOD PRODUCT REFUSAL   Temazepam Rash   Enablex  [Darifenacin  Hydrobromide Er] Swelling    Causes swelling of patient's tongue   Hydroxyzine Hcl     Per pt was given prior to scheduled tonsillectomy in 1975 and had convulsions under anesthesia   Prednisolone Other (See Comments)    Convulsions per pt   Amoxicillin Diarrhea   Oxycodone  Rash    percocet   Pantoprazole  Rash   Pneumococcal Vaccine Swelling and Rash    Lymph node swelling   Sulfa Antibiotics Rash   Tape Rash    Bandages, etc.   Past Medical History:  Diagnosis Date   Allergy     Anemia    Anxiety    Chronic rhinitis    evaluated by Sherri Sherri Howard (allergy  & asthma center)  note in epic 11-24-2019   Depression    DOE (dyspnea on exertion) 04-05-2020  per pt occasional gets sob sitting, lying, getting up and down, and with exertion   04-05-2020 pt Sherri Howard sent to cardiologist,  Sherri Howard for evalution, note in epic 03-28-2020 (ordered echo and cardiac CT and they have been scheduled   Endometrial polyp    First degree heart block    GERD (gastroesophageal reflux disease)    Headache    Heart murmur    History of adenomatous polyp of colon    History of kidney stones    History of palpitations    previous had event monitor 2017 showed SR/ first degree heart block, no arrhythmia's and normal echo   History of seizure    ED visit 08-22-2014 in epic for possible seizure, per ed note adverse reaction to prednisone  stimulant    Hyperlipidemia    Hypertension    followed by Sherri Howard   and cardiology (nuclear stress test 01-11- 2018 in epic, normal perfusion no ischemia with normal LV function and wall motion, nuclear ef 74%)   IBS (irritable bowel syndrome)    Nephrolithiasis    04-05-2020 per pt bilateral nonobstructive   OSA on CPAP    study in epic 08-31-1998 mild osa, recommended cpap  PMB (postmenopausal bleeding)    Refusal of blood transfusions as patient is Sherri's Howard    Right ureteral calculus 11/07/2015   S/P herniorrhaphy 07/31/2014   Seizures (HCC)    Sleep apnea    SUI (stress urinary incontinence, female)     Current Outpatient Medications:    acetaminophen  (TYLENOL ) 325 MG tablet, Take 650 mg by mouth every 6 (six) hours as needed for mild pain or moderate pain. , Disp: , Rfl:    ALPRAZolam  (XANAX ) 0.5 MG tablet, Take 0.5-1 tablets (0.25-0.5 mg total) by mouth daily as needed for anxiety (PUT ON FILE). May take extra tablet during the day prn panic., Disp: 45 tablet, Rfl: 5   busPIRone  (BUSPAR ) 10 MG tablet, TAKE 2 TABLETS BY MOUTH TWICE DAILY FOR ANXIETY, Disp: 360 tablet, Rfl: 0   carvedilol  (COREG ) 6.25 MG tablet, Take 1 tablet (6.25 mg  total) by mouth 2 (two) times daily., Disp: 180 tablet, Rfl: 3   desloratadine  (CLARINEX ) 5 MG tablet, Take 1 tablet (5 mg total) by mouth daily. To replace claritin for allergies, Disp: 90 tablet, Rfl: 3   diphenhydrAMINE (BENADRYL) 25 mg capsule, Take by mouth., Disp: , Rfl:    famotidine  (PEPCID ) 20 MG tablet, Take one tablet (20mg ) by mouth twice a day, Disp: 180 tablet, Rfl: 3   hyoscyamine  (LEVSIN  SL) 0.125 MG SL tablet, DISSOLVE 1 TABLET UNDER THE TONGUE EVERY 6 HOURS AS NEEDED, Disp: 30 tablet, Rfl: PRN   Lactobacillus (PROBIOTIC ACIDOPHILUS PO), Take 1 tablet by mouth daily., Disp: , Rfl:    losartan  (COZAAR ) 100 MG tablet, TAKE ONE (1) TABLET BY MOUTH EVERY DAY, Disp: 90 tablet, Rfl: 0   Omega-3 Fatty Acids (FISH OIL PO), Take 1 capsule by mouth daily., Disp: , Rfl:    potassium citrate  (UROCIT-K ) 10 MEQ (1080 MG) SR tablet, Take 1 tablet (10 mEq total) by mouth in the morning and at bedtime. Renewal requests to Sherri Sherri Howard in [place], Disp: , Rfl:    rosuvastatin  (CRESTOR ) 20 MG tablet, TAKE ONE (1) TABLET BY MOUTH EVERY DAY, Disp: 90 tablet, Rfl: 1   tirzepatide  (ZEPBOUND ) 15 MG/0.5ML Pen, Inject 15 mg into the skin once a week., Disp: 6 mL, Rfl: 4   triamcinolone  cream (KENALOG ) 0.1 %, Apply 1 Application topically 2 (two) times daily as needed., Disp: 30 g, Rfl: 0   Vilazodone  HCl (VIIBRYD ) 40 MG TABS, TAKE ONE (1) TABLET BY MOUTH EVERY DAY, Disp: 90 tablet, Rfl: 1  Current Facility-Administered Medications:    betamethasone  acetate-betamethasone  sodium phosphate  (CELESTONE ) injection 6 mg, 6 mg, Intramuscular, Once,  Social History   Socioeconomic History   Marital status: Married    Spouse name: Not on file   Number of children: 1   Years of education: Not on file   Highest education level: 12th grade  Occupational History   Not on file  Tobacco Use   Smoking status: Never   Smokeless tobacco: Never  Vaping Use   Vaping status: Never Used  Substance and Sexual  Activity   Alcohol use: Not Currently    Comment: Rare   Drug use: Never   Sexual activity: Not Currently    Birth control/protection: None  Other Topics Concern   Not on file  Social History Narrative   Not on file   Social Drivers of Health   Financial Resource Strain: Low Risk  (07/08/2023)   Overall Financial Resource Strain (CARDIA)    Difficulty of Paying Living Expenses: Not hard at all  Food Insecurity: No Food Insecurity (07/08/2023)   Hunger Vital Sign    Worried About Running Out of Food in the Last Year: Never true    Ran Out of Food in the Last Year: Never true  Transportation Needs: No Transportation Needs (07/08/2023)   PRAPARE - Administrator, Civil Service (Medical): No    Lack of Transportation (Non-Medical): No  Physical Activity: Inactive (07/08/2023)   Exercise Vital Sign    Days of Exercise per Week: 0 days    Minutes of Exercise per Session: Not on file  Stress: Stress Concern Present (07/08/2023)   [employer] of Occupational Health - Occupational Stress Questionnaire    Feeling of Stress: Very much  Social Connections: Socially Integrated (07/08/2023)   Social Connection and Isolation Panel    Frequency of Communication with Friends and Family: Three times a week    Frequency of Social Gatherings with Friends and Family: More than three times a week    Attends Religious Services: More than 4 times per year    Active Member of [employer] or Organizations: Yes    Attends Engineer, structural: More than 4 times per year    Marital Status: Married  Catering manager Violence: Unknown (04/17/2021)   Received from [facility]   HITS    Physically Hurt: Not on file    Insult or Talk Down To: Not on file    Threaten Physical Harm: Not on file    Scream or Curse: Not on file   Family History  Problem Relation Age of Onset   Cancer Mother    Aneurysm Mother    Allergic rhinitis Mother    Breast cancer Mother 60   Atrial fibrillation  Mother    Cancer Father    Hypertension Father    Alzheimer's disease Father    CAD Father    Allergic rhinitis Brother    COPD Maternal Aunt    Eczema Son    Bronchitis Son    Sinusitis Son    Asthma Neg Hx    Urticaria Neg Hx    Immunodeficiency Neg Hx    Atopy Neg Hx    Angioedema Neg Hx     Objective: Office vital signs reviewed. BP 106/73   Pulse 82   Temp 97.9 F (36.6 C)   Ht 5' 5 (1.651 m)   Wt 246 lb (111.6 kg)   SpO2 96%   BMI 40.94 kg/m   Physical Examination:  General: Awake, alert, obese, No acute distress HEENT: Sclera white.  Moist mucous membranes Cardio: regular rate and rhythm, S1S2 heard, no murmurs appreciated Pulm: clear to auscultation bilaterally, no wheezes, rhonchi or rales; normal work of breathing on room air Skin: Some postinflammatory hyperpigmentation noted along thighs bilaterally  Assessment/ Plan: 68 [place]. female   Weight loss counseling, encounter for  BMI 40.0-44.9, adult (HCC) - Plan: tirzepatide  (ZEPBOUND ) 15 MG/0.5ML Pen  OSA on CPAP - Plan: tirzepatide  (ZEPBOUND ) 15 MG/0.5ML Pen  Immunization due - Plan: Varicella-zoster vaccine IM  Generalized anxiety disorder - Plan: ToxASSURE Select 13 (MW), Urine, ALPRAZolam  (XANAX ) 0.5 MG tablet, busPIRone  (BUSPAR ) 10 MG tablet, Vilazodone  HCl (VIIBRYD ) 40 MG TABS  Psychophysiological insomnia - Plan: ToxASSURE Select 13 (MW), Urine, ALPRAZolam  (XANAX ) 0.5 MG tablet  Essential hypertension - Plan: losartan  (COZAAR ) 100 MG tablet  Chronic kidney disease, stage 3a (HCC) - Plan: tirzepatide  (ZEPBOUND ) 15 MG/0.5ML Pen  Zepbound  renewed.  She has had very good weight loss in my opinion  with almost 20 pound weight loss since her last visit.  I think this is appropriate weight loss.  We discussed alternatives including referral to bariatric surgeon but I think with some modification of her diet and really focusing on low-carb, increase protein diet as well as physical activity after meals she  could see a little bit momentum  She was given her second shingles vaccination today.  With regards to her sleep issues, I have renewed her medications but I have instructed her to consider melatonin 3 to 5 mg nightly and given that she has obstructive sleep apnea she should strongly consider CPAP therapy as well, she notes she was not using that.  UDS and CSA were updated as per office policy and the national narcotic database was reviewed with no red flags.  Postdated prescription for alprazolam  sent  Blood pressure is controlled.  Medication renewed.  Continue to follow-up with nephrology for CKD 3 A  Sherri Sweetland CHRISTELLA Fielding, [facility] [phone]

## 2023-07-23 LAB — TOXASSURE SELECT 13 (MW), URINE

## 2023-07-24 ENCOUNTER — Ambulatory Visit: Payer: Self-pay | Admitting: Family Medicine

## 2023-07-27 DIAGNOSIS — K219 Gastro-esophageal reflux disease without esophagitis: Secondary | ICD-10-CM | POA: Diagnosis not present

## 2023-07-27 DIAGNOSIS — K529 Noninfective gastroenteritis and colitis, unspecified: Secondary | ICD-10-CM | POA: Diagnosis not present

## 2023-07-27 DIAGNOSIS — Z860100 Personal history of colon polyps, unspecified: Secondary | ICD-10-CM | POA: Diagnosis not present

## 2023-09-01 ENCOUNTER — Other Ambulatory Visit: Payer: Self-pay | Admitting: Adult Health

## 2023-09-02 DIAGNOSIS — K5730 Diverticulosis of large intestine without perforation or abscess without bleeding: Secondary | ICD-10-CM | POA: Diagnosis not present

## 2023-09-02 DIAGNOSIS — K648 Other hemorrhoids: Secondary | ICD-10-CM | POA: Diagnosis not present

## 2023-09-02 DIAGNOSIS — Z09 Encounter for follow-up examination after completed treatment for conditions other than malignant neoplasm: Secondary | ICD-10-CM | POA: Diagnosis not present

## 2023-09-02 DIAGNOSIS — Z860101 Personal history of adenomatous and serrated colon polyps: Secondary | ICD-10-CM | POA: Diagnosis not present

## 2023-09-02 DIAGNOSIS — Z980 Intestinal bypass and anastomosis status: Secondary | ICD-10-CM | POA: Diagnosis not present

## 2023-09-02 DIAGNOSIS — D122 Benign neoplasm of ascending colon: Secondary | ICD-10-CM | POA: Diagnosis not present

## 2023-09-02 DIAGNOSIS — D123 Benign neoplasm of transverse colon: Secondary | ICD-10-CM | POA: Diagnosis not present

## 2023-09-02 LAB — HM COLONOSCOPY

## 2023-09-04 DIAGNOSIS — D122 Benign neoplasm of ascending colon: Secondary | ICD-10-CM | POA: Diagnosis not present

## 2023-09-04 DIAGNOSIS — D123 Benign neoplasm of transverse colon: Secondary | ICD-10-CM | POA: Diagnosis not present

## 2023-09-04 MED ORDER — ROSUVASTATIN CALCIUM 20 MG PO TABS
20.0000 mg | ORAL_TABLET | Freq: Every day | ORAL | 0 refills | Status: DC
Start: 2023-09-04 — End: 2023-12-03

## 2023-11-18 ENCOUNTER — Other Ambulatory Visit: Payer: Self-pay | Admitting: Adult Health

## 2023-11-19 ENCOUNTER — Encounter: Payer: Self-pay | Admitting: Family Medicine

## 2023-11-21 ENCOUNTER — Encounter: Payer: Self-pay | Admitting: Cardiology

## 2023-11-23 MED ORDER — CARVEDILOL 6.25 MG PO TABS: 6.2500 mg | ORAL_TABLET | Freq: Two times a day (BID) | ORAL | 0 refills | Status: DC

## 2023-11-23 NOTE — Telephone Encounter (Signed)
 Spoke with husband to clarify pharmacy. Rx sent for Carvedilol .

## 2023-11-24 ENCOUNTER — Encounter: Payer: Self-pay | Admitting: "Endocrinology

## 2023-11-26 ENCOUNTER — Ambulatory Visit (HOSPITAL_COMMUNITY)
Admission: RE | Admit: 2023-11-26 | Discharge: 2023-11-26 | Disposition: A | Discharge status: Home / Self Care | Source: Ambulatory Visit | Attending: "Endocrinology | Admitting: "Endocrinology

## 2023-11-26 DIAGNOSIS — M8589 Other specified disorders of bone density and structure, multiple sites: Secondary | ICD-10-CM | POA: Insufficient documentation

## 2023-11-26 DIAGNOSIS — M8580 Other specified disorders of bone density and structure, unspecified site: Secondary | ICD-10-CM | POA: Diagnosis not present

## 2023-12-01 ENCOUNTER — Other Ambulatory Visit: Payer: Self-pay | Admitting: Adult Health

## 2023-12-01 ENCOUNTER — Other Ambulatory Visit: Payer: Self-pay | Admitting: *Deleted

## 2023-12-01 DIAGNOSIS — J3089 Other allergic rhinitis: Secondary | ICD-10-CM

## 2023-12-02 ENCOUNTER — Other Ambulatory Visit

## 2023-12-08 ENCOUNTER — Inpatient Hospital Stay: Attending: Hematology

## 2023-12-08 ENCOUNTER — Other Ambulatory Visit

## 2023-12-08 ENCOUNTER — Inpatient Hospital Stay

## 2023-12-08 DIAGNOSIS — Z800 Family history of malignant neoplasm of digestive organs: Secondary | ICD-10-CM | POA: Insufficient documentation

## 2023-12-08 DIAGNOSIS — Z8052 Family history of malignant neoplasm of bladder: Secondary | ICD-10-CM | POA: Diagnosis not present

## 2023-12-08 DIAGNOSIS — Z8042 Family history of malignant neoplasm of prostate: Secondary | ICD-10-CM | POA: Insufficient documentation

## 2023-12-08 DIAGNOSIS — Z801 Family history of malignant neoplasm of trachea, bronchus and lung: Secondary | ICD-10-CM | POA: Insufficient documentation

## 2023-12-08 DIAGNOSIS — Z860101 Personal history of adenomatous and serrated colon polyps: Secondary | ICD-10-CM | POA: Diagnosis not present

## 2023-12-08 DIAGNOSIS — Z803 Family history of malignant neoplasm of breast: Secondary | ICD-10-CM | POA: Diagnosis not present

## 2023-12-08 DIAGNOSIS — Z1379 Encounter for other screening for genetic and chromosomal anomalies: Secondary | ICD-10-CM | POA: Diagnosis present

## 2023-12-08 NOTE — Progress Notes (Unsigned)
 REFERRING PROVIDER: Saintclair Jasper, MD [address] [place],  [place] [address]  PRIMARY PROVIDER:  Jolinda Norene HERO, DO  PRIMARY REASON FOR VISIT:  1. History of adenomatous polyp of colon   2. Family history of stomach cancer   3. Family history of prostate cancer   4. Family history of lung cancer   5. Family history of breast cancer   6. Family history of bladder cancer    HISTORY OF PRESENT ILLNESS:   Sherri Howard, a 68 y.o. female, was seen on 12/08/2023 for a [place] cancer genetics consultation at the request of Dr. Saintclair due to a personal history of colon polyps and a family history of cancer. She had a large polyp identified on colonoscopy in 2014 and had part of her colon removed. On most recent colonoscopy she had 26 polyps identified which is what prompted the referral. Please see relevant medical history for more details.  Sherri Howard presents to clinic today to discuss the possibility of a hereditary predisposition to cancer, genetic testing, and to further clarify her future cancer risks, as well as potential cancer risks for family members.    RELEVANT MEDICAL HISTORY AND RISK FACTORS:  Menarche was at age [age] years old.  First live birth at age 51.  OCP use for approximately 8-12 years.  Ovaries intact: yes.  Hysterectomy: no.  Menopausal status: postmenopausal.  HRT use: 0 years. Mammogram within the last year: had one 08/24/2020 Breast density: B Number of breast biopsies: reports having a breast biopsy in 8th grade due to a breast knot and also reports having additional breast biopsies in adulthood. Up to date with pelvic exams: yes. Colonoscopy:  2014 (age 20): 30mm "carpet-like" cecal polyps, and 8mm polyp. Cecal polyp could not be removed via snare. Removed COLECTOMY ILEO LAPAROSCOPIC HAND ASSISTED/ (N/A)  Reports she did not have one in-between this time period 09/03/2023: 26 sessile polyps found in the ascending colon, hepatic fissure, and  transverse colon. Polyps were 3-65mm. Patient reports not all polyps were removed and she has to have another procedure to remove the rest Any excessive radiation exposure in the past: no Other cancer screenings: has dermatology appointment scheduled, has a spot she is concerned about.  Exposures: no.   Past Medical History:  Diagnosis Date   Allergy     Anemia    Anxiety    Chronic rhinitis    evaluated by dr y. luke ([facility]) note in epic 11-24-2019   Depression    DOE (dyspnea on exertion) 04-05-2020  per pt occasional gets sob sitting, lying, getting up and down, and with exertion   04-05-2020 pt pcp sent to cardiologist,  dr pietro for evalution, note in epic 03-28-2020 (ordered echo and cardiac CT and they have been scheduled   Endometrial polyp    Family history of bladder cancer    Family history of breast cancer    Family history of lung cancer    Family history of prostate cancer    Family history of stomach cancer    First degree heart block    GERD (gastroesophageal reflux disease)    Headache    Heart murmur    History of adenomatous polyp of colon    History of kidney stones    History of palpitations    previous had event monitor 2017 showed SR/ first degree heart block, no arrhythmia's and normal echo   History of seizure    ED visit 08-22-2014  in epic for possible seizure, per ed note adverse reaction to prednisone  stimulant    Hyperlipidemia    Hypertension    followed by pcp   and cardiology (nuclear stress test 01-11- 2018 in epic, normal perfusion no ischemia with normal LV function and wall motion, nuclear ef 74%)   IBS (irritable bowel syndrome)    Nephrolithiasis    04-05-2020 per pt bilateral nonobstructive   OSA on CPAP    study in epic 08-31-1998 mild osa, recommended cpap   PMB (postmenopausal bleeding)    Refusal of blood transfusions as patient is Jehovah's Witness    Right ureteral calculus 11/07/2015   S/P herniorrhaphy  07/31/2014   Seizures (HCC)    Sleep apnea    SUI (stress urinary incontinence, female)     Past Surgical History:  Procedure Laterality Date   APPENDECTOMY     BREAST SURGERY  1974   lumpectomy, per pt benign   COLON SURGERY     CYSTOSCOPY/RETROGRADE/URETEROSCOPY  02-01-2008  @[facility]    CYSTOSCOPY/URETEROSCOPY/HOLMIUM LASER/STENT PLACEMENT  2008;  11-07-2015 @[facility]    HERNIA REPAIR     HYSTEROSCOPY WITH D & C N/A 04/09/2020   Procedure: DILATATION AND CURETTAGE /HYSTEROSCOPY;  Surgeon: Jannis Kate Norris, MD;  Location[facility];  Service: Gynecology;  Laterality: N/A;   LAPAROSCOPIC CHOLECYSTECTOMY  2014   LAPAROSCOPIC ILEOCECECTOMY  09/07/2012   @ [facility]   W/  APPENDECTOMY (done for large cecal polyp, non-malignant)   OVARIAN CYST SURGERY  1988   unsure which side   TONSILLECTOMY  05/1973   VENTRAL HERNIA REPAIR  07-31-2014   @[facility]    incisional  (open)    Social History   Socioeconomic History   Marital status: Married    Spouse name: Not on file   Number of children: 1   Years of education: Not on file   Highest education level: 12th grade  Occupational History   Not on file  Tobacco Use   Smoking status: Never   Smokeless tobacco: Never  Vaping Use   Vaping status: Never Used  Substance and Sexual Activity   Alcohol use: Not Currently    Comment: Rare   Drug use: Never   Sexual activity: Not Currently    Birth control/protection: None  Other Topics Concern   Not on file  Social History Narrative   Not on file   Social Drivers of Health   Financial Resource Strain: Low Risk  (07/08/2023)   Overall Financial Resource Strain (CARDIA)    Difficulty of Paying Living Expenses: Not hard at all  Food Insecurity: No Food Insecurity (07/08/2023)   Hunger Vital Sign    Worried About Running Out of Food in the Last Year: Never true    Ran Out of Food in the Last Year: Never true  Transportation Needs: No Transportation Needs (07/08/2023)   PRAPARE -  Administrator, Civil Service (Medical): No    Lack of Transportation (Non-Medical): No  Physical Activity: Inactive (07/08/2023)   Exercise Vital Sign    Days of Exercise per Week: 0 days    Minutes of Exercise per Session: Not on file  Stress: Stress Concern Present (07/08/2023)   [employer] of Occupational Health - Occupational Stress Questionnaire    Feeling of Stress: Very much  Social Connections: Socially Integrated (07/08/2023)   Social Connection and Isolation Panel    Frequency of Communication with Friends and Family: Three times a week    Frequency of Social Gatherings  with Friends and Family: More than three times a week    Attends Religious Services: More than 4 times per year    Active Member of Clubs or Organizations: Yes    Attends Engineer, Structural: More than 4 times per year    Marital Status: Married     FAMILY HISTORY:  We obtained a detailed, 4-generation family history.  Significant diagnoses are listed below: Family History  Problem Relation Age of Onset   Cancer Mother    Aneurysm Mother    Allergic rhinitis Mother    Breast cancer Mother 89       right breast cancer at 54, R mastectomy. L breast cancer at 35   Atrial fibrillation Mother    Skin cancer Mother 65       skin cancer of ear at 51, part of ear removed. Skin cancer of side of face at 60, removed   Stomach cancer Father 54       in army, Agent Orange exposure   Hypertension Father    Alzheimer's disease Father    CAD Father    Bladder Cancer Father 19       in army, Agent Orange exposure   Prostate cancer Father 51       in army, Agent Orange exposure   Allergic rhinitis Brother    COPD Paternal Aunt    Cancer Paternal Aunt 41       Blood cancer   Prostate cancer Paternal Uncle 27   Lung cancer Maternal Grandfather        worked in haematologist mines   Eczema Son    Bronchitis Son    Sinusitis Son    Asthma Neg Hx    Urticaria Neg Hx    Immunodeficiency Neg  Hx    Atopy Neg Hx    Angioedema Neg Hx     She reports the following family history:  Her mother had breast cancer age 38 in the right breast. It was a small spot, aggressive. She had her right breast removed. She had another breast cancer at 72 in the left breast. She also had skin cancer of the ear at age 64 which was removed and another skin cancer on the side of her face at age 82. She is not sure what types of skin cancers these were. Her maternal grandfather had lung cancer and worked in the coal mines Her father was diagnosed with stomach cancer at 22, prostate cancer at 62, and bladder cancer at 73. She reports her father was in the army and had exposure to agent orange.  Her paternal aunt Ellouise had blood cancer at age 78 Her paternal uncle had prostate cancer at 33  Sherri Howard is unaware of previous family history of genetic testing for hereditary cancer risks. There is no reported Ashkenazi Jewish ancestry. There is no known consanguinity.  GENETIC COUNSELING ASSESSMENT:  Sherri Howard is a 68 y.o. female with a personal history of colon polyps and a family history of breast cancer (mother 2 breast primaries) which is somewhat suggestive of a hereditary cancer condition and predisposition to cancer given this history. We, therefore, discussed and recommended the following at today's visit.   DISCUSSION:  We discussed that polyps are relatively common, as 40% of adults over the age of 18 have colon polyps. Most people typically have a few polyps. There are different types of colon polyps.Some are precancerous meaning they have the potential to turn into cancer years later, and  others do not have the potential to form into cancer, but may still grow cause issues. The types of polyps that were found in Sherri Howard were precancerous polyps. We discussed that most polyps are random/sporadic, likley due to a combination of random chance, environment and small genetic changes. Risk factors for  colon polyps include ging, smoking, heavy alcohol use, high fat + low fiber diet, lack of exercise, obesity, diabetes, IBS. Some people have genetic mutations that increase the number of colon polyps someone can have and therefore increases the risk for colon cancer, as these polyps can turn into cancer and can turn into cancer quicker. We discussed that genetic testing can beneficial for several reasons, including clarifying specific cancer risks, identifying potential screening and risk-reduction options that may be appropriate, and to understand if other family members could be at risk for cancer and allow them to undergo genetic testing to clarify their cancer risks.   We discussed that, in general, most cancer is not inherited in families, but instead is sporadic or familial. Sporadic cancers occur by chance and typically happen at older ages (>50 years) as this type of cancer is caused by genetic changes acquired during an individual's lifetime. Some families have more cancers than would be expected by chance; however, the ages or types of cancer are not consistent with a known genetic mutation or known genetic mutations have been ruled out. This type of familial cancer is thought to be due to a combination of multiple genetic, environmental, hormonal, and lifestyle factors. While this combination of factors likely increases the risk of cancer, the exact source of this risk is not currently identifiable or testable.  We discussed that 5 - 10% of breast is hereditary. Cancer risks and management strategies are gene specific. We discussed that genetic testing can beneficial for several reasons, including clarifying specific cancer risks, identifying potential screening and risk-reduction options that may be appropriate, and to understand if other family members could be at risk for cancer and allow them to undergo genetic testing to clarify their cancer risks.   We reviewed the characteristics, features  and inheritance patterns of hereditary cancer and colon polyposis syndromes. We also discussed genetic testing, including the appropriate family members to test, the process of testing, insurance coverage and turn-around-time for results. We discussed the implications of a negative, positive, carrier and/or variant of uncertain significant result.   Sherri Howard was offered the [facility] + RNAinsight gene panel which includes sequencing, rearrangement analysis, and RNA analysis for the following 40 genes: APC, ATM, BAP1, BARD1, BMPR1A, BRCA1, BRCA2, BRIP1, CDH1, CDKN2A, CHEK2, FH, FLCN, MET, MLH1, MSH2, MSH6, MUTYH, NF1, NTHL1, PALB2, PMS2, PTEN, RAD51C, RAD51D, RPS20, SMAD4, STK11, TP53, TSC1, TSC2, and VHL (sequencing and deletion/duplication); AXIN2, HOXB13, MBD4, MSH3, POLD1 and POLE (sequencing only); EPCAM and GREM1 (deletion/duplication only).   Sherri Howard was also offered the [facility]-Expanded + RNAinsight gene panel which includes sequencing, rearrangement, and RNA analysis for the following 77 genes: AIP, ALK, APC, ATM, AXIN2, BAP1, BARD1, BMPR1A, BRCA1, BRCA2, BRIP1, CDC73, CDH1, CDK4, CDKN1B, CDKN2A, CEBPA, CHEK2, CTNNA1, DDX41, DICER1, ETV6, FH, FLCN, GATA2, LZTR1, MAX, MBD4, MEN1, MET, MLH1, MSH2, MSH3, MSH6, MUTYH, NF1, NF2, NTHL1, PALB2, PHOX2B, PMS2, POT1, PRKAR1A, PTCH1, PTEN, RAD51C, RAD51D, RB1, RET, RPS20, RUNX1, SDHA, SDHAF2, SDHB, SDHC, SDHD, SMAD4, SMARCA4, SMARCB1, SMARCE1, STK11, SUFU, TMEM127, TP53, TSC1, TSC2, VHL, and WT1 (sequencing and deletion/duplication); EGFR, HOXB13, KIT, MITF, PDGFRA, POLD1, and POLE (sequencing only); EPCAM and GREM1 (deletion/duplication only).  Sherri Howard was informed of the benefits and limitations of each panel, including that expanded pan-cancer panels contain genes may not have clear management guidelines at this point in time. We also discussed that as the number of genes included on a panel increases, the chances of variants of uncertain  significance increases.  Based on Sherri Howard's personal history of colon polyps, she meets medical criteria for genetic testing. she meets criteria due to have more than 20 colon polyps in her lifetime. Despite that she meets criteria, she may still have an out of pocket cost. We discussed that if her out of pocket cost for testing is over $100, the laboratory will call and confirm whether she wants to proceed with testing.  If the out of pocket cost of testing is less than $100 she will be billed by the genetic testing laboratory.   We discussed that some people do not want to undergo genetic testing due to fear of genetic discrimination.  The Genetic Information Nondiscrimination Act (GINA) was signed into federal law in 2008. GINA prohibits health insurers and most employers from discriminating against individuals based on genetic information (including the results of genetic tests and family history information). According to GINA, health insurance companies cannot consider genetic information to be a preexisting condition, nor can they use it to make decisions regarding coverage or rates. GINA also makes it illegal for most employers to use genetic information in making decisions about hiring, firing, promotion, or terms of employment. It is important to note that GINA does not offer protections for life insurance, disability insurance, or long-term care insurance. GINA does not apply to those in the [employer], those who work for companies with less than 15 employees, and new life insurance or long-term disability insurance policies.  Health status due to a cancer diagnosis is not protected under GINA. More information about GINA can be found by visiting [email or web address] .  PLAN: Despite our recommendation, Sherri Howard did not wish to pursue genetic testing at today's visit. She is very interested in the testing, but her and her husband wanted additional time to think through the potential insurance  considerations. My contact card and the contact card of [facility]) was provided. We understand this decision and remain available to coordinate genetic testing at any time in the future. We, therefore, recommend Sherri Howard continue to follow the cancer screening guidelines given by her primary healthcare provider.  Mr. Ende (Vickie's husband) that his mother had colon cancer in her 30s and he has multiple maternal relatives that have had colon cancer. A comprehensive family history was not taken due to time. We discussed that he meets criteria for genetic testing as well based on this history. He will let us  know if he'd also like to do genetic testing as well.   Lastly, we encouraged Sherri Howard to remain in contact with cancer genetics annually so that we can continuously update the family history and inform her of any changes in cancer genetics and testing that may be of benefit for this family.   Sherri Howard questions were answered to her satisfaction today. Our contact information was provided should additional questions or concerns arise. Thank you for the referral and allowing us  to share in the care of your patient.   Fiorela Pelzer R. Bluford, MS, [facility] Certified [email or web address] phone: [phone]  I personally spent a total of 65 minutes in the care of the patient today including preparing to see the patient, getting/reviewing separately obtained  history, counseling and educating, and documenting clinical information in the EHR. The patient brought her husband. Drs. Lanny Stalls, and/or Gudena were available for questions, if needed.   _______________________________________________________________________ For Office Staff:  Number of people involved in session: 2 Was an Intern/ student involved with case: no

## 2023-12-09 DIAGNOSIS — Z1231 Encounter for screening mammogram for malignant neoplasm of breast: Secondary | ICD-10-CM | POA: Diagnosis not present

## 2023-12-09 DIAGNOSIS — R92323 Mammographic fibroglandular density, bilateral breasts: Secondary | ICD-10-CM | POA: Diagnosis not present

## 2023-12-09 LAB — HM MAMMOGRAPHY

## 2023-12-11 DIAGNOSIS — D351 Benign neoplasm of parathyroid gland: Secondary | ICD-10-CM | POA: Diagnosis not present

## 2023-12-12 LAB — CREATININE, URINE, 24 HOUR
Creatinine, 24H Ur: 883 mg/(24.h) (ref 800–1800)
Creatinine, Urine: 147.2 mg/dL

## 2023-12-12 LAB — CALCIUM, URINE, 24 HOUR
Calcium, 24H Urine: 70 mg/(24.h) (ref 0–320)
Calcium, Urine: 11.6 mg/dL

## 2023-12-13 LAB — COMPREHENSIVE METABOLIC PANEL WITH GFR
ALT: 16 IU/L (ref 0–32)
AST: 21 IU/L (ref 0–40)
Albumin: 4.4 g/dL (ref 3.9–4.9)
Alkaline Phosphatase: 58 IU/L (ref 49–135)
BUN/Creatinine Ratio: 12 (ref 12–28)
BUN: 16 mg/dL (ref 8–27)
Bilirubin Total: 0.6 mg/dL (ref 0.0–1.2)
CO2: 24 mmol/L (ref 20–29)
Calcium: 9.9 mg/dL (ref 8.7–10.3)
Chloride: 105 mmol/L (ref 96–106)
Creatinine, Ser: 1.32 mg/dL — ABNORMAL HIGH (ref 0.57–1.00)
Globulin, Total: 2.5 g/dL (ref 1.5–4.5)
Glucose: 76 mg/dL (ref 70–99)
Potassium: 5.4 mmol/L — ABNORMAL HIGH (ref 3.5–5.2)
Sodium: 143 mmol/L (ref 134–144)
Total Protein: 6.9 g/dL (ref 6.0–8.5)
eGFR: 45 mL/min/1.73 — ABNORMAL LOW (ref >59)

## 2023-12-13 LAB — PTH, INTACT AND CALCIUM: PTH: 47 pg/mL (ref 15–65)

## 2023-12-15 ENCOUNTER — Ambulatory Visit: Payer: Self-pay | Admitting: Family Medicine

## 2023-12-16 ENCOUNTER — Telehealth: Payer: Self-pay | Admitting: Family Medicine

## 2023-12-16 DIAGNOSIS — R92322 Mammographic fibroglandular density, left breast: Secondary | ICD-10-CM | POA: Diagnosis not present

## 2023-12-16 DIAGNOSIS — N6320 Unspecified lump in the left breast, unspecified quadrant: Secondary | ICD-10-CM | POA: Diagnosis not present

## 2023-12-16 NOTE — Telephone Encounter (Signed)
 I'm not sure exactly what they need ordered. Radiology usually does this. Canyou help?

## 2023-12-16 NOTE — Telephone Encounter (Unsigned)
 Copied from CRM #[id number]. Topic: Appointments - Scheduling Inquiry for Clinic >> Dec 16, 2023  8:52 AM Diannia H wrote: Reason for CRM: Patient is calling because she is needing a request sent in for an additional mammogram. They found something on her left breast. She has to go back in this afternoon so she needs the request done as soon as possible. Could you assist? Patients callback number is [phone]

## 2023-12-17 ENCOUNTER — Encounter: Payer: Self-pay | Admitting: Family Medicine

## 2023-12-17 ENCOUNTER — Other Ambulatory Visit: Payer: Self-pay | Admitting: Cardiology

## 2023-12-18 NOTE — Progress Notes (Signed)
 Patient has had done (report scanned in chart today) and is scheduled for biopsy

## 2023-12-18 NOTE — Telephone Encounter (Signed)
 Orders where sent over for this patient - they were signed and faxed to the office

## 2023-12-24 ENCOUNTER — Encounter: Payer: Self-pay | Admitting: "Endocrinology

## 2023-12-24 ENCOUNTER — Ambulatory Visit: Admitting: "Endocrinology

## 2023-12-24 VITALS — BP 104/78 | HR 64 | Ht 65.0 in | Wt 234.2 lb

## 2023-12-24 DIAGNOSIS — E212 Other hyperparathyroidism: Secondary | ICD-10-CM | POA: Diagnosis not present

## 2023-12-24 DIAGNOSIS — D351 Benign neoplasm of parathyroid gland: Secondary | ICD-10-CM | POA: Diagnosis not present

## 2023-12-24 DIAGNOSIS — M8580 Other specified disorders of bone density and structure, unspecified site: Secondary | ICD-10-CM | POA: Diagnosis not present

## 2023-12-24 NOTE — Progress Notes (Signed)
 12/24/2023, 2:29 PM  Endocrinology follow-up note  Sherri Howard is a 68 y.o.-year-old female, referred by her  Jolinda Norene HERO, DO  . She is here to review recent work-up results after she was seen in consultation for hypercalcemia/hyperparathyroidism.   Past Medical History:  Diagnosis Date   Allergy     Anemia    Anxiety    Chronic rhinitis    evaluated by dr y. luke ([facility]) note in epic 11-24-2019   Depression    DOE (dyspnea on exertion) 04-05-2020  per pt occasional gets sob sitting, lying, getting up and down, and with exertion   04-05-2020 pt pcp sent to cardiologist,  dr pietro for evalution, note in epic 03-28-2020 (ordered echo and cardiac CT and they have been scheduled   Endometrial polyp    Family history of bladder cancer    Family history of breast cancer    Family history of lung cancer    Family history of prostate cancer    Family history of stomach cancer    First degree heart block    GERD (gastroesophageal reflux disease)    Headache    Heart murmur    History of adenomatous polyp of colon    History of kidney stones    History of palpitations    previous had event monitor 2017 showed SR/ first degree heart block, no arrhythmia's and normal echo   History of seizure    ED visit 08-22-2014 in epic for possible seizure, per ed note adverse reaction to prednisone  stimulant    Hyperlipidemia    Hypertension    followed by pcp   and cardiology (nuclear stress test 01-11- 2018 in epic, normal perfusion no ischemia with normal LV function and wall motion, nuclear ef 74%)   IBS (irritable bowel syndrome)    Nephrolithiasis    04-05-2020 per pt bilateral nonobstructive   OSA on CPAP    study in epic 08-31-1998 mild osa, recommended cpap   PMB (postmenopausal bleeding)    Refusal of blood transfusions as patient is Jehovah's Witness    Right ureteral calculus 11/07/2015    S/P herniorrhaphy 07/31/2014   Seizures (HCC)    Sleep apnea    SUI (stress urinary incontinence, female)     Past Surgical History:  Procedure Laterality Date   APPENDECTOMY     BREAST SURGERY  1974   lumpectomy, per pt benign   COLON SURGERY     CYSTOSCOPY/RETROGRADE/URETEROSCOPY  02-01-2008  @[facility]    CYSTOSCOPY/URETEROSCOPY/HOLMIUM LASER/STENT PLACEMENT  2008;  11-07-2015 @[facility]    HERNIA REPAIR     HYSTEROSCOPY WITH D & C N/A 04/09/2020   Procedure: DILATATION AND CURETTAGE /HYSTEROSCOPY;  Surgeon: Jannis Kate Norris, MD;  Location: [facility];  Service: Gynecology;  Laterality: N/A;   LAPAROSCOPIC CHOLECYSTECTOMY  2014   LAPAROSCOPIC ILEOCECECTOMY  09/07/2012   @ [facility]   W/  APPENDECTOMY (done for large cecal polyp, non-malignant)   OVARIAN CYST SURGERY  1988   unsure which side   TONSILLECTOMY  05/1973   VENTRAL HERNIA REPAIR  07-31-2014   @[facility]    incisional  (open)  Social History   Tobacco Use   Smoking status: Never   Smokeless tobacco: Never  Vaping Use   Vaping status: Never Used  Substance Use Topics   Alcohol use: Not Currently    Comment: Rare   Drug use: Never    Family History  Problem Relation Age of Onset   Cancer Mother    Aneurysm Mother    Allergic rhinitis Mother    Breast cancer Mother 65       right breast cancer at 92, R mastectomy. L breast cancer at 13   Atrial fibrillation Mother    Skin cancer Mother 60       skin cancer of ear at 70, part of ear removed. Skin cancer of side of face at 60, removed   Stomach cancer Father 74       in army, Agent Orange exposure   Hypertension Father    Alzheimer's disease Father    CAD Father    Bladder Cancer Father 14       in army, Agent Orange exposure   Prostate cancer Father 58       in army, Agent Orange exposure   Allergic rhinitis Brother    COPD Paternal Aunt    Cancer Paternal Aunt 28       Blood cancer   Prostate cancer Paternal Uncle 90   Lung cancer Maternal  Grandfather        worked in haematologist mines   Eczema Son    Bronchitis Son    Sinusitis Son    Asthma Neg Hx    Urticaria Neg Hx    Immunodeficiency Neg Hx    Atopy Neg Hx    Angioedema Neg Hx     Outpatient Encounter Medications as of 12/24/2023  Medication Sig   Cholecalciferol (VITAMIN D ) 50 MCG (2000 UT) CAPS Take 2,000 Units by mouth daily with lunch.   acetaminophen  (TYLENOL ) 325 MG tablet Take 650 mg by mouth every 6 (six) hours as needed for mild pain or moderate pain.    ALPRAZolam  (XANAX ) 0.5 MG tablet Take 0.5-1 tablets (0.25-0.5 mg total) by mouth daily as needed for anxiety (PUT ON FILE). May take extra tablet during the day prn panic.   busPIRone  (BUSPAR ) 10 MG tablet Take 2 tablets (20 mg total) by mouth 2 (two) times daily.   carvedilol  (COREG ) 6.25 MG tablet TAKE 1 TABLET BY MOUTH TWICE DAILY WITH MEALS   desloratadine  (CLARINEX ) 5 MG tablet TAKE ONE (1) TABLET BY MOUTH EVERY DAY   diphenhydrAMINE (BENADRYL) 25 mg capsule Take by mouth.   famotidine  (PEPCID ) 20 MG tablet Take one tablet (20mg ) by mouth twice a day   hyoscyamine  (LEVSIN  SL) 0.125 MG SL tablet DISSOLVE 1 TABLET UNDER THE TONGUE EVERY 6 HOURS AS NEEDED   Lactobacillus (PROBIOTIC ACIDOPHILUS PO) Take 1 tablet by mouth daily.   losartan  (COZAAR ) 100 MG tablet Take 1 tablet (100 mg total) by mouth daily.   Omega-3 Fatty Acids (FISH OIL PO) Take 1 capsule by mouth daily.   potassium citrate  (UROCIT-K ) 10 MEQ (1080 MG) SR tablet Take 1 tablet (10 mEq total) by mouth in the morning and at bedtime. Renewal requests to Dr Rachele in [place]   rosuvastatin  (CRESTOR ) 20 MG tablet TAKE ONE (1) TABLET BY MOUTH EVERY DAY   tirzepatide  (ZEPBOUND ) 15 MG/0.5ML Pen Inject 15 mg into the skin once a week.   triamcinolone  cream (KENALOG ) 0.1 % Apply 1 Application topically 2 (two) times daily as  needed.   Vilazodone  HCl (VIIBRYD ) 40 MG TABS Take 1 tablet (40 mg total) by mouth daily.   Facility-Administered Encounter  Medications as of 12/24/2023  Medication   betamethasone  acetate-betamethasone  sodium phosphate  (CELESTONE ) injection 6 mg    Allergies  Allergen Reactions   Ciprofloxacin  Rash   Clindamycin Rash   Influenza Vaccines Rash   Latex Rash   Other     PER PT JEHOVAH WITNESS, BLOOD PRODUCT REFUSAL   Temazepam Rash   Enablex  [Darifenacin  Hydrobromide Er] Swelling    Causes swelling of patient's tongue   Hydroxyzine Hcl     Per pt was given prior to scheduled tonsillectomy in 1975 and had convulsions under anesthesia   Prednisolone Other (See Comments)    Convulsions per pt   Amoxicillin Diarrhea   Oxycodone  Rash    percocet   Pantoprazole  Rash   Pneumococcal Vaccine Swelling and Rash    Lymph node swelling   Sulfa Antibiotics Rash   Tape Rash    Bandages, etc.     HPI  Sherri Howard was diagnosed with hypercalcemia in 2023.  Patient has no previously history of nephrolithiasis.  She denies history of pituitary, thyroid , adrenal dysfunctions.  She was found to have high PTH of 90 with a background of mild renal insufficiency.  She was put on expectant management for possibility of secondary hyperparathyroidism.   She has osteopenia documented on the repeat bone density before this visit.  Her labs are consistent with normal calcium  at 9.9 associated with PTH of 47. She does not have any new complaints today. She has had a parathyroid  scan which showed possible adenoma on the right superior gland.   During her prior visit, she was sent for 24-hour urine calcium  measurement which did not reveal hypercalciuria, it was 70 mg / 24 hours. She denies any history of fragility fractures. -no family history of pituitary, adrenal, thyroid   dysfunctions.  She was recently diagnosed with breast lump and multiple colon polyps.  she is not on HCTZ or other thiazide therapy.  No history of  vitamin D  deficiency.   she is not on calcium  supplements.  -Her other medical problems include high blood  pressure on medications including amlodipine , carvedilol , losartan , hyperlipidemia on Crestor , omega-3 fatty acids. Her records indicate A1c of 5.1% recently, previously 5.8%. Patient is on Zepbound  15 mg every 7 days prescribed by her other providers.  She presents with some weight loss.    ROS:  Constitutional: + Minimally fluctuating body weight,  no fatigue, no subjective hyperthermia, no subjective hypothermia   PE: BP 104/78   Pulse 64   Ht 5' 5 (1.651 m)   Wt 234 lb 3.2 oz (106.2 kg)   BMI 38.97 kg/m , Body mass index is 38.97 kg/m. Wt Readings from Last 3 Encounters:  12/24/23 234 lb 3.2 oz (106.2 kg)  07/20/23 246 lb (111.6 kg)  07/08/23 246 lb (111.6 kg)    Constitutional: + BMI 41.3, not in acute distress, normal state of mind Eyes: PERRLA, EOMI, no exophthalmos ENT: moist mucous membranes, no gross thyromegaly, no gross cervical lymphadenopathy    CMP ( most recent) CMP     Component Value Date/Time   NA 143 12/11/2023 1503   K 5.4 (H) 12/11/2023 1503   CL 105 12/11/2023 1503   CO2 24 12/11/2023 1503   GLUCOSE 76 12/11/2023 1503   GLUCOSE 111 (H) 04/09/2020 0920   BUN 16 12/11/2023 1503   CREATININE 1.32 (H) 12/11/2023 1503  CREATININE 1.07 (H) 03/08/2019 1540   CALCIUM  9.9 12/11/2023 1503   PROT 6.9 12/11/2023 1503   ALBUMIN 4.4 12/11/2023 1503   AST 21 12/11/2023 1503   ALT 16 12/11/2023 1503   ALKPHOS 58 12/11/2023 1503   BILITOT 0.6 12/11/2023 1503   GFRNONAA 58 (L) 11/07/2019 1229   GFRNONAA 56 (L) 03/08/2019 1540   GFRAA 67 11/07/2019 1229   GFRAA 65 03/08/2019 1540     Diabetic Labs (most recent): Lab Results  Component Value Date   HGBA1C 5.4 03/17/2023   HGBA1C 5.2 06/13/2022   HGBA1C 5.3 11/07/2019     Lipid Panel ( most recent) Lipid Panel     Component Value Date/Time   CHOL 128 03/17/2023 1401   TRIG 112 03/17/2023 1401   HDL 58 03/17/2023 1401   CHOLHDL 2.2 03/17/2023 1401   CHOLHDL 2.1 07/02/2021 1310   LDLCALC 50  03/17/2023 1401   LDLCALC 39 07/02/2021 1310   LABVLDL 20 03/17/2023 1401      Lab Results  Component Value Date   TSH 3.080 06/13/2022   TSH 1.190 11/07/2019   TSH 3.350 03/23/2019   TSH 3.060 06/04/2015   FREET4 0.89 06/04/2015     July 02, 2021 labs: Calcium  9.9, BUN 12, creatinine 1.14, albumin 4.4, PTH 90.  Jun 01, 2021 vitamin B12 430.  Recent Results (from the past 2160 hours)  HM MAMMOGRAPHY     Status: None   Collection Time: 12/09/23  8:06 AM  Result Value Ref Range   HM Mammogram 0-4 Bi-Rad 0-4 Bi-Rad, Self Reported Normal    Comment: Abstracted by HIM  Comprehensive metabolic panel with GFR     Status: Abnormal   Collection Time: 12/11/23  3:03 PM  Result Value Ref Range   Glucose 76 70 - 99 mg/dL   BUN 16 8 - 27 mg/dL   Creatinine, Ser 8.67 (H) 0.57 - 1.00 mg/dL   eGFR 45 (L) >40 fO/fpw/8.26   BUN/Creatinine Ratio 12 12 - 28   Sodium 143 134 - 144 mmol/L   Potassium 5.4 (H) 3.5 - 5.2 mmol/L   Chloride 105 96 - 106 mmol/L   CO2 24 20 - 29 mmol/L   Calcium  9.9 8.7 - 10.3 mg/dL   Total Protein 6.9 6.0 - 8.5 g/dL   Albumin 4.4 3.9 - 4.9 g/dL   Globulin, Total 2.5 1.5 - 4.5 g/dL   Bilirubin Total 0.6 0.0 - 1.2 mg/dL   Alkaline Phosphatase 58 49 - 135 IU/L   AST 21 0 - 40 IU/L   ALT 16 0 - 32 IU/L  PTH, intact and calcium      Status: None   Collection Time: 12/11/23  3:03 PM  Result Value Ref Range   PTH 47 15 - 65 pg/mL   PTH Interp Comment     Comment: Interpretation                 Intact PTH    Calcium                                  (pg/mL)      (mg/dL) Normal                          15 - 65     8.6 - 10.2 Primary Hyperparathyroidism         >65          >  10.2 Secondary Hyperparathyroidism       >65          <10.2 Non-Parathyroid  Hypercalcemia       <65          >10.2 Hypoparathyroidism                  <15          < 8.6 Non-Parathyroid  Hypocalcemia    15 - 65          < 8.6   Calcium , urine, 24 hour     Status: None   Collection Time: 12/11/23   3:07 PM  Result Value Ref Range   Calcium , Urine 11.6 Not Estab. mg/dL   Calcium , 24H Urine 70 0 - 320 mg/24 hr  Creatinine, urine, 24 hour     Status: None   Collection Time: 12/11/23  3:11 PM  Result Value Ref Range   Creatinine, Urine 147.2 Not Estab. mg/dL   Creatinine, 75Y Ur 116 800 - 1,800 mg/24 hr   Bone density on September 30, 2021: AP Spine L1-L4 09/30/2021 64.0 Normal 0.3 1.214 g/cm2 -   DualFemur Neck Left 09/30/2021 64.0 Osteopenia -2.1 0.743 g/cm2 - -   DualFemur Total Mean 09/30/2021 64.0 Normal -0.9 0.898 g/cm2 - -   Left Forearm Radius 33% 09/30/2021 64.0 Normal 0.6 0.757 g/cm2 - - ASSESSMENT: The BMD measured at Femur Neck Left is 0.743 g/cm2 with a T-score of -2.1. This patient is considered osteopenic according to World Health Organization [facility]) criteria. The scan quality is good.  Assessment: 1. Hyperparathyroidism 2.  Parathyroid  adenoma 3.  Osteopenia of hip  Plan: I reviewed her new and existing labs/bone density results with her and her husband.   The major finding has been documentation of high PTH 90 on the background of mild CKD without significant hypercalcemia nor hypercalciuria.   In her repeat previsit labs show calcium  stable at 9.9 associated with PTH of 47.  She has osteopenia of hip and history of nephrolithiasis.  Her DEXA scan in November 2025 indicates similar findings compared to her last study in 2023.  -A diagnosis of primary hyperparathyroidism is still possible due to her positive nuclear medicine scan showing possible right superior parathyroid  adenoma. -However, she does not have enough criteria for surgical intervention at this time. Reports history of calcium  based  nephrolithiasis.  She wishes to delay surgical intervention. Given absence of hypercalciuria, she will be on expectant management for until next measurement of PTH/calcium , 24-hour urine calcium  as well as repeat bone density.    - I discussed with the patient about  the physiology of calcium  and parathyroid  hormone, and possible  effects of  increased PTH/ Calcium  , including kidney stones, cardiac dysrhythmias, osteoporosis, abdominal pain, etc.    If her results are next visit confirm primary hyperparathyroidism,  she will be offered surgical treatment.   Her other option is intervention with Sensipar.  Her bone density indicates a T-score of -2.1 on left femoral neck, -1.8 on right femoral neck.  FRAX score showed major osteoporotic fracture of 10.9% and risk of hip fracture of 1.7%.  She is encouraged to continue with maintenance vitamin D .  She would not need bisphosphonate at this time.   She was offered discussion for lifestyle medicine, however she opted to delay it for now.   She is advised to maintain close follow-up follow-up with her PCP as well as nephrologist.   I spent  25  minutes in the care  of the patient today including review of labs from Thyroid  Function, CMP, and other relevant labs ; imaging/biopsy records (current and previous including abstractions from other facilities); face-to-face time discussing  her lab results and symptoms, medications doses, her options of short and long term treatment based on the latest standards of care / guidelines;   and documenting the encounter.  Adrien LITTIE Splinter  participated in the discussions, expressed understanding, and voiced agreement with the above plans.  All questions were answered to her satisfaction. she is encouraged to contact clinic should she have any questions or concerns prior to her return visit.   - Return in about 1 year (around 12/23/2024) for F/U with Pre-visit Labs.   Ranny Earl, MD [facility] [address] [place], [place] [address] Phone: [phone]  Fax: [phone]    This note was partially dictated with voice recognition software. Similar sounding words can be transcribed inadequately or may not  be corrected upon  review.  12/24/2023, 2:29 PM

## 2023-12-28 ENCOUNTER — Encounter: Payer: Self-pay | Admitting: Family Medicine

## 2023-12-28 DIAGNOSIS — N6323 Unspecified lump in the left breast, lower outer quadrant: Secondary | ICD-10-CM | POA: Diagnosis not present

## 2023-12-28 DIAGNOSIS — Z17411 Hormone receptor positive with human epidermal growth factor receptor 2 negative status: Secondary | ICD-10-CM | POA: Diagnosis not present

## 2023-12-28 DIAGNOSIS — C50512 Malignant neoplasm of lower-outer quadrant of left female breast: Secondary | ICD-10-CM | POA: Diagnosis not present

## 2023-12-29 ENCOUNTER — Encounter: Payer: Self-pay | Admitting: Family Medicine

## 2024-01-04 NOTE — Progress Notes (Signed)
 "    HPI: Follow-up dyspnea. Nuclear study 2018 showed ejection fraction 74% and normal perfusion.  Calcium  score April 2022 was 4 which was 50th percentile; aortic atherosclerosis noted.  Echocardiogram December 2024 showed normal LV function, grade 1 diastolic dysfunction, mild aortic insufficiency.  Since last seen she has some dyspnea on exertion unchanged.  No orthopnea, PND, pedal edema, chest pain or syncope.  Some dizziness with standing.  Current Outpatient Medications  Medication Sig Dispense Refill   ALPRAZolam  (XANAX ) 0.5 MG tablet Take 0.5-1 tablets (0.25-0.5 mg total) by mouth daily as needed for anxiety (PUT ON FILE). May take extra tablet during the day prn panic. 45 tablet 5   busPIRone  (BUSPAR ) 10 MG tablet Take 2 tablets (20 mg total) by mouth 2 (two) times daily. 360 tablet 4   carvedilol  (COREG ) 6.25 MG tablet TAKE 1 TABLET BY MOUTH TWICE DAILY WITH MEALS 60 tablet 0   Cholecalciferol (VITAMIN D ) 50 MCG (2000 UT) CAPS Take 2,000 Units by mouth daily with lunch.     desloratadine  (CLARINEX ) 5 MG tablet TAKE ONE (1) TABLET BY MOUTH EVERY DAY 90 tablet 1   famotidine  (PEPCID ) 20 MG tablet Take one tablet (20mg ) by mouth twice a day 180 tablet 3   hyoscyamine  (LEVSIN  SL) 0.125 MG SL tablet DISSOLVE 1 TABLET UNDER THE TONGUE EVERY 6 HOURS AS NEEDED 30 tablet PRN   Lactobacillus (PROBIOTIC ACIDOPHILUS PO) Take 1 tablet by mouth daily.     losartan  (COZAAR ) 100 MG tablet Take 1 tablet (100 mg total) by mouth daily. 90 tablet 4   potassium citrate  (UROCIT-K ) 10 MEQ (1080 MG) SR tablet Take 1 tablet (10 mEq total) by mouth in the morning and at bedtime. Renewal requests to Dr Rachele in [place]     rosuvastatin  (CRESTOR ) 20 MG tablet TAKE ONE (1) TABLET BY MOUTH EVERY DAY 30 tablet 0   tirzepatide  (ZEPBOUND ) 15 MG/0.5ML Pen Inject 15 mg into the skin once a week. 6 mL 4   Vilazodone  HCl (VIIBRYD ) 40 MG TABS Take 1 tablet (40 mg total) by mouth daily. 90 tablet 4   acetaminophen   (TYLENOL ) 325 MG tablet Take 650 mg by mouth every 6 (six) hours as needed for mild pain or moderate pain.      diphenhydrAMINE (BENADRYL) 25 mg capsule Take by mouth.     Omega-3 Fatty Acids (FISH OIL PO) Take 1 capsule by mouth daily.     triamcinolone  cream (KENALOG ) 0.1 % Apply 1 Application topically 2 (two) times daily as needed. 30 g 0   Current Facility-Administered Medications  Medication Dose Route Frequency Provider Last Rate Last Admin   betamethasone  acetate-betamethasone  sodium phosphate  (CELESTONE ) injection 6 mg  6 mg Intramuscular Once          Past Medical History:  Diagnosis Date   Allergy     Anemia    Anxiety    Chronic rhinitis    evaluated by dr y. luke ([facility]) note in epic 11-24-2019   Depression    DOE (dyspnea on exertion) 04-05-2020  per pt occasional gets sob sitting, lying, getting up and down, and with exertion   04-05-2020 pt pcp sent to cardiologist,  dr pietro for evalution, note in epic 03-28-2020 (ordered echo and cardiac CT and they have been scheduled   Endometrial polyp    Family history of bladder cancer    Family history of breast cancer    Family history of lung cancer    Family history  of prostate cancer    Family history of stomach cancer    First degree heart block    GERD (gastroesophageal reflux disease)    Headache    Heart murmur    History of adenomatous polyp of colon    History of kidney stones    History of palpitations    previous had event monitor 2017 showed SR/ first degree heart block, no arrhythmia's and normal echo   History of seizure    ED visit 08-22-2014 in epic for possible seizure, per ed note adverse reaction to prednisone  stimulant    Hyperlipidemia    Hypertension    followed by pcp   and cardiology (nuclear stress test 01-11- 2018 in epic, normal perfusion no ischemia with normal LV function and wall motion, nuclear ef 74%)   IBS (irritable bowel syndrome)    Nephrolithiasis    04-05-2020  per pt bilateral nonobstructive   OSA on CPAP    study in epic 08-31-1998 mild osa, recommended cpap   PMB (postmenopausal bleeding)    Refusal of blood transfusions as patient is Jehovah's Witness    Right ureteral calculus 11/07/2015   S/P herniorrhaphy 07/31/2014   Seizures (HCC)    Sleep apnea    SUI (stress urinary incontinence, female)     Past Surgical History:  Procedure Laterality Date   APPENDECTOMY     BREAST SURGERY  1974   lumpectomy, per pt benign   COLON SURGERY     CYSTOSCOPY/RETROGRADE/URETEROSCOPY  02-01-2008  @[facility]    CYSTOSCOPY/URETEROSCOPY/HOLMIUM LASER/STENT PLACEMENT  2008;  11-07-2015 @[facility]    HERNIA REPAIR     HYSTEROSCOPY WITH D & C N/A 04/09/2020   Procedure: DILATATION AND CURETTAGE /HYSTEROSCOPY;  Surgeon: Jannis Kate Norris, MD;  Location: [facility];  Service: Gynecology;  Laterality: N/A;   LAPAROSCOPIC CHOLECYSTECTOMY  2014   LAPAROSCOPIC ILEOCECECTOMY  09/07/2012   @ [facility]   W/  APPENDECTOMY (done for large cecal polyp, non-malignant)   OVARIAN CYST SURGERY  1988   unsure which side   TONSILLECTOMY  05/1973   VENTRAL HERNIA REPAIR  07-31-2014   @[facility]    incisional  (open)    Social History   Socioeconomic History   Marital status: Married    Spouse name: Not on file   Number of children: 1   Years of education: Not on file   Highest education level: 12th grade  Occupational History   Not on file  Tobacco Use   Smoking status: Never   Smokeless tobacco: Never  Vaping Use   Vaping status: Never Used  Substance and Sexual Activity   Alcohol use: Not Currently    Comment: Rare   Drug use: Never   Sexual activity: Not Currently    Birth control/protection: None  Other Topics Concern   Not on file  Social History Narrative   Not on file   Social Drivers of Health   Tobacco Use: Low Risk (01/13/2024)   Patient History    Smoking Tobacco Use: Never    Smokeless Tobacco Use: Never    Passive Exposure: Not on file   Financial Resource Strain: Low Risk (07/08/2023)   Overall Financial Resource Strain (CARDIA)    Difficulty of Paying Living Expenses: Not hard at all  Food Insecurity: No Food Insecurity (07/08/2023)   Epic    Worried About Radiation Protection Practitioner of Food in the Last Year: Never true    Ran Out of Food in the Last Year: Never true  Transportation Needs:  No Transportation Needs (07/08/2023)   Epic    Lack of Transportation (Medical): No    Lack of Transportation (Non-Medical): No  Physical Activity: Inactive (07/08/2023)   Exercise Vital Sign    Days of Exercise per Week: 0 days    Minutes of Exercise per Session: Not on file  Stress: Stress Concern Present (07/08/2023)   [employer] of Occupational Health - Occupational Stress Questionnaire    Feeling of Stress: Very much  Social Connections: Socially Integrated (07/08/2023)   Social Connection and Isolation Panel    Frequency of Communication with Friends and Family: Three times a week    Frequency of Social Gatherings with Friends and Family: More than three times a week    Attends Religious Services: More than 4 times per year    Active Member of Clubs or Organizations: Yes    Attends Engineer, Structural: More than 4 times per year    Marital Status: Married  Catering Manager Violence: Not on file  Depression (PHQ2-9): High Risk (07/20/2023)   Depression (PHQ2-9)    PHQ-2 Score: 20  Alcohol Screen: Low Risk (12/25/2022)   Alcohol Screen    Last Alcohol Screening Score (AUDIT): 1  Housing: Unknown (07/08/2023)   Epic    Unable to Pay for Housing in the Last Year: No    Number of Times Moved in the Last Year: Not on file    Homeless in the Last Year: No  Utilities: Not on file  Health Literacy: Not on file    Family History  Problem Relation Age of Onset   Cancer Mother    Aneurysm Mother    Allergic rhinitis Mother    Breast cancer Mother 37       right breast cancer at 94, R mastectomy. L breast cancer at 69   Atrial  fibrillation Mother    Skin cancer Mother 44       skin cancer of ear at 66, part of ear removed. Skin cancer of side of face at 60, removed   Stomach cancer Father 65       in army, Agent Orange exposure   Hypertension Father    Alzheimer's disease Father    CAD Father    Bladder Cancer Father 32       in army, Agent Orange exposure   Prostate cancer Father 51       in army, Agent Orange exposure   Allergic rhinitis Brother    COPD Paternal Aunt    Cancer Paternal Aunt 60       Blood cancer   Prostate cancer Paternal Uncle 58   Lung cancer Maternal Grandfather        worked in haematologist mines   Eczema Son    Bronchitis Son    Sinusitis Son    Asthma Neg Hx    Urticaria Neg Hx    Immunodeficiency Neg Hx    Atopy Neg Hx    Angioedema Neg Hx     ROS: no fevers or chills, productive cough, hemoptysis, dysphasia, odynophagia, melena, hematochezia, dysuria, hematuria, rash, seizure activity, orthopnea, PND, pedal edema, claudication. Remaining systems are negative.  Physical Exam: Well-developed well-nourished in no acute distress.  Skin is warm and dry.  HEENT is normal.  Neck is supple.  Chest is clear to auscultation with normal expansion.  Cardiovascular exam is regular rate and rhythm.  Abdominal exam nontender or distended. No masses palpated. Extremities show no edema. neuro grossly intact  EKG Interpretation  Date/Time:  Wednesday January 13 2024 08:34:16 EST Ventricular Rate:  87 PR Interval:  186 QRS Duration:  80 QT Interval:  360 QTC Calculation: 433 R Axis:   -14  Text Interpretation: Sinus rhythm with frequent Premature ventricular complexes Cannot rule out Anterior infarct Confirmed by Pietro Rogue ([id number]) on 01/13/2024 8:35:29 AM    A/P  1 dyspnea-LV function normal on previous echocardiogram and minimally elevated calcium  score.  Dyspnea is felt likely secondary to a combination of of history of sleep apnea and obesity hypoventilation syndrome.  2  coronary calcification-continue statin.  3 hypertension-blood pressure is controlled.  However she states she has had some dizziness with standing.  She has lost 40 pounds recently which is likely contributing.  Decrease losartan  to 50 mg daily and follow.  If symptoms persist may need to discontinue completely.  4 hyperlipidemia-continue statin.  Most recent LDL 50.  5 obesity-patient has lost 40 pounds recently with GLP-1 agonist.  Rogue Pietro, MD    "

## 2024-01-11 ENCOUNTER — Encounter: Payer: Self-pay | Admitting: Family Medicine

## 2024-01-13 ENCOUNTER — Encounter: Payer: Self-pay | Admitting: Cardiology

## 2024-01-13 ENCOUNTER — Ambulatory Visit: Attending: Cardiology | Admitting: Cardiology

## 2024-01-13 VITALS — BP 110/76 | HR 87 | Ht 65.0 in | Wt 229.0 lb

## 2024-01-13 DIAGNOSIS — E7800 Pure hypercholesterolemia, unspecified: Secondary | ICD-10-CM | POA: Diagnosis not present

## 2024-01-13 DIAGNOSIS — R0609 Other forms of dyspnea: Secondary | ICD-10-CM | POA: Diagnosis not present

## 2024-01-13 DIAGNOSIS — I10 Essential (primary) hypertension: Secondary | ICD-10-CM | POA: Diagnosis not present

## 2024-01-13 MED ORDER — LOSARTAN POTASSIUM 50 MG PO TABS: 50.0000 mg | ORAL_TABLET | Freq: Every day | ORAL | 3 refills | Status: AC

## 2024-01-13 MED ORDER — CARVEDILOL 6.25 MG PO TABS: 6.2500 mg | ORAL_TABLET | Freq: Two times a day (BID) | ORAL | 3 refills | Status: AC

## 2024-01-13 MED ORDER — ROSUVASTATIN CALCIUM 20 MG PO TABS: 20.0000 mg | ORAL_TABLET | Freq: Every day | ORAL | 3 refills | Status: AC

## 2024-01-13 NOTE — Patient Instructions (Signed)
 Medication Instructions:  Your physician has recommended you make the following change in your medication:  DECREASE: losartan  to 50 mg by mouth once daily  *If you need a refill on your cardiac medications before your next appointment, please call your pharmacy*  Lab Work: NONE If you have labs (blood work) drawn today and your tests are completely normal, you will receive your results only by: MyChart Message (if you have MyChart) OR A paper copy in the mail If you have any lab test that is abnormal or we need to change your treatment, we will call you to review the results.  Testing/Procedures: NONE  Follow-Up: At [facility], you and your health needs are our priority.  As part of our continuing mission to provide you with exceptional heart care, our providers are all part of one team.  This team includes your primary Cardiologist (physician) and Advanced Practice Providers or APPs (Physician Assistants and Nurse Practitioners) who all work together to provide you with the care you need, when you need it.  Your next appointment:   1 year(s)  Provider:   Redell Shallow, MD

## 2024-02-02 ENCOUNTER — Encounter: Payer: Self-pay | Admitting: Family Medicine

## 2024-02-09 ENCOUNTER — Other Ambulatory Visit (HOSPITAL_COMMUNITY): Payer: Self-pay

## 2024-02-09 NOTE — Telephone Encounter (Signed)
 Test claim shows Zepbound  15mg  last fill date 02/08/24.   PA expires 01/12/25

## 2024-02-10 ENCOUNTER — Encounter: Payer: Self-pay | Admitting: Family Medicine

## 2024-02-15 ENCOUNTER — Other Ambulatory Visit: Payer: Self-pay | Admitting: Family Medicine

## 2024-02-15 DIAGNOSIS — F5104 Psychophysiologic insomnia: Secondary | ICD-10-CM

## 2024-02-15 DIAGNOSIS — F411 Generalized anxiety disorder: Secondary | ICD-10-CM

## 2024-02-16 ENCOUNTER — Telehealth (INDEPENDENT_AMBULATORY_CARE_PROVIDER_SITE_OTHER): Admitting: Family Medicine

## 2024-02-16 ENCOUNTER — Encounter: Payer: Self-pay | Admitting: Family Medicine

## 2024-02-16 VITALS — BP 107/75 | HR 83 | Temp 96.9°F | Ht 65.0 in | Wt 227.0 lb

## 2024-02-16 DIAGNOSIS — B9689 Other specified bacterial agents as the cause of diseases classified elsewhere: Secondary | ICD-10-CM

## 2024-02-16 DIAGNOSIS — F411 Generalized anxiety disorder: Secondary | ICD-10-CM | POA: Diagnosis not present

## 2024-02-16 DIAGNOSIS — F5104 Psychophysiologic insomnia: Secondary | ICD-10-CM

## 2024-02-16 DIAGNOSIS — R430 Anosmia: Secondary | ICD-10-CM

## 2024-02-16 DIAGNOSIS — J019 Acute sinusitis, unspecified: Secondary | ICD-10-CM | POA: Diagnosis not present

## 2024-02-16 LAB — VERITOR SARS-COV-2 AND FLU A+B
BD Veritor SARS-CoV-2 Ag: NEGATIVE
Influenza A: NEGATIVE
Influenza B: NEGATIVE

## 2024-02-16 MED ORDER — DOXYCYCLINE HYCLATE 100 MG PO TABS: 100.0000 mg | ORAL_TABLET | Freq: Two times a day (BID) | ORAL | 0 refills | Status: AC

## 2024-02-16 MED ORDER — ALPRAZOLAM 0.5 MG PO TABS: 0.2500 mg | ORAL_TABLET | Freq: Every day | ORAL | 5 refills | Status: AC | PRN

## 2024-02-16 MED ORDER — BENZONATATE 200 MG PO CAPS: 200.0000 mg | ORAL_CAPSULE | Freq: Two times a day (BID) | ORAL | 0 refills | Status: AC | PRN

## 2024-02-16 MED ORDER — FLUCONAZOLE 150 MG PO TABS: 150.0000 mg | ORAL_TABLET | Freq: Once | ORAL | 0 refills | Status: AC

## 2024-02-16 NOTE — Patient Instructions (Signed)

## 2024-02-17 ENCOUNTER — Ambulatory Visit

## 2024-03-21 ENCOUNTER — Ambulatory Visit: Payer: Self-pay

## 2024-08-26 ENCOUNTER — Ambulatory Visit

## 2024-12-28 ENCOUNTER — Ambulatory Visit: Admitting: "Endocrinology
